# Patient Record
Sex: Female | Born: 1955
Health system: Southern US, Community
[De-identification: ages and names within clinical notes are randomized; demographics above are authoritative.]

## PROBLEM LIST (undated history)

## (undated) VITALS — BP 137/90 | HR 99 | Temp 98.1°F | Resp 16 | Ht 67.0 in | Wt 263.0 lb

## (undated) DIAGNOSIS — M545 Low back pain, unspecified: Secondary | ICD-10-CM

## (undated) DIAGNOSIS — G4730 Sleep apnea, unspecified: Secondary | ICD-10-CM

## (undated) DIAGNOSIS — F32A Depression, unspecified: Secondary | ICD-10-CM

## (undated) DIAGNOSIS — K209 Esophagitis, unspecified without bleeding: Secondary | ICD-10-CM

## (undated) DIAGNOSIS — M797 Fibromyalgia: Secondary | ICD-10-CM

## (undated) DIAGNOSIS — K219 Gastro-esophageal reflux disease without esophagitis: Secondary | ICD-10-CM

## (undated) DIAGNOSIS — G43909 Migraine, unspecified, not intractable, without status migrainosus: Secondary | ICD-10-CM

## (undated) DIAGNOSIS — E785 Hyperlipidemia, unspecified: Secondary | ICD-10-CM

## (undated) DIAGNOSIS — Z9989 Dependence on other enabling machines and devices: Secondary | ICD-10-CM

## (undated) DIAGNOSIS — K3184 Gastroparesis: Secondary | ICD-10-CM

## (undated) DIAGNOSIS — G8929 Other chronic pain: Secondary | ICD-10-CM

## (undated) DIAGNOSIS — F410 Panic disorder [episodic paroxysmal anxiety] without agoraphobia: Secondary | ICD-10-CM

## (undated) DIAGNOSIS — F329 Major depressive disorder, single episode, unspecified: Secondary | ICD-10-CM

## (undated) DIAGNOSIS — M4800 Spinal stenosis, site unspecified: Secondary | ICD-10-CM

## (undated) DIAGNOSIS — F319 Bipolar disorder, unspecified: Secondary | ICD-10-CM

## (undated) DIAGNOSIS — R51 Headache: Secondary | ICD-10-CM

## (undated) DIAGNOSIS — G4733 Obstructive sleep apnea (adult) (pediatric): Secondary | ICD-10-CM

## (undated) DIAGNOSIS — R251 Tremor, unspecified: Secondary | ICD-10-CM

## (undated) DIAGNOSIS — F909 Attention-deficit hyperactivity disorder, unspecified type: Secondary | ICD-10-CM

## (undated) DIAGNOSIS — I10 Essential (primary) hypertension: Secondary | ICD-10-CM

## (undated) DIAGNOSIS — F419 Anxiety disorder, unspecified: Secondary | ICD-10-CM

## (undated) DIAGNOSIS — B3781 Candidal esophagitis: Secondary | ICD-10-CM

## (undated) DIAGNOSIS — E039 Hypothyroidism, unspecified: Secondary | ICD-10-CM

## (undated) DIAGNOSIS — R519 Headache, unspecified: Secondary | ICD-10-CM

## (undated) DIAGNOSIS — K589 Irritable bowel syndrome without diarrhea: Secondary | ICD-10-CM

## (undated) DIAGNOSIS — R7303 Prediabetes: Secondary | ICD-10-CM

## (undated) DIAGNOSIS — M069 Rheumatoid arthritis, unspecified: Secondary | ICD-10-CM

## (undated) DIAGNOSIS — E079 Disorder of thyroid, unspecified: Secondary | ICD-10-CM

## (undated) DIAGNOSIS — R0600 Dyspnea, unspecified: Secondary | ICD-10-CM

## (undated) HISTORY — DX: Spinal stenosis, site unspecified: M48.00

## (undated) HISTORY — DX: Essential (primary) hypertension: I10

## (undated) HISTORY — PX: TUBAL LIGATION: SHX77

## (undated) HISTORY — DX: Esophagitis, unspecified without bleeding: K20.90

## (undated) HISTORY — DX: Gastroparesis: K31.84

## (undated) HISTORY — DX: Major depressive disorder, single episode, unspecified: F32.9

## (undated) HISTORY — PX: LUMBAR DISC SURGERY: SHX700

## (undated) HISTORY — PX: DILATION AND CURETTAGE OF UTERUS: SHX78

## (undated) HISTORY — DX: Irritable bowel syndrome, unspecified: K58.9

## (undated) HISTORY — DX: Candidal esophagitis: B37.81

## (undated) HISTORY — DX: Anxiety disorder, unspecified: F41.9

## (undated) HISTORY — DX: Panic disorder (episodic paroxysmal anxiety): F41.0

## (undated) HISTORY — DX: Esophagitis, unspecified: K20.9

## (undated) HISTORY — DX: Depression, unspecified: F32.A

## (undated) HISTORY — DX: Disorder of thyroid, unspecified: E07.9

## (undated) HISTORY — PX: TONSILLECTOMY: SUR1361

## (undated) HISTORY — PX: BREAST BIOPSY: SHX20

## (undated) HISTORY — DX: Hyperlipidemia, unspecified: E78.5

## (undated) HISTORY — DX: Gastro-esophageal reflux disease without esophagitis: K21.9

---

## 1998-09-05 ENCOUNTER — Other Ambulatory Visit: Admission: RE | Admit: 1998-09-05 | Discharge: 1998-09-05 | Payer: Self-pay | Admitting: *Deleted

## 1998-11-04 ENCOUNTER — Ambulatory Visit (HOSPITAL_COMMUNITY): Admission: RE | Admit: 1998-11-04 | Discharge: 1998-11-04 | Payer: Self-pay | Admitting: Internal Medicine

## 1998-11-04 ENCOUNTER — Encounter: Payer: Self-pay | Admitting: Internal Medicine

## 1998-11-14 ENCOUNTER — Encounter: Payer: Self-pay | Admitting: Internal Medicine

## 1998-11-14 ENCOUNTER — Ambulatory Visit (HOSPITAL_COMMUNITY): Admission: RE | Admit: 1998-11-14 | Discharge: 1998-11-14 | Payer: Self-pay | Admitting: Internal Medicine

## 2000-03-14 ENCOUNTER — Encounter: Admission: RE | Admit: 2000-03-14 | Discharge: 2000-03-14 | Payer: Self-pay | Admitting: *Deleted

## 2000-03-14 ENCOUNTER — Other Ambulatory Visit: Admission: RE | Admit: 2000-03-14 | Discharge: 2000-03-14 | Payer: Self-pay

## 2000-03-14 ENCOUNTER — Encounter (INDEPENDENT_AMBULATORY_CARE_PROVIDER_SITE_OTHER): Payer: Self-pay | Admitting: *Deleted

## 2001-05-04 HISTORY — PX: LAPAROSCOPIC CHOLECYSTECTOMY: SUR755

## 2001-05-24 ENCOUNTER — Encounter (INDEPENDENT_AMBULATORY_CARE_PROVIDER_SITE_OTHER): Payer: Self-pay | Admitting: *Deleted

## 2001-05-24 ENCOUNTER — Inpatient Hospital Stay (HOSPITAL_COMMUNITY): Admission: EM | Admit: 2001-05-24 | Discharge: 2001-05-25 | Payer: Self-pay | Admitting: Emergency Medicine

## 2001-05-24 ENCOUNTER — Encounter: Payer: Self-pay | Admitting: Emergency Medicine

## 2001-12-02 HISTORY — PX: LIPOMA EXCISION: SHX5283

## 2001-12-25 ENCOUNTER — Encounter (INDEPENDENT_AMBULATORY_CARE_PROVIDER_SITE_OTHER): Payer: Self-pay | Admitting: *Deleted

## 2001-12-25 ENCOUNTER — Ambulatory Visit (HOSPITAL_BASED_OUTPATIENT_CLINIC_OR_DEPARTMENT_OTHER): Admission: RE | Admit: 2001-12-25 | Discharge: 2001-12-25 | Payer: Self-pay | Admitting: *Deleted

## 2002-02-03 ENCOUNTER — Encounter: Admission: RE | Admit: 2002-02-03 | Discharge: 2002-02-03 | Payer: Self-pay | Admitting: Family Medicine

## 2002-02-03 ENCOUNTER — Encounter: Payer: Self-pay | Admitting: Family Medicine

## 2002-03-03 ENCOUNTER — Other Ambulatory Visit: Admission: RE | Admit: 2002-03-03 | Discharge: 2002-03-03 | Payer: Self-pay | Admitting: Obstetrics and Gynecology

## 2002-04-24 ENCOUNTER — Encounter: Payer: Self-pay | Admitting: Internal Medicine

## 2002-04-24 ENCOUNTER — Encounter: Admission: RE | Admit: 2002-04-24 | Discharge: 2002-04-24 | Payer: Self-pay | Admitting: Internal Medicine

## 2002-05-17 ENCOUNTER — Emergency Department (HOSPITAL_COMMUNITY): Admission: EM | Admit: 2002-05-17 | Discharge: 2002-05-18 | Payer: Self-pay | Admitting: Physical Therapy

## 2002-07-07 ENCOUNTER — Encounter: Admission: RE | Admit: 2002-07-07 | Discharge: 2002-07-07 | Payer: Self-pay | Admitting: Internal Medicine

## 2002-07-07 ENCOUNTER — Encounter: Payer: Self-pay | Admitting: Internal Medicine

## 2003-06-07 ENCOUNTER — Encounter: Payer: Self-pay | Admitting: Internal Medicine

## 2003-06-07 ENCOUNTER — Ambulatory Visit (HOSPITAL_COMMUNITY): Admission: RE | Admit: 2003-06-07 | Discharge: 2003-06-07 | Payer: Self-pay | Admitting: Internal Medicine

## 2003-10-27 ENCOUNTER — Emergency Department (HOSPITAL_COMMUNITY): Admission: EM | Admit: 2003-10-27 | Discharge: 2003-10-28 | Payer: Self-pay | Admitting: Emergency Medicine

## 2004-04-20 ENCOUNTER — Encounter: Admission: RE | Admit: 2004-04-20 | Discharge: 2004-04-20 | Payer: Self-pay | Admitting: Family Medicine

## 2004-04-25 ENCOUNTER — Other Ambulatory Visit: Admission: RE | Admit: 2004-04-25 | Discharge: 2004-04-25 | Payer: Self-pay | Admitting: Obstetrics and Gynecology

## 2004-05-05 ENCOUNTER — Encounter (INDEPENDENT_AMBULATORY_CARE_PROVIDER_SITE_OTHER): Payer: Self-pay | Admitting: Specialist

## 2004-05-05 ENCOUNTER — Encounter: Admission: RE | Admit: 2004-05-05 | Discharge: 2004-05-05 | Payer: Self-pay | Admitting: Family Medicine

## 2005-02-16 ENCOUNTER — Encounter: Admission: RE | Admit: 2005-02-16 | Discharge: 2005-02-16 | Payer: Self-pay | Admitting: Sports Medicine

## 2005-03-09 ENCOUNTER — Encounter: Admission: RE | Admit: 2005-03-09 | Discharge: 2005-03-09 | Payer: Self-pay | Admitting: Sports Medicine

## 2005-03-26 ENCOUNTER — Ambulatory Visit: Payer: Self-pay | Admitting: Family Medicine

## 2005-05-24 ENCOUNTER — Other Ambulatory Visit: Admission: RE | Admit: 2005-05-24 | Discharge: 2005-05-24 | Payer: Self-pay | Admitting: Obstetrics and Gynecology

## 2005-05-24 ENCOUNTER — Encounter: Admission: RE | Admit: 2005-05-24 | Discharge: 2005-05-24 | Payer: Self-pay | Admitting: Family Medicine

## 2005-06-15 ENCOUNTER — Encounter: Admission: RE | Admit: 2005-06-15 | Discharge: 2005-06-15 | Payer: Self-pay | Admitting: Family Medicine

## 2005-12-28 ENCOUNTER — Ambulatory Visit: Payer: Self-pay | Admitting: Internal Medicine

## 2006-01-08 ENCOUNTER — Ambulatory Visit: Payer: Self-pay | Admitting: Internal Medicine

## 2006-01-24 ENCOUNTER — Ambulatory Visit (HOSPITAL_BASED_OUTPATIENT_CLINIC_OR_DEPARTMENT_OTHER): Admission: RE | Admit: 2006-01-24 | Discharge: 2006-01-24 | Payer: Self-pay | Admitting: Orthopedic Surgery

## 2006-01-24 HISTORY — PX: KNEE ARTHROSCOPY: SHX127

## 2006-03-06 ENCOUNTER — Emergency Department (HOSPITAL_COMMUNITY): Admission: EM | Admit: 2006-03-06 | Discharge: 2006-03-06 | Payer: Self-pay | Admitting: Emergency Medicine

## 2006-07-11 ENCOUNTER — Encounter: Admission: RE | Admit: 2006-07-11 | Discharge: 2006-07-11 | Payer: Self-pay | Admitting: Family Medicine

## 2007-04-03 ENCOUNTER — Other Ambulatory Visit: Admission: RE | Admit: 2007-04-03 | Discharge: 2007-04-03 | Payer: Self-pay | Admitting: Internal Medicine

## 2007-08-14 ENCOUNTER — Encounter: Admission: RE | Admit: 2007-08-14 | Discharge: 2007-08-14 | Payer: Self-pay | Admitting: Internal Medicine

## 2007-08-15 ENCOUNTER — Ambulatory Visit: Payer: Self-pay | Admitting: Internal Medicine

## 2007-08-19 ENCOUNTER — Ambulatory Visit: Payer: Self-pay | Admitting: Internal Medicine

## 2007-08-19 ENCOUNTER — Encounter: Payer: Self-pay | Admitting: Internal Medicine

## 2007-09-09 ENCOUNTER — Encounter: Admission: RE | Admit: 2007-09-09 | Discharge: 2007-09-09 | Payer: Self-pay | Admitting: Neurosurgery

## 2007-10-06 ENCOUNTER — Telehealth: Payer: Self-pay | Admitting: Internal Medicine

## 2008-03-29 ENCOUNTER — Other Ambulatory Visit: Admission: RE | Admit: 2008-03-29 | Discharge: 2008-03-29 | Payer: Self-pay | Admitting: Internal Medicine

## 2008-08-27 ENCOUNTER — Encounter: Admission: RE | Admit: 2008-08-27 | Discharge: 2008-08-27 | Payer: Self-pay | Admitting: Internal Medicine

## 2008-11-08 ENCOUNTER — Emergency Department (HOSPITAL_COMMUNITY): Admission: EM | Admit: 2008-11-08 | Discharge: 2008-11-08 | Payer: Self-pay | Admitting: Emergency Medicine

## 2008-12-02 HISTORY — PX: POSTERIOR LUMBAR FUSION: SHX6036

## 2008-12-24 ENCOUNTER — Inpatient Hospital Stay (HOSPITAL_COMMUNITY): Admission: RE | Admit: 2008-12-24 | Discharge: 2008-12-29 | Payer: Self-pay | Admitting: Neurosurgery

## 2009-03-04 ENCOUNTER — Telehealth: Payer: Self-pay | Admitting: Internal Medicine

## 2009-03-08 DIAGNOSIS — Z8719 Personal history of other diseases of the digestive system: Secondary | ICD-10-CM | POA: Insufficient documentation

## 2009-03-08 DIAGNOSIS — M4800 Spinal stenosis, site unspecified: Secondary | ICD-10-CM | POA: Insufficient documentation

## 2009-03-08 DIAGNOSIS — F410 Panic disorder [episodic paroxysmal anxiety] without agoraphobia: Secondary | ICD-10-CM | POA: Insufficient documentation

## 2009-03-08 DIAGNOSIS — K219 Gastro-esophageal reflux disease without esophagitis: Secondary | ICD-10-CM | POA: Insufficient documentation

## 2009-03-08 DIAGNOSIS — E039 Hypothyroidism, unspecified: Secondary | ICD-10-CM | POA: Insufficient documentation

## 2009-03-08 DIAGNOSIS — I10 Essential (primary) hypertension: Secondary | ICD-10-CM | POA: Insufficient documentation

## 2009-03-08 DIAGNOSIS — F329 Major depressive disorder, single episode, unspecified: Secondary | ICD-10-CM | POA: Insufficient documentation

## 2009-03-08 DIAGNOSIS — B3781 Candidal esophagitis: Secondary | ICD-10-CM | POA: Insufficient documentation

## 2009-03-09 ENCOUNTER — Ambulatory Visit: Payer: Self-pay | Admitting: Internal Medicine

## 2009-03-10 ENCOUNTER — Encounter: Admission: RE | Admit: 2009-03-10 | Discharge: 2009-04-21 | Payer: Self-pay | Admitting: Neurosurgery

## 2009-03-31 ENCOUNTER — Other Ambulatory Visit: Admission: RE | Admit: 2009-03-31 | Discharge: 2009-03-31 | Payer: Self-pay | Admitting: Internal Medicine

## 2009-09-12 ENCOUNTER — Telehealth: Payer: Self-pay | Admitting: Internal Medicine

## 2009-10-10 ENCOUNTER — Ambulatory Visit: Payer: Self-pay | Admitting: Internal Medicine

## 2009-10-11 LAB — CONVERTED CEMR LAB
BUN: 13 mg/dL (ref 6–23)
TSH: 1.77 microintl units/mL (ref 0.35–5.50)
Total Bilirubin: 0.4 mg/dL (ref 0.3–1.2)

## 2009-10-13 ENCOUNTER — Ambulatory Visit: Payer: Self-pay | Admitting: Internal Medicine

## 2009-10-13 ENCOUNTER — Telehealth: Payer: Self-pay | Admitting: Internal Medicine

## 2010-03-29 ENCOUNTER — Encounter: Admission: RE | Admit: 2010-03-29 | Discharge: 2010-03-29 | Payer: Self-pay | Admitting: Internal Medicine

## 2010-04-10 ENCOUNTER — Other Ambulatory Visit: Admission: RE | Admit: 2010-04-10 | Discharge: 2010-04-10 | Payer: Self-pay | Admitting: Internal Medicine

## 2010-06-04 HISTORY — PX: CARDIAC CATHETERIZATION: SHX172

## 2010-06-24 ENCOUNTER — Encounter: Payer: Self-pay | Admitting: Family Medicine

## 2010-06-25 ENCOUNTER — Encounter: Payer: Self-pay | Admitting: Neurosurgery

## 2010-06-25 ENCOUNTER — Encounter: Payer: Self-pay | Admitting: Family Medicine

## 2010-07-06 NOTE — Progress Notes (Signed)
Summary: Triage  Medications Added ZOFRAN 4 MG  TABS (ONDANSETRON HCL) Take 1 every 6-8 hours as needed for nausea.       Phone Note Call from Patient Call back at [phone]   Caller: Patient Call For: Dr. Juanda Chance Reason for Call: Talk to Nurse Summary of Call: pt is nauseated  Initial call taken by: Karna Christmas,  September 12, 2009 9:53 AM  Follow-up for Phone Call        Last OV 03-09-09. Doesn't take Prilosec. Takes Reglan three times a day.  No Vicodin, just Tramadol. Pt. c/o 3 weeks of nausea, early satiety, bloating and "Sometimes I end up in the bed all day just eating saltine crackers"   DR.Chelcie Estorga PLEASE ADVISE  Follow-up by: Laureen Ochs LPN,  September 12, 2009 1:01 PM  Additional Follow-up for Phone Call Additional follow up Details #1::        I have reviewed her chart. Overweight, hx depression, last GES normal. please send Zofran 4mg ,#30, 1 by mouth q 6-8 hrs as needed nausea Additional Follow-up by: Hart Carwin MD,  September 12, 2009 3:28 PM    Additional Follow-up for Phone Call Additional follow up Details #2::    Above MD orders reviewed with patient. Pt. to keep scheduled office visit. 10-10-09 at 10:15am. Pt. instructed to call back as needed.   Laureen Ochs LPN  September 12, 2009 3:30 PM   New/Updated Medications: ZOFRAN 4 MG  TABS (ONDANSETRON HCL) Take 1 every 6-8 hours as needed for nausea. Prescriptions: ZOFRAN 4 MG  TABS (ONDANSETRON HCL) Take 1 every 6-8 hours as needed for nausea.  #30 x 0   Entered by:   Laureen Ochs LPN   Authorized by:   Hart Carwin MD   Signed by:   Laureen Ochs LPN on 04/54/0981   Method used:   Electronically to        [facility] [address] #[id number]* (retail)       [address]       [place], [place]  [address]       Ph: [phone]       Fax: [phone]   RxID:   [id number]

## 2010-07-06 NOTE — Progress Notes (Signed)
Summary: TRIAGE   Phone Note Call from Patient Call back at Home Phone [phone]   Caller: Patient Summary of Call: Patient would like to let Dr. Juanda Chance to know that her insurance does not cover any weight loss surgical procedures, Plus she lost her job in Jan., so she does not feel like she will be able to have any procedure done about her weight.  She would like to know what else she is supposed to do? Initial call taken by: Harlow Mares CMA Duncan Dull),  Oct 13, 2009 2:42 PM  Follow-up for Phone Call        DR.Rosa Gambale PLEASE ADVISE  Follow-up by: Laureen Ochs LPN,  Oct 13, 2009 2:53 PM  Additional Follow-up for Phone Call Additional follow up Details #1::        I told her we would try  Phenergan 12.5 mg, # 30 1 by mouth q 6 hrs as needed. Additional Follow-up by: Hart Carwin MD,  Oct 13, 2009 5:50 PM    Additional Follow-up for Phone Call Additional follow up Details #2::    I have spoken to the patient and have given her the possible option of sending her to a nutritionist. She states, "I know what to eat and how much to eat, I just dont have the willpower to stay away from stuff." Patient also explained that she was "up crying all last night" because her insurance will not cover a weight loss procedure. I have also asked her to follow up as directed with Dr Allena Katz re: her depression. She says that Dr Juanda Chance mentioned possibly using an appetite suppresant. What are your thoughts on this? Dottie Nelson-Smith CMA Duncan Dull)  Oct 14, 2009 9:43 AM   Additional Follow-up for Phone Call Additional follow up Details #3:: Details for Additional Follow-up Action Taken: Yes. we can try [place] Dospan 75 mg po once daily, # 30 , 2 refills. Additional Follow-up by: Hart Carwin MD,  Oct 14, 2009 12:33 PM   Appended Document: TRIAGE Patient advised that we have sent appetite suppresant to pharmacy. Dottie Nelson-Smith CMA (AAMA)  Oct 14, 2009 1:15 PM    Clinical Lists  Changes  Medications: Added new medication of * TENNUATE DOSPAN 75 MG TABLET Take 1 tablet by mouth once daily - Signed Rx of TENNUATE DOSPAN 75 MG TABLET Take 1 tablet by mouth once daily;  #30 x 2;  Signed;  Entered by: Lamona Curl CMA (AAMA);  Authorized by: Hart Carwin MD;  Method used: Printed then faxed to [facility] #[id number]*, [address] [place], [place], [place]  [address], Ph: [phone], Fax: [phone]    Prescriptions: TENNUATE DOSPAN 75 MG TABLET Take 1 tablet by mouth once daily  #30 x 2   Entered by:   Lamona Curl CMA (AAMA)   Authorized by:   Hart Carwin MD   Signed by:   Lamona Curl CMA (AAMA) on 10/14/2009   Method used:   Printed then faxed to ...       [facility] #[id number]* (retail)       [address]       [place], [place]  [address]       Ph: [phone]       Fax: [phone]   RxID:   [id number]

## 2010-07-06 NOTE — Assessment & Plan Note (Signed)
Summary: nausea////em   History of Present Illness Visit Type: Follow-up Visit Primary GI MD: Lina Sar MD Primary Provider: Renford Dills, MD Chief Complaint: ongoing nausea, pt is taking metoclopramide.  Pt states she ate ice cream yesterday and this upset her stomach History of Present Illness:   This is a 55 year old, white female with chronic nausea of at least 12 years duration. She takes Zofran for the nausea. A gastric emptying scan in 2000 showed a 90% retention in 2 hours. A repeat gastric emptying scan in 2005 was normal. She has depression and chronic weight gain. She has decreased her pain medications from hydrocodone  to tramadol 3 times a day. An upper endoscopy showed Candida esophagitis in March 2009. There is a history of a remote cholecystectomy in 2000. A colonoscopy in August 2007 was essentially normal. Patient is considering lapband surgery for obesity.   GI Review of Systems    Reports nausea.      Denies abdominal pain, acid reflux, belching, bloating, chest pain, dysphagia with liquids, dysphagia with solids, heartburn, loss of appetite, vomiting, vomiting blood, weight loss, and  weight gain.        Denies anal fissure, black tarry stools, change in bowel habit, constipation, diarrhea, diverticulosis, fecal incontinence, heme positive stool, hemorrhoids, irritable bowel syndrome, jaundice, light color stool, liver problems, rectal bleeding, and  rectal pain.    Current Medications (verified): 1)  Triamterene-Hctz 75-50 Mg Tabs (Triamterene-Hctz) .... Take 1/2 Tablet Daily 2)  Levothyroxine Sodium 100 Mcg Tabs (Levothyroxine Sodium) .... Once Daily 3)  Seroquel 50 Mg Tabs (Quetiapine Fumarate) .... Two Times A Day 4)  Ambien 10 Mg Tabs (Zolpidem Tartrate) .... Take 1 Tablet At Bedtime 5)  Multivitamins  Tabs (Multiple Vitamin) .... Once Daily 6)  Prilosec 20 Mg Cpdr (Omeprazole) .... Two Times A Day 7)  Tramadol Hcl 50 Mg Tabs (Tramadol Hcl) .... Take 1 Tablet  By Mouth Three Times A Day As Needed For Pain 8)  Reglan 10 Mg Tabs (Metoclopramide Hcl) .... Take 1 Tablet By Mouth Before Each Meal (Three Times Daily) 9)  Zofran 4 Mg  Tabs (Ondansetron Hcl) .... Take 1 Every 6-8 Hours As Needed For Nausea. 10)  Xanax 1 Mg Tabs (Alprazolam) .... Take 1 Tablet By Mouth Three Times A Day 11)  Aspirin 81 Mg Tabs (Aspirin) .... Take 1 Tablet By Mouth Once A Day 12)  Cymbalta 30 Mg Cpep (Duloxetine Hcl) .... Take 1 Tablet By Mouth Once A Day 13)  Vitamin B-12 100 Mcg Tabs (Cyanocobalamin) .... Take 1 Tablet By Mouth Once A Day  Allergies (verified): 1)  ! Buspar 2)  ! Zoloft  Past History:  Past Medical History: G3P102 1 SPINAL STENOSIS (ICD-724.00) Hx of CANDIDIASIS, ESOPHAGEAL (ICD-112.84) IRRITABLE BOWEL SYNDROME, HX OF (ICD-V12.79) DEPRESSION (ICD-311) PANIC DISORDER (ICD-300.01) HYPOTHYROIDISM (ICD-244.9) HYPERTENSION (ICD-401.9) GERD (ICD-530.81) NAUSEA (ICD-787.02)    Past Surgical History: Reviewed history from 03/09/2009 and no changes required. Cholecystectomy  low back surgery Tubal Ligation Breast Biopsy Tonsillectomy Spinal fusion 2010  Family History: Reviewed history from 03/08/2009 and no changes required. brother- type II DM, father died w/ high chol, HTN, mother died of leukemia, stroke Family History of Liver Disease/Cirrhosis: Father (liver failure) No FH of Colon Cancer: Family History of Heart Disease:  MGM, Brother, Father Family History of Crohn's: Father Family History of Diabetes: Brother, Actor  Social History: Divorced x 1. Lives w/ Marcy Salvo- current husband.   Has 1 child, 6 grandchildren.   Quit smoking 15 yrs  ago.   Does not exercise.   Unemployed, lost job in Jan 2011 Alcohol Use - no Illicit Drug Use - no  Review of Systems  The patient denies allergy/sinus, anemia, anxiety-new, arthritis/joint pain, back pain, blood in urine, breast changes/lumps, confusion, cough, coughing up blood,  depression-new, fainting, fatigue, fever, headaches-new, hearing problems, heart murmur, heart rhythm changes, itching, menstrual pain, muscle pains/cramps, night sweats, nosebleeds, pregnancy symptoms, shortness of breath, skin rash, sleeping problems, sore throat, swelling of feet/legs, swollen lymph glands, thirst - excessive, urination - excessive, urination changes/pain, urine leakage, vision changes, and voice change.         Pertinent positive and negative review of systems were noted in the above HPI. All other ROS was otherwise negative.   Vital Signs:  Patient profile:   56 year old female Height:      68 inches Weight:      249 pounds BMI:     38.00 Pulse rate:   68 / minute Pulse rhythm:   regular BP sitting:   100 / 74  (left arm) Cuff size:   large  Vitals Entered By: Francee Piccolo CMA Duncan Dull) (Oct 10, 2009 10:28 AM)  Physical Exam  Eyes:  PERRLA, no icterus. Mouth:  No deformity or lesions, dentition normal. Lungs:  Clear throughout to auscultation. Heart:  Regular rate and rhythm; no murmurs, rubs,  or bruits. Abdomen:  obese, protuberant abdomen with normoactive bowel sounds and minimal tenderness in the epigastrium. Lower abdomen is unremarkable. Msk:  Symmetrical with no gross deformities. Normal posture. Extremities:  No clubbing, cyanosis, edema or deformities noted. Skin:  Intact without significant lesions or rashes. Psych:  Alert and cooperative. Normal mood and affect.   Impression & Recommendations:  Problem # 1:  IRRITABLE BOWEL SYNDROME, HX OF (ICD-V12.79) Patient has functional dyspepsia and irritable bowel syndrome causing chronic nausea. She has a history of gastroparesis  which could  be  not reproduced on her last gastric emptying scan. She would be an appropriate candidate for surgery for morbid obesity because of her hypertension and cardiovascular  risks. We will give her a refill for Zofran and have her to continue Reglan 10 mg 3 times a day.  We will also proceed with a CT scan of the abdomen and pelvis to rule out other pathology.  Problem # 2:  DEPRESSION (ICD-311) Patient is followed by Dr Nehemiah Settle and takes Seroquel 50 mg twice a day and Cymbalta 30 mg once a day.  Other Orders: TLB-BUN (Urea Nitrogen) (84520-BUN) TLB-Creatinine, Blood (82565-CREA) CT Abdomen/Pelvis with Contrast (CT Abd/Pelvis w/con) TLB-TSH (Thyroid Stimulating Hormone) (84443-TSH) TLB-Hepatic/Liver Function Pnl (80076-HEPATIC)  Patient Instructions: 1)  CT scan of the abdomen and pelvis. 2)  TSH and liver function tests today. 3)  Refills for Zofran. 4)  Patient is to call [facility] to get nformation on Lapband procuedure for morbid obesity. She has been given the number to that office. 5)  Continue Reglan and Prilosec 20 mg twice a day. 6)  Copy sent to : Dr Nehemiah Settle 7)  The medication list was reviewed and reconciled.  All changed / newly prescribed medications were explained.  A complete medication list was provided to the patient / caregiver. Prescriptions: ZOFRAN 4 MG  TABS (ONDANSETRON HCL) Take 1 every 6-8 hours as needed for nausea.  #30 x 0   Entered by:   Lamona Curl CMA (AAMA)   Authorized by:   Hart Carwin MD   Signed by:  Dottie Nelson-Smith CMA (AAMA) on 10/10/2009   Method used:   Electronically to        [facility] #[id number]* (retail)       [address]       [place], [place]  [address]       Ph: [phone]       Fax: [phone]   RxID:   [id number]

## 2010-08-09 ENCOUNTER — Observation Stay (HOSPITAL_COMMUNITY)
Admission: AD | Admit: 2010-08-09 | Discharge: 2010-08-14 | Disposition: A | Discharge status: Home / Self Care | Payer: BC Managed Care – PPO | Source: Other Acute Inpatient Hospital | Attending: Internal Medicine | Admitting: Internal Medicine

## 2010-08-09 ENCOUNTER — Emergency Department (INDEPENDENT_AMBULATORY_CARE_PROVIDER_SITE_OTHER): Payer: BC Managed Care – PPO

## 2010-08-09 ENCOUNTER — Emergency Department (HOSPITAL_BASED_OUTPATIENT_CLINIC_OR_DEPARTMENT_OTHER)
Admission: EM | Admit: 2010-08-09 | Discharge: 2010-08-09 | Disposition: A | Discharge status: Nursing Home (Includes ICF) | Payer: BC Managed Care – PPO | Source: Home / Self Care | Attending: Emergency Medicine | Admitting: Emergency Medicine

## 2010-08-09 DIAGNOSIS — R079 Chest pain, unspecified: Secondary | ICD-10-CM

## 2010-08-09 DIAGNOSIS — M79609 Pain in unspecified limb: Secondary | ICD-10-CM | POA: Insufficient documentation

## 2010-08-09 DIAGNOSIS — M4800 Spinal stenosis, site unspecified: Secondary | ICD-10-CM | POA: Insufficient documentation

## 2010-08-09 DIAGNOSIS — G4700 Insomnia, unspecified: Secondary | ICD-10-CM | POA: Insufficient documentation

## 2010-08-09 DIAGNOSIS — F411 Generalized anxiety disorder: Secondary | ICD-10-CM | POA: Insufficient documentation

## 2010-08-09 DIAGNOSIS — I10 Essential (primary) hypertension: Secondary | ICD-10-CM | POA: Insufficient documentation

## 2010-08-09 DIAGNOSIS — F410 Panic disorder [episodic paroxysmal anxiety] without agoraphobia: Secondary | ICD-10-CM | POA: Insufficient documentation

## 2010-08-09 DIAGNOSIS — M25519 Pain in unspecified shoulder: Secondary | ICD-10-CM | POA: Insufficient documentation

## 2010-08-09 DIAGNOSIS — E669 Obesity, unspecified: Secondary | ICD-10-CM | POA: Insufficient documentation

## 2010-08-09 DIAGNOSIS — E039 Hypothyroidism, unspecified: Secondary | ICD-10-CM | POA: Insufficient documentation

## 2010-08-09 DIAGNOSIS — K219 Gastro-esophageal reflux disease without esophagitis: Secondary | ICD-10-CM | POA: Insufficient documentation

## 2010-08-09 DIAGNOSIS — M542 Cervicalgia: Secondary | ICD-10-CM | POA: Insufficient documentation

## 2010-08-09 DIAGNOSIS — G4730 Sleep apnea, unspecified: Secondary | ICD-10-CM | POA: Insufficient documentation

## 2010-08-09 DIAGNOSIS — Z79899 Other long term (current) drug therapy: Secondary | ICD-10-CM | POA: Insufficient documentation

## 2010-08-09 DIAGNOSIS — F319 Bipolar disorder, unspecified: Secondary | ICD-10-CM | POA: Insufficient documentation

## 2010-08-09 LAB — URINALYSIS, ROUTINE W REFLEX MICROSCOPIC
Hgb urine dipstick: NEGATIVE
Nitrite: NEGATIVE
Specific Gravity, Urine: 1.015 (ref 1.005–1.030)
Urobilinogen, UA: 0.2 mg/dL (ref 0.0–1.0)

## 2010-08-09 LAB — BASIC METABOLIC PANEL
Chloride: 105 mEq/L (ref 96–112)
Creatinine, Ser: 0.6 mg/dL (ref 0.4–1.2)
GFR calc Af Amer: >60 mL/min (ref >60)
Potassium: 3.7 mEq/L (ref 3.5–5.1)

## 2010-08-09 LAB — CARDIAC PANEL(CRET KIN+CKTOT+MB+TROPI)
CK, MB: 0.5 ng/mL (ref 0.3–4.0)
Troponin I: <0.01 ng/mL (ref 0.00–0.06)

## 2010-08-09 LAB — DIFFERENTIAL
Basophils Relative: 1 % (ref 0–1)
Eosinophils Absolute: 0.1 10*3/uL (ref 0.0–0.7)
Neutrophils Relative %: 57 % (ref 43–77)

## 2010-08-09 LAB — CBC
Platelets: 353 10*3/uL (ref 150–400)
RBC: 5 MIL/uL (ref 3.87–5.11)
WBC: 6.4 10*3/uL (ref 4.0–10.5)

## 2010-08-09 LAB — D-DIMER, QUANTITATIVE: D-Dimer, Quant: 0.31 ug/mL-FEU (ref 0.00–0.48)

## 2010-08-09 LAB — POCT CARDIAC MARKERS
CKMB, poc: <1 ng/mL — ABNORMAL LOW (ref 1.0–8.0)
Myoglobin, poc: 54.2 ng/mL (ref 12–200)
Troponin i, poc: <0.05 ng/mL (ref 0.00–0.09)

## 2010-08-09 LAB — URINE MICROSCOPIC-ADD ON

## 2010-08-09 LAB — TSH: TSH: 1.926 u[IU]/mL (ref 0.350–4.500)

## 2010-08-10 ENCOUNTER — Inpatient Hospital Stay (HOSPITAL_COMMUNITY): Payer: BC Managed Care – PPO

## 2010-08-10 LAB — CBC
HCT: 39.9 % (ref 36.0–46.0)
Hemoglobin: 13.8 g/dL (ref 12.0–15.0)
MCH: 29.4 pg (ref 26.0–34.0)
MCV: 84.9 fL (ref 78.0–100.0)
RBC: 4.7 MIL/uL (ref 3.87–5.11)

## 2010-08-10 LAB — LIPID PANEL
Cholesterol: 218 mg/dL — ABNORMAL HIGH (ref 0–200)
HDL: 44 mg/dL (ref >39)
LDL Cholesterol: 138 mg/dL — ABNORMAL HIGH (ref 0–99)
Total CHOL/HDL Ratio: 5 RATIO

## 2010-08-10 LAB — BASIC METABOLIC PANEL
BUN: 9 mg/dL (ref 6–23)
CO2: 27 mEq/L (ref 19–32)
Chloride: 103 mEq/L (ref 96–112)
Creatinine, Ser: 0.69 mg/dL (ref 0.4–1.2)
Glucose, Bld: 95 mg/dL (ref 70–99)
Potassium: 3.6 mEq/L (ref 3.5–5.1)

## 2010-08-10 LAB — CARDIAC PANEL(CRET KIN+CKTOT+MB+TROPI)
CK, MB: 0.5 ng/mL (ref 0.3–4.0)
Relative Index: INVALID (ref 0.0–2.5)

## 2010-08-10 NOTE — H&P (Signed)
Christine Robles, Christine Robles                ACCOUNT NO.:  [id number]  MEDICAL RECORD NO.:  [id number]           PATIENT TYPE:  I  LOCATION:  2021                         FACILITY:  [facility]  PHYSICIAN:  Vania Rea, M.D. DATE OF BIRTH:  1956/02/20  DATE OF ADMISSION:  08/09/2010 DATE OF DISCHARGE:                             HISTORY & PHYSICAL   PRIMARY CARE PHYSICIAN:  Renford Dills, MD.  CARDIOLOGIST:  Georga Hacking, MD.  CHIEF COMPLAINT:  Chest pain since this morning.  HISTORY OF PRESENT ILLNESS:  This is a 55 year old obese Caucasian lady with a history of hypertension, bipolar disorder, panic disorders, degenerative joint disease, who has been having a chronic left shoulder ache for the past 2 weeks.  Shoulder pain is usually aggravated by movement, but she drove to the hospital in a panic this morning because of the sudden onset of pain radiating from her left shoulder down into the left arm and also into the left side of her chest and up into her neck.  She does describe the pain as sharp and like sometimes of spasms and colic.  She has chronic nausea.  Her nausea did not get worse during these episodes.  She did not have any vomiting or dizziness.  She was having palpitations, but she has baseline episodic palpitations because of her panic disorders.  The patient says that is worse.  The pain was 7- 8/10, but it is decreased about 5/10 by the time she got to the hospital and after getting sublingual nitroglycerin and aspirin, the pain subsided considerably.  She also received a dose of intravenous Ativan later, which also made her feel much better.  She gives no history of paroxysmal nocturnal dyspnea nor lower extremity edema.  She does not exercise.  She does have obstructive sleep apnea and wears CPAP.  Her father died of myocardial infarction in his 70s.  She had a brother, who died of cerebral hemorrhage at the age 70, however, it was felt to be a complication of  his leukemia.  PAST MEDICAL HISTORY:  Hypertension, obstructive sleep apnea, bipolar disorder, depression, panic disorder, hypothyroidism, hypertension, GERD, chronic nausea, spinal stenosis.  PAST SURGICAL HISTORY:  Includes low back surgery x2, left knee arthroscopy several years ago, cholecystectomy, tubal ligation, tonsillectomy, and breast biopsy.  FAMILY HISTORY:  As noted above, significant for father who died of a heart attack, also had hypertension and hyperlipidemia.  Mother who died of leukemia and a stroke at age 55.  A brother who had diabetes type 2 and had a pacemaker placed at age 54 because of syncopal episodes.  REVIEW OF SYSTEMS:  Other than noted above unremarkable.  MEDICATIONS: 1. Triamterene 75/50 half tablet daily. 2. Levothyroxine 100 mcg daily. 3. Seroquel 50 mg twice daily. 4. Ambien 10 mg at bedtime. 5. Multivitamins daily. 6. Calcium. 7. Magnesium and vitamin D daily. 8. Red yeast rice daily. 9. Prilosec 20 mg twice daily. 10.Reglan 10 mg three times daily before meals. 11.Zofran 4 mg p.r.n. for nausea. 12.Xanax 1 mg three times daily. 13.Aspirin 81 mg daily. 14.Cymbalta 30 mg daily.  ALLERGIES:  BUSPAR AND  ZOLOFT.  SOCIAL HISTORY:  She is divorced x2.  She is here with her third husband.  She has one child and six grandchildren.  She quit smoking 15 years ago.  Does not exercise.  She has not worked for over a year.  She is an unemployed Copywriter, advertising.  Denies alcohol or illicit drug use.  FAMILY HISTORY:  As noted above.  PHYSICAL EXAM:  GENERAL:  A very pleasant middle-aged obese Caucasian lady, reclining in the bed, appears somewhat apprehensive, but is not physically distressed. VITALS:  Temperature is 98.1, pulse 76, respirations 18, blood pressure 127/87, she is saturating at 96% on 2 L. HEENT:  Her pupils are round and equal.  Mucous membranes pink. Anicteric. NECK:  No cervical lymphadenopathy or thyromegaly.  She does have a  very full Neck.  No carotid bruit.  Jugular venous distention is elevated to about 2 cm below the manubrium sternum. CHEST:  Clear to auscultation bilaterally. CARDIOVASCULAR SYSTEM:  Regular rhythm, 1/6 systolic murmur. ABDOMEN:  Massive, is obese, soft, and nontender.  No masses. EXTREMITIES:  Without edema.  She has 2+ dorsalis pedis pulses bilaterally. SKIN:  Cool and dry.  No ulcerations. CENTRAL NERVOUS SYSTEM:  Cranial nerves II through XII are grossly intact.  She has no focal lateralizing sign.  LABORATORY DATA:  Her first set of cardiac enzymes show undetectable CK- MB and troponins and a myoglobin of 54.  Her white count is 6.4, hemoglobin 14.2, platelet 353.  She has a normal differential.  Her sodium is 141, potassium 3.7, chloride 105, CO2 of 26, glucose 110, BUN 13, creatinine 0.6, calcium 9.4.  Her D-dimer was normal at 0.3.  Two- view chest x-ray showed minimal scarring at the left base.  No active cardiopulmonary disease.  Her EKG shows normal sinus rhythm with no ST- segment abnormalities.  ASSESSMENT: 1. Musculoskeletal-type chest pains, surprisingly got some relief with     nitroglycerin. 2. Hypertension. 3. Hypothyroidism. 4. History of panic attacks. 5. Bipolar disorder and depression. 6. Obesity. 7. Obstructive sleep apnea. 8. Degenerative joint disease. 9. Osteoarthritis, left shoulder.  PLAN:  We will admit this lady on observation and cycle her cardiac enzymes and consult her cardiologist for assistance with management. She will be followed up by her primary care physician, Dr. Renford Dills in the morning.  Of note, the patient's pain is likely musculoskeletal in origin and the resolution with nitroglycerin may be related to her GERD.  However, given her obesity, hypertension, and the fact that women sometimes can give atypical presentation.  We feel it is wise to bring on observation and rule out an acute myocardial event.  The complete plans as  per orders.     Vania Rea, M.D.     LC/MEDQ  D:  08/09/2010  T:  08/09/2010  Job:  [id number]  cc:   Renford Dills, MD Georga Hacking, M.D. Corky Crafts, MD  Electronically Signed by Vania Rea M.D. on 08/10/2010 02:37:11 AM

## 2010-08-11 ENCOUNTER — Inpatient Hospital Stay (HOSPITAL_COMMUNITY): Payer: BC Managed Care – PPO

## 2010-08-11 MED ORDER — TECHNETIUM TC 99M TETROFOSMIN IV KIT
10.0000 | PACK | Freq: Once | INTRAVENOUS | Status: AC | PRN
  Administered 2010-08-11: 10 via INTRAVENOUS

## 2010-08-11 MED ORDER — TECHNETIUM TC 99M TETROFOSMIN IV KIT
30.0000 | PACK | Freq: Once | INTRAVENOUS | Status: AC | PRN
  Administered 2010-08-11: 30 via INTRAVENOUS

## 2010-08-11 NOTE — Consult Note (Signed)
NAMEKADEE, PHILYAW                ACCOUNT NO.:  [id number]  MEDICAL RECORD NO.:  [id number]           PATIENT TYPE:  I  LOCATION:  2021                         FACILITY:  [facility]  PHYSICIAN:  Corky Crafts, MDDATE OF BIRTH:  Jun 26, 1955  DATE OF CONSULTATION:  08/09/2010 DATE OF DISCHARGE:                                CONSULTATION   PRIMARY CARDIOLOGIST:  Georga Hacking, MD  PRIMARY CARE PHYSICIAN:  Deirdre Peer. Polite, MD  REASON FOR CONSULTATION:  Chest pain.  HISTORY OF PRESENT ILLNESS:  The patient is a 55 year old woman who has several risk factors for coronary artery disease.  She was in her usual state of health until this morning when she began to have left shoulder pain that began to radiate down her left arm.  It was intermittent and worse with moving her left arm.  There was no associated diaphoresis or nausea.  Later on in the morning, she had some sharp pain on her left breast.  This was like a pain she has never had before, neither pain was related to exertion.  She was concerned and went to the [facility].  She was subsequently transferred to [facility] for further evaluation.  We are asked to consult regarding her chest pain.  ALLERGIES: 1. BUSPAR. 2. LEXAPRO.  HOME MEDICATIONS: 1. Maxzide 37.5/25. 2. Levothyroxine 100 mcg daily. 3. Seroquel. 4. Ambien. 5. Multivitamin. 6. Calcium. 7. Magnesium. 8. Red yeast rice. 9. Prilosec. 10.Reglan. 11.Zofran. 12.Xanax. 13.Aspirin 81 mg daily. 14.Cymbalta.  SOCIAL HISTORY:  She does not smoke.  She quit smoking many years ago. She does not exercise.  She does not use alcohol.  FAMILY HISTORY:  Significant for father had an MI in his 66s.  Mother had a stroke in her 24s.  PAST MEDICAL HISTORY: 1. Hypertension. 2. Obstructive sleep apnea. 3. Bipolar disorder. 4. Depression. 5. Panic disorder. 6. Hypertension. 7. Hypothyroidism. 8. Spinal stenosis. 9. GERD.  PAST SURGICAL HISTORY:   Back surgery, knee surgery, tubal ligation, cholecystectomy, tonsillectomy, and breast biopsy.  REVIEW OF SYSTEMS:  Significant for the chest pain and joint pain.  No swelling.  No bleeding problems.  No focal weakness.  All other systems negative.  PHYSICAL EXAMINATION:  VITAL SIGNS:  Blood pressure 127/87 and pulse 76. GENERAL:  She is awake, alert, in no apparent distress. HEAD:  Normocephalic and atraumatic. EYES:  Extraocular movements are intact. NECK:  No JVD. CARDIOVASCULAR:  Regular rate and rhythm.  S1 and S2. LUNGS:  Clear to auscultation bilaterally. ABDOMEN:  Soft, nontender, and obese. EXTREMITIES:  No edema. NEURO:  No focal motor or sensory deficits. SKIN:  No rash. BACK:  No kyphosis.  LABORATORY WORK:  Negative troponin.  ECG shows normal sinus rhythm, no Q-waves, no ST-segment changes, indicative of ischemia.  Chest x-ray shows minimal scarring at the left base.  No active cardiopulmonary disease.  ASSESSMENT/PLAN:  This is a 55 year old with atypical chest pain. 1. Rule out for myocardial infarction with enzymes.  Watch on     telemetry. 2. She needs aggressive risk factor modification.  Continue aggressive  blood pressure control.  She would benefit from weight loss.  She     will need her lipid status determined as well. 3. We will keep n.p.o. past midnight.  This chest pain does not sound     typical for cardiac disease, however, she may need risk     stratification.  Certainly if her enzymes return positive, she may     need angiography.  Further decisions regarding workup will be left     for Dr. Donnie Aho.     Corky Crafts, MD     JSV/MEDQ  D:  08/09/2010  T:  08/10/2010  Job:  [id number]  Electronically Signed by Lance Muss MD on 08/10/2010 10:33:04 AM

## 2010-08-14 LAB — CBC
MCH: 28.2 pg (ref 26.0–34.0)
MCHC: 33.7 g/dL (ref 30.0–36.0)
MCV: 83.7 fL (ref 78.0–100.0)
Platelets: 302 10*3/uL (ref 150–400)

## 2010-08-14 LAB — BASIC METABOLIC PANEL
CO2: 30 mEq/L (ref 19–32)
Calcium: 9 mg/dL (ref 8.4–10.5)
Chloride: 103 mEq/L (ref 96–112)
GFR calc Af Amer: >60 mL/min (ref >60)
Potassium: 3.4 mEq/L — ABNORMAL LOW (ref 3.5–5.1)
Sodium: 138 mEq/L (ref 135–145)

## 2010-08-24 ENCOUNTER — Other Ambulatory Visit: Payer: Self-pay | Admitting: Internal Medicine

## 2010-08-24 MED ORDER — METOCLOPRAMIDE HCL 10 MG PO TABS: 10.0000 mg | ORAL_TABLET | Freq: Every day | ORAL | Status: DC

## 2010-08-24 NOTE — Telephone Encounter (Signed)
Dr Juanda Chance this patient was given reglan tid #90 with 1 refill on 01/23/11 for gastroparesis. However, she has not had it filled since that time so I assume she is not taking as prescribed. Would you like me to go ahead and refill?

## 2010-08-24 NOTE — Telephone Encounter (Signed)
Yes,please, refill but change the instructions to qhs, #30, 3 refills

## 2010-09-10 LAB — COMPREHENSIVE METABOLIC PANEL
ALT: 62 U/L — ABNORMAL HIGH (ref 0–35)
Alkaline Phosphatase: 77 U/L (ref 39–117)
BUN: 10 mg/dL (ref 6–23)
CO2: 25 mEq/L (ref 19–32)
Calcium: 9.9 mg/dL (ref 8.4–10.5)
GFR calc non Af Amer: >60 mL/min (ref >60)
Glucose, Bld: 99 mg/dL (ref 70–99)
Sodium: 139 mEq/L (ref 135–145)

## 2010-09-10 LAB — DIFFERENTIAL
Basophils Relative: 1 % (ref 0–1)
Eosinophils Absolute: 0 10*3/uL (ref 0.0–0.7)
Eosinophils Absolute: 0.1 10*3/uL (ref 0.0–0.7)
Lymphocytes Relative: 15 % (ref 12–46)
Lymphs Abs: 1.2 10*3/uL (ref 0.7–4.0)
Lymphs Abs: 1.8 10*3/uL (ref 0.7–4.0)
Neutrophils Relative %: 61 % (ref 43–77)
Neutrophils Relative %: 76 % (ref 43–77)

## 2010-09-10 LAB — CBC
HCT: 42.6 % (ref 36.0–46.0)
Hemoglobin: 14.6 g/dL (ref 12.0–15.0)
MCHC: 34.2 g/dL (ref 30.0–36.0)
MCV: 87.6 fL (ref 78.0–100.0)
Platelets: 245 10*3/uL (ref 150–400)
RBC: 4.85 MIL/uL (ref 3.87–5.11)
WBC: 8 10*3/uL (ref 4.0–10.5)

## 2010-09-10 LAB — URINALYSIS, ROUTINE W REFLEX MICROSCOPIC
Bilirubin Urine: NEGATIVE
Bilirubin Urine: NEGATIVE
Hgb urine dipstick: NEGATIVE
Ketones, ur: NEGATIVE mg/dL
Nitrite: NEGATIVE
Protein, ur: NEGATIVE mg/dL
Specific Gravity, Urine: 1.013 (ref 1.005–1.030)
Urobilinogen, UA: 0.2 mg/dL (ref 0.0–1.0)
pH: 8 (ref 5.0–8.0)

## 2010-09-10 LAB — CULTURE, BLOOD (ROUTINE X 2)

## 2010-09-10 LAB — URINE CULTURE

## 2010-09-10 LAB — URINE MICROSCOPIC-ADD ON

## 2010-09-10 LAB — PROTIME-INR
INR: 1 (ref 0.00–1.49)
Prothrombin Time: 12.8 seconds (ref 11.6–15.2)

## 2010-09-11 LAB — POCT I-STAT, CHEM 8
BUN: 14 mg/dL (ref 6–23)
Calcium, Ion: 1.16 mmol/L (ref 1.12–1.32)
Chloride: 102 meq/L (ref 96–112)
Creatinine, Ser: 0.7 mg/dL (ref 0.4–1.2)
Glucose, Bld: 94 mg/dL (ref 70–99)
HCT: 44 % (ref 36.0–46.0)
Hemoglobin: 15 g/dL (ref 12.0–15.0)
Potassium: 3.6 mEq/L (ref 3.5–5.1)
Sodium: 134 meq/L — ABNORMAL LOW (ref 135–145)
TCO2: 25 mmol/L (ref 0–100)

## 2010-09-11 LAB — POCT CARDIAC MARKERS
CKMB, poc: <1 ng/mL — ABNORMAL LOW (ref 1.0–8.0)
Myoglobin, poc: 42.7 ng/mL (ref 12–200)
Troponin i, poc: <0.05 ng/mL (ref 0.00–0.09)

## 2010-09-11 NOTE — Discharge Summary (Signed)
Christine Robles, Christine Robles                ACCOUNT NO.:  [id number]  MEDICAL RECORD NO.:  [id number]           PATIENT TYPE:  I  LOCATION:  2021                         FACILITY:  [facility]  PHYSICIAN:  Deirdre Peer. Duru Reiger, M.D. DATE OF BIRTH:  July 23, 1955  DATE OF ADMISSION:  08/09/2010 DATE OF DISCHARGE:  08/14/2010                              DISCHARGE SUMMARY   DISCHARGE DIAGNOSES: 1. Chest pain.  EKG without acute changes, the patient did have an     abnormal Cardiolite which showed an area of mild reversibility     within the anterior wall and anterior septal wall.  Also showed     underlying fixed defect involving the anterior wall.  Normal     motion, EF of 65%.  The patient underwent a heart catheterization,     per verbal report, coronaries clean.  The patient discharged in     stable condition. 2. Left shoulder pain, probable musculoskeletal etiology, x-ray with     no acute bony findings. 3. Depression and anxiety. 4. Hypothyroidism. 5. Insomnia. 6. Sleep apnea. 7. Bipolar 8. Panic disorder. 9. Hypertension. 10.Spinal stenosis. 11.Gastroesophageal reflux disease.  DISCHARGE MEDICATIONS: 1. Ambien 10 mg at bedtime. 2. aspirin 81 mg daily. 3. Cymbalta 30 mg daily. 4. Levothyroxine 100 mcg daily. 5. Multivitamin daily. 6. Prilosec 20 mg. 7. Red yeast rice. 8. Reglan 10 mg p.r.n. 9. Seroquel 50 mg b.i.d. 10.Maxzide 75/50 half a tablet daily. 11.Vitamin D.  DISPOSITION:  The patient discharged home in stable condition, asked to follow up with primary MD in 2 weeks.  CONSULTANT:  [facility].  STUDIES:  The patient underwent nuclear medicine stress test as well as heart catheterization, reports as stated above.  Left shoulder x-ray as stated above.  BMET, low potassium at 3.4.  CBC unremarkable.  Lipid panel, total cholesterol 218, LDL 138, HDL 44.  TSH normal.  HISTORY OF PRESENT ILLNESS:  A 55 year old female presented to the ED with complaint of chest pain.   Because of risk factors, admission was deemed necessary for further evaluation and treatment.  Please see dictated H and P for further details.  Past medical history, medications, social history, past surgical history, allergies, family history per admission H and P.  HOSPITAL COURSE:  The patient was admitted to a telemetry floor bed for evaluation and treatment of chest pain.  She was seen in consultation by Cardiology who recommended a 2-day stress test.  The patient's stress test was moderately abnormal, therefore the patient was scheduled for a heart catheterization.  The patient had her heart catheterization with reported normal coronary arteries.  There has been no recurrence of chest pain during this hospitalization.  Again, her EKG was nonacute. Cardiac enzymes negative for ischemia.  The patient is being discharged to home in stable condition.  The patient has several other medical problems which include bipolar depression, anxiety disorder, spinal stenosis, hypertension, and hypothyroidism.  She will continue her current meds for these problems and will have further outpatient management of these problems.     Deirdre Peer. Caedmon Louque, M.D.     RDP/MEDQ  D:  08/14/2010  T:  08/15/2010  Job:  [id number]  Electronically Signed by Windy Fast Shalini Mair M.D. on 09/11/2010 10:27:04 AM

## 2010-09-22 NOTE — Cardiovascular Report (Signed)
Christine Robles, Christine Robles                ACCOUNT NO.:  [id number]  MEDICAL RECORD NO.:  [id number]           PATIENT TYPE:  I  LOCATION:  2021                         FACILITY:  [facility]  PHYSICIAN:  Lyn Records, M.D.   DATE OF BIRTH:  December 26, 1955  DATE OF PROCEDURE:  08/09/2010 DATE OF DISCHARGE:  08/14/2010                           CARDIAC CATHETERIZATION   INDICATION FOR THE STUDY:  The patient is 28 and has multiple risk factors including obesity and hypertension.  She was admitted to the hospital with chest pain.  A nuclear study suggested subtle anterior wall ischemia.  Because of the abnormal study, heart catheterization was indicated.  PROCEDURES PERFORMED: 1. Left heart cath via left radial. 2. Coronary angiography. 3. Left ventriculography.  DESCRIPTION:  After informed consent and in the postabsorptive state, the patient was brought to the Diagnostic Laboratory at [facility].  Versed and fentanyl were given for conscious sedation.  She received a total of 3 mg of Versed and 100 mcg of fentanyl.  A 1% Xylocaine was used in the left radial area for local anesthesia.  We were able to perform an anterior stick on the left radial artery and placed a 5-French sheath using the modified Seldinger technique.  We then used a 300-cm long tight J exchange wire to advance a 5-French A2 multipurpose catheter to the ascending aorta.  We then used a single catheter to perform left and right coronary angiography and left ventriculography.  The patient had no evidence of coronary disease and normal LV function.  The catheter was removed over a guidewire.  The sheath was removed and a wristband applied.  Good hemostasis was achieved.  The patient received 5000 units of heparin after entering the left radial artery and placement of the sheath.  No complications occurred.  RESULTS: 1. Hemodynamic data:     a.     Aortic pressure 124/72.     b.     Left ventricular pressure  132/70. 2. Left ventriculography:  The left ventricle is faintly opacified.     EF is normal, estimated to be greater than 60%.  No mitral     regurgitation is seen. 3. Coronary angiography.     a.     Left main coronary:  Widely patent.     b.     Left anterior descending coronary:  The left anterior      descending coronary artery is dominant.  It wraps around the left      ventricular apex.  There is no significant plaquing or evidence of      obstructive disease.  Three diagonals arise from the LAD.  LAD is      transapical.     c.     Circumflex artery:  The circumflex coronary artery is large.      There are 3 obtuse marginal branches.  The first obtuse marginal      demonstrates systolic compression in its distal branches.  This is      not seen in all views.     d.     Circumflex artery:  Circumflex  coronary artery has an      anterior to leftward origin from the right sinus of Valsalva.  The      vessel is dominant and normal.  CONCLUSIONS: 1. Normal coronary arteries. 2. Normal left ventricular function. 3. False positive Cardiolite study.  PLAN:  Discharge later today.  No specific cardiac therapy is indicated.     Lyn Records, M.D.     HWS/MEDQ  D:  08/14/2010  T:  08/15/2010  Job:  [id number]  cc:   Georga Hacking, M.D.  Electronically Signed by Verdis Prime M.D. on 09/22/2010 04:52:58 PM

## 2010-10-03 ENCOUNTER — Telehealth: Payer: Self-pay | Admitting: *Deleted

## 2010-10-03 DIAGNOSIS — D1803 Hemangioma of intra-abdominal structures: Secondary | ICD-10-CM

## 2010-10-03 NOTE — Telephone Encounter (Signed)
Message copied by Vernia Buff on Tue Oct 03, 2010  9:24 AM ------      Message from: Vernia Buff      Created: Wed Aug 09, 2010  8:23 AM       See CT from 10/2009. Needs repeat CT scheduled

## 2010-10-03 NOTE — Telephone Encounter (Signed)
Patient is scheduled for follow up CT scan on 10/17/10 (Tuesday) at 10 am, [place] [facility]. I have left a message on cell and home number to call back.

## 2010-10-04 NOTE — Telephone Encounter (Signed)
Spoke with patient and advised her of time and date of follow up CT scan for her hemangioma. She verbalizes of time, date and location of CT. I have also instructed her that she should have nothing to eat or drink 4 hours prior to her test and that she should drink 1 bottle contrast at 8 am and one at 9 am the day of her tests (she will pick up contrast here).

## 2010-10-05 ENCOUNTER — Telehealth: Payer: Self-pay | Admitting: *Deleted

## 2010-10-05 ENCOUNTER — Encounter: Payer: Self-pay | Admitting: Internal Medicine

## 2010-10-05 DIAGNOSIS — D1803 Hemangioma of intra-abdominal structures: Secondary | ICD-10-CM

## 2010-10-05 NOTE — Telephone Encounter (Signed)
Dr Juanda Chance- Please see 10/03/10 phone note. Per 10/13/09 CT abd/pelvis, you wanted patient to have a repeat CT abd/pelvis in 1 year for follow up of liver hemangiomas and questionable adenoma. However, patient's insurance will only cover CT abdomen since there were no abnormalities seen in pelvis on last CT. I have also noticed that in the radiologist impression, it notes that "a contrast MRI would be the study of choice." So, do you indeed want the repeat CT or MRI? If you want CT is abdomen only okay since insurance will not cover the pelvis?

## 2010-10-05 NOTE — Telephone Encounter (Signed)
Please schedule MRI of the abdomen with contrast in place of the CT scan for follow up the liver lesion.

## 2010-10-06 ENCOUNTER — Other Ambulatory Visit (HOSPITAL_COMMUNITY): Payer: Self-pay | Admitting: Cardiology

## 2010-10-06 NOTE — Telephone Encounter (Signed)
I have called and spoken to [facility] [place] [facility] to cancel CT abd/pelvis since insurance will only cover CT abdomen and Dr Juanda Chance now would like MRI. I have spoken to [facility] in [facility] and have instead scheduled patient for MRI abdomen with contrast for Saturday, 10-14-10 @ 9 am. I have advised Victorino Dike that patient does have screws and a rod in her back from a surgery in 2010, but no pacemaker etc. She states that this should not be a problem due to the type of metal it is. I have also called and spoken to the patient to let her know that her insurance would not cover the CT abd and pelvis and that we would like to change her test over to an MRI. I have advised her that she no longer needs to pick up contrast to drink. I have also advised her of time, date and location of MRI. She has been advised to be NPO 4 hours prior to test and to arrive at 8:45 am for registration. Patient verbalizes understanding.

## 2010-10-14 ENCOUNTER — Ambulatory Visit (HOSPITAL_COMMUNITY)
Admission: RE | Admit: 2010-10-14 | Discharge: 2010-10-14 | Disposition: A | Discharge status: Home / Self Care | Payer: BC Managed Care – PPO | Source: Ambulatory Visit | Attending: Internal Medicine | Admitting: Internal Medicine

## 2010-10-14 DIAGNOSIS — K7689 Other specified diseases of liver: Secondary | ICD-10-CM | POA: Insufficient documentation

## 2010-10-14 DIAGNOSIS — D1803 Hemangioma of intra-abdominal structures: Secondary | ICD-10-CM

## 2010-10-14 MED ORDER — GADOBENATE DIMEGLUMINE 529 MG/ML IV SOLN
20.0000 mL | Freq: Once | INTRAVENOUS | Status: AC | PRN
  Administered 2010-10-14: 20 mL via INTRAVENOUS

## 2010-10-17 ENCOUNTER — Inpatient Hospital Stay: Admission: RE | Admit: 2010-10-17 | Payer: BC Managed Care – PPO | Source: Ambulatory Visit

## 2010-10-17 NOTE — Op Note (Signed)
Christine Robles, Christine Robles                ACCOUNT NO.:  [id number]   MEDICAL RECORD NO.:  [id number]          PATIENT TYPE:  INP   LOCATION:  3108                         FACILITY:  [facility]   PHYSICIAN:  Payton Doughty, M.D.      DATE OF BIRTH:  Apr 21, 1956   DATE OF PROCEDURE:  DATE OF DISCHARGE:                               OPERATIVE REPORT   PREOPERATIVE DIAGNOSIS:  L4-5 and L5-S1 spondylosis.   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   PROCEDURES:  L4-5 and L5-S1 right laminectomy, facetectomy, segmental  pedicle screw fixation at L4, L5,  and S1 and posterolateral arthrodesis  at L4, L5, and S1.   DICTATING DOCTOR:  Payton Doughty, MD, Neurosurgery.   ANESTHESIA:  General endotracheal.   PREPARATION:  Prepped and draped with alcohol wipe.   COMPLICATIONS:  None.   NURSE ASSISTANT:  Kathleene Hazel, RN.   DOCTOR ASSISTANT:  Cristi Loron, MD.   BODY OF TEXT:  A 55 year old girl with lumbar spondylosis and had a  prior left L4-5 laminectomy numerous years ago, taken to operative room,  smoothly anesthetized and intubated, placed prone on the operating  table.  Following shave, prep, and drape in the usual sterile fashion,  skin was infiltrated with 1% lidocaine.  The skin was incised from mid  L3 to mid S1.  The lamina and transverse processes of L4, L5, and sacral  ala were exposed bilaterally in the subperiosteal plane.  Intraoperative  x-ray was taken several times to confirm correctness of level.  Having  confirmed correctness of level on the right side, the pars reticularis  and inferior facet of L4 and L5 and the superior facet of L5 and S1 were  removed using the high-speed drill.  This allowed access to the lateral  recess area.  At L4-5, there was a large conjoined nerve root which  completely overlied the disk space and prevented entering the disk  space.  At L5-S1, the sacrum was severely angulated and L5-S1  were  ankylosed posteriorly at the posterior longitudinal ligament.  Because  of interference from the 5, it was not possible to safely enter the disk  space.  Because of the patient's complaints were chiefly those of left  leg pain, it was decided to not enter the right side because that could  cause further destabilization with no benefit, particularly interbody  devices could not be placed over there.  Pedicle screws were then placed  at L4, L5, and S1 bilaterally connected by rod and locking caps applied.  The remaining lamina on the right side was decorticated and packed with  BMP and the patient's own bone.  The transverse process and sacral ala  were decorticated with a high-speed drill and also packed with BMP on  the  extender matrix and the patient's own bone.  Final x-ray showed good  placement of screws and rods.  Successive layers of 0 Vicryl, 2-0  Vicryl, and 3-0 nylon were used to close.  Betadine and Telfa dressing  was applied and  made occlusive with OpSite, and the patient returned to  the recovery room in good condition.     ______________________________  Payton Doughty, M.D.    ______________________________  Payton Doughty, M.D.    MWR/MEDQ  D:  12/24/2008  T:  12/25/2008  Job:  [id number]

## 2010-10-17 NOTE — Assessment & Plan Note (Signed)
[facility]                         GASTROENTEROLOGY OFFICE NOTE   NAME:Benard, AUDINE MANGIONE                       MRN:          [id number]  DATE:08/15/2007                            DOB:          05/22/1956    Ms. Fettes is a 55 year old white female, who we saw in the past for  gastroesophageal reflux disease, as well as for screening colonoscopy.  Since the 1980s, she has had gastroesophageal reflux for which she  initially took H2 receptor antagonists and most recently Prelosec  and  Reglan.  At some point, she discontinued her medications.  We saw her  last time for screening colonoscopy in August 2007 for evaluation  of  Hemoccult-positive stool.  Her father has Crohn's disease, but the  colonoscopic exam was entirely normal and her next recall colonoscopy  was scheduled for ten years from now.  As far as her reflux is  concerned, she is mostly complaining of nausea and being sick  postprandially.  No vomiting, but she wakes up at one or two in the  morning, regurgitating food and liquids.  She already had a  cholecystectomy in 2000.  We even did a gastric emptying scan in 2005,  which was entirely normal.  The last upper endoscopy, in June 2000,  showed normal Z-line and no hiatal hernia.  She has continued to gain  weight despite trying to be on a modified diet.   MEDICATIONS:  1. Triamterene/HCTZ 75/50 a half tablet daily.  2. Synthroid 50 mcg daily.  3. Chlorhexidine oral rinse daily.  4. Fluocinonide 0.05% oral solution b.i.d.  5. She is on hydrocodone and Ambien p.r.n.   PHYSICAL EXAM:  Blood pressure 140/84, pulse 100 and weight 242 pounds,  which represents a weight-gain since last appointment of 12 pounds.  Patient was alert and oriented.  Her voice was hoarse and raspy.  She had no cough.  Sclera was not  icteric.  Oral cavity showed lesions on the upper palate, described by  Dr. Georgia Dom as ulcerative mucositis with moderate epithelial  dysplasia.  Interpretation was that this could represent a lichen planus with  superimposed dysplasia.  LUNGS:  Clear to auscultation.  There were no wheezes or rales.  NECK:  Supple without adenopathy.  COR:  Normal S1, normal S2.  ABDOMEN:  Protuberant with tenderness in left upper quadrant along the  left costal margin and somewhat in epigastrium.  Right upper quadrant  and lower abdomen were unremarkable.   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 Georgia Dom  at [facility].  Patient is naturally concerned about possibility of having  dysplasia in her esophagus.   PLAN:  1. Upper endoscopy.  We need to rule out possibility of candida      esophagitis, because patient is on topical steroids.  2. Start Nexium 40 mg a day for her gastroesophageal reflux disease.  3. Antireflux measures.  4. Consider resuming her  Reglan after her upper endoscopic exam and      appropriate biopsies are done.     Hedwig Morton. Juanda Chance, MD  Electronically Signed    DMB/MedQ  DD: 08/15/2007  DT: 08/15/2007  Job #: [id number]   cc:   Deirdre Peer. Polite, M.D.  [facility] [employer] Dr. Britt Boozer Prof of  Dentistry

## 2010-10-17 NOTE — H&P (Signed)
Christine Robles, BIAS                ACCOUNT NO.:  [id number]   MEDICAL RECORD NO.:  [id number]          PATIENT TYPE:  INP   LOCATION:  3108                         FACILITY:  [facility]   PHYSICIAN:  Payton Doughty, M.D.      DATE OF BIRTH:  20-May-1956   DATE OF ADMISSION:  12/24/2008  DATE OF DISCHARGE:                              HISTORY & PHYSICAL   ADMITTING DIAGNOSIS:  Spondylosis at L4-5 and L5-S1.   BODY OF TEXT:  Very nice 55 year old right-handed white lady who had a  L4-5 decompression done numerous years ago.  I had been seen her since  2007.  She has had pain in her back and down her legs, which has been  getting worse down her left leg.  MR recently obtain, shows significant  slip at L4-5 and extensive spondylosis at L5-S1 and she is now admitted  for fusion at those two levels.   MEDICAL HISTORY:  Remarkable for bipolar disorder, hypertension, and  hyperthyroidism.   MEDICATIONS:  She takes Lexapro, Wellbutrin, Dyazide, Synthroid,  Protonix, lamotrigine, fexofenadine, alprazolam, Ambien, and oxycodone.   SURGICAL HISTORY:  Laminectomy and cholecystectomy.   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   FAMILY HISTORY:  Mom died at 20.  Dad died at 83, history of diabetes  and hypertension.   REVIEW OF SYSTEMS:  Remarkable for night sweats, glasses, loss of  hearing, hypertension, swelling of feet, leg pain when she is walking,  shortness of breath, back pain, arthritis and neck pain, anxiety,  depression, thyroid disease, difficult with memory, and excessive  thirst.   PHYSICAL EXAMINATION:  HEENT:  Within normal limits.  NECK:  She has reasonable range of motion in neck.  CHEST:  Clear.  CARDIAC:  Regular rate and rhythm.  ABDOMEN:  Nontender with no hepatosplenomegaly.  EXTREMITIES:  Without clubbing or cyanosis.  Peripheral pulses are good.  GU:  Deferred.  NEUROLOGIC:  She is awake, alert, and oriented.   Cranial nerves are  intact.  Motor exam shows 5/5 strength throughout the upper and lower  extremities, save for the dorsiflexors on the left side, they are 5-/5.  She has a left L5 sensory deficit.  Straight leg raise is positive on  the left.   MR shows extensive spondylosis of both L4-5 and L5-S1.   CLINICAL IMPRESSION:  Lumbar spondylosis with spondylolisthesis at L4-5.  The plan is for fusion of both at L4-5 and L5-S1.  The risks and  benefits been discussed with her and she wishes to proceed.           ______________________________  Payton Doughty, M.D.     MWR/MEDQ  D:  12/24/2008  T:  12/24/2008  Job:  [id number]

## 2010-10-20 NOTE — Discharge Summary (Signed)
NAMESKYLEEN, BENTLEY                ACCOUNT NO.:  [id number]   MEDICAL RECORD NO.:  [id number]          PATIENT TYPE:  INP   LOCATION:  3011                         FACILITY:  [facility]   PHYSICIAN:  Payton Doughty, M.D.      DATE OF BIRTH:  [birth year]   DATE OF ADMISSION:  12/24/2008  DATE OF DISCHARGE:  12/29/2008                               DISCHARGE SUMMARY   ADMITTING DIAGNOSIS:  Spondylosis L4-L5 and L5-S1.   DISCHARGE DIAGNOSIS:  Spondylosis L4-L5 and L5-S1.   OPERATIVE PROCEDURE:  L4-L5 and L5-S1 laminectomy, diskectomy, posterior  lumbar interbody fusion, pedicle screws and posterolateral arthrodesis.   SURGEON:  Payton Doughty, MD   COMPLICATION:  None.   DISCHARGE STATUS:  Alive and well.   BODY OF TEXT:  A 55 year old girl's history and physical is recounted in  the chart.  She has had pain in her back for numerous years have been  getting worse.  MR shows a slip at L4-L5 and spondylosis at L5-S1.  She  was admitted for fusion.  Medical history is remarkable for bipolar  disorder and hyperthyroidism.  General exam is intact.  Neurologic exam  was intact with significant back and leg pain.  She was admitted after I  ascertained normal laboratory values and underwent a fusion at L4-L5 and  L5-S1.  Postoperatively, she did reasonably well.  She was admitted out  to the floor and underwent physical therapy.  Her incision was dry and  well-healing.  Foley was removed the third postoperative day.  She had  little fever and demonstrated a small urinary tract infection which was  treated with Bactrim to good avail.  By December 27, 2008 she was up eating  and voiding reasonably well but slowly be able to get her brace on.  On  December 29, 2008 she was up eating and voiding normally.  Her incision was  dry and well-healing.  She is discharged home in the care of her family  with Percocet for pain and followup will be in the [place] offices in a  week for sutures.   .     ______________________________  Payton Doughty, M.D.     MWR/MEDQ  D:  01/14/2009  T:  01/15/2009  Job:  [id number]

## 2010-10-20 NOTE — Assessment & Plan Note (Signed)
[facility]                           GASTROENTEROLOGY OFFICE NOTE   NAME:Ordway, DANELIA SNODGRASS                       MRN:          [id number]  DATE:12/28/2005                            DOB:          January 20, 1956    REASON FOR CONSULTATION:  Ms. Vo is a very nice 55 year old black female  who is here today because of  positive Hemoccult cards.  She did the home  test screening which turned out positive.  She herself denies any visible  blood per rectum.  She has been recently on a weight reduction diet and lost  about 24 pounds.  She eats a lot of high fiber foods and her bowel habits  have been regular, having three to four bowel movement daily.  She has a  positive family history of Crohn's disease in her father, but there is no  family history of colon polyps or colon cancer.   We have seen her in the past in 2004, with persistent nausea and she  underwent an upper endoscopy in June 2000, with findings of a normal exam.  Her gallbladder status at that time was abnormal and she underwent a  cholecystectomy with a complete resolution of her symptoms.   MEDICATIONS:  1.  Triamterene.  2.  Hydrochlorothiazide 75/50 mg, one daily.  3.  Synthroid 50 mcg daily.  4.  Super-B complex.  5.  Glucosamine.  6.  Omega-3-6-9.   PAST MEDICAL HISTORY:  1.  High blood pressure.  2.  Thyroid problems.  3.  Arthritic complaints, especially of the lumbosacral spine.  4.  Panic disorder.  5.  Depression.   PAST SURGICAL HISTORY:  1.  Cholecystectomy.  2.  Tubal ligation.  3.  Breast biopsies.   FAMILY HISTORY:  Positive for Crohn's disease in her father.  Diabetes in  her brother and grandfather.  Heart disease in her father, brother and  grandparents.   SOCIAL HISTORY:  She is married with one child.  Some college education.  Works as an Environmental health practitioner.  The patient does not smoke or drink.   REVIEW OF SYSTEMS:  Positive for eyeglasses.   Swelling of her feet.  Arthritic complaints.  Skin rash.  Back pain.  Itching.  Hearing problems.  Shortness of breath.  Muscle pains.   PHYSICAL EXAMINATION:  VITAL SIGNS:  Blood pressure 130/80, pulse 62.  Weight 230 pounds.  GENERAL:  She is mildly overweight, alert and oriented, very cooperative.  LUNGS:  Clear to auscultation.  HEART:  S1, S2 normal.  ABDOMEN:  Soft with minimal tenderness in the epigastrium and also in the  left middle quadrants.  No palpable mass or fullness in the left lower  quadrant.  RECTAL:  Normal rectal tone.  There was no stool in the ampulla.  Mucus was  Hemoccult negative, but actually no specimen was obtained.   IMPRESSION:  A 55 year old black female who tested heme-positive on a home  screening test.  She is otherwise asymptomatic.  I could not reproduce the  test today because she did not have enough stool in the rectum for  me to  check.   RECOMMENDATIONS:  I think she would be a good candidate for screening  colonoscopy because she is 55 years old.  There is a family history of  Crohn's disease but she certainly does not have any symptoms suggestive of  inflammatory bowel disease such as diarrhea, crampy abdominal pain, anemia  or rectal bleeding.   PLAN:  I have discussed the plan for colonoscopy with the patient, who  agrees to go ahead with the examination.  She will use the routine  colonoscopy prep and we will use conscious sedation.  This procedure has  been scheduled for the near future.                                   Hedwig Morton. Juanda Chance, MD   DMB/MedQ  DD:  12/28/2005  DT:  12/28/2005  Job #:  [id number]   cc:   Dr. Riley Nearing

## 2010-10-20 NOTE — Op Note (Signed)
[place]. [facility]  Patient:    Christine Robles, Christine Robles Visit Number: [id number] MRN: [id number]          Service Type: SUR Location: [id number] Attending Physician:  Vikki Ports. Dictated by:   Vikki Ports, M.D. Proc. Date: 05/24/01 Admit Date:  05/24/2001 Discharge Date: 05/25/2001                             Operative Report  PREOPERATIVE DIAGNOSIS:  Acute cholecystitis.  POSTOPERATIVE DIAGNOSIS:  Acute cholecystitis.  PROCEDURE:  Laparoscopic cholecystectomy with intraoperative cholangiogram.  SURGEON:  Vikki Ports, M.D.  ASSISTANT:  Thornton Park. Daphine Deutscher, M.D.  ANESTHESIA:  General.  DESCRIPTION OF PROCEDURE:  The patient was taken to the operating room, placed in the supine position.  After adequate anesthesia was induced using endotracheal tube, the abdomen was prepped and draped in the normal sterile fashion.  Using a transverse infraumbilical incision, I dissected down to the fascia.  Fascia was opened vertically.  An 0 Vicryl pursestring suture was placed around the fascial defect, and the Hasson trocar was placed in the abdomen.  The abdomen was insufflated to 15 mmHg with continuous-flow carbon dioxide.  Under direct visualization a 10 mm port was placed in the subxiphoid region and two 5 mm ports were placed in the right abdomen.  The gallbladder was identified and retracted cephalad.  The gallbladder was quite edematous. The infundibulum was identified.  What appeared to be a very, very short cystic duct was dissected free and clipped proximally at the gallbladder.  A small ductotomy was made, and a 14-gauge Angiocath was placed in the right upper quadrant.  Through this a Reddick catheter was placed and cholangiogram was performed.  There was a questionable right lobe accessory duct, but the common duct both distally and proximally could be visualized.  There were no filling defects.  There was free flow into the  duodenum.  The cholangiocatheter was removed.  The cystic duct was triply clipped and divided.  Cystic artery was identified, triply clipped, and divided. Gallbladder was then taken off the gallbladder bed using Bovie electrocautery. It was completely removed from the liver bed and removed through the umbilical port.  The right upper quadrant was copiously irrigated.  Adequate hemostasis was ensured.  All trocars were removed.  The abdomen was allowed to deflate. The infraumbilical fascial defect was closed with the 0 Vicryl pursestring suture.  Skin incisions were closed with 4-0 Monocryl.  Steri-Strips, sterile dressings were applied.  All incisions were injected using 0.5% Marcaine.  The patient tolerated the procedure well and was taken to PACU in good condition. Dictated by:   Vikki Ports, M.D. Attending Physician:  Vikki Ports DD:  05/25/01 TD:  05/26/01 Job: [id number]

## 2010-10-20 NOTE — Op Note (Signed)
[place]. [facility]  Patient:    Christine Robles, Christine Robles Visit Number: [id number] MRN: [id number]          Service Type: DSU Location: [facility] Attending Physician:  Vikki Ports. Dictated by:   Vikki Ports, M.D. Proc. Date: 12/25/01 Admit Date:  12/25/2001 Discharge Date: 12/25/2001                             Operative Report  PREOPERATIVE DIAGNOSIS:  Lipoma of the right thigh.  POSTOPERATIVE DIAGNOSIS:  Lipoma of the right thigh.  PROCEDURE:  Excision of right thigh lipoma.  ANESTHESIA:  MAC.  SURGEON:  Vikki Ports, M.D.  DESCRIPTION OF PROCEDURE:  The patient was taken to the operating room and placed in the supine position.  After adequate MAC anesthesia was induced, the right thigh was prepped and draped in the usual sterile fashion.  Using 1% lidocaine local anesthesia, the skin and subcutaneous tissue surrounding the mass was anesthetized.  A vertically oriented incision was made and dissected down on to a well-lobulated fatty mass which was well-encapsulated, and easily dissected free of surrounding structures, and sent for pathologic evaluation. Adequate hemostasis was ensured and the skin was closed with subcuticular 4-0 Monocryl.  Steri-Strips and sterile dressings were applied.  The patient tolerated the procedure well and went to PACU in stable condition. Dictated by:   Vikki Ports, M.D. Attending Physician:  Danna Hefty R. DD:  12/25/01 TD:  12/28/01 Job: [id number]

## 2010-10-20 NOTE — Op Note (Signed)
NAMEHINATA, Christine Robles                ACCOUNT NO.:  [id number]   MEDICAL RECORD NO.:  [id number]          PATIENT TYPE:  AMB   LOCATION:  DSC                          FACILITY:  [facility]   PHYSICIAN:  Loreta Ave, M.D. DATE OF BIRTH:  07-Jun-1955   DATE OF PROCEDURE:  DATE OF DISCHARGE:                                 OPERATIVE REPORT   PREOPERATIVE DIAGNOSIS:  Left knee chondromalacia of patella, lateral  tracing and tethering, left knee.   POSTOPERATIVE DIAGNOSES:  1. Left knee chondromalacia of patella, lateral tracing and tethering,      left knee with diffuse grade 3 approach grade 4 changes of      patellofemoral joint.  2. Radial tear of the mid portion of lateral meniscus.  3. Diffuse deep grade 3 changes weightbearing dome medial femoral condyle.  4. Focal grade 4 chondral change, anterior marginal lateral tibial      plateau.   PROCEDURE:  1. Left knee exam under anesthesia arthroscopy, extensive chondroplasty of      the patellofemoral joint, lateral tibial plateau, medial femoral      condyle.  2. Debridement of medial meniscus tear.  3. Lateral retinacular release.   SURGEON:  Loreta Ave, M.D.   ASSISTANT:  Genene Churn. Denton Meek.   ANESTHESIA:  Knee block with sedation.   SPECIMENS:  None.   COUNTS:  None.   COMPLICATIONS:  None.   DRESSING:  Soft compressive with lateral bolster.   PROCEDURE:  The patient brought to the operating room and placed on the  operating room table in supine position.  After adequate anesthesia was  obtained, the knee examined for motion disability.  Patellofemoral crepitus,  lateral tracking, and lateral tethering.  Tourniquet leg holder applied and  the leg prepped and draped in the usual sterile fashion.  Three portals are  created; 1 superolateral, 1 each medial and lateral parapatellar.  In-flow  catheter induced, needed standard arthroscope was introduced and knee  inspected.  Diffuse grade 3 changes more on the  lateral patella and  trochlear but even some extending over to the medial side superiorly.  Chondroplasty was stable service.  Not quite grade 4.  Trochlear did not  look as bad.  Confirmed significant tethering.  Remaining knee examined  after chondroplasty of patella.  Lateral meniscus and radial tear mid  portion __________.  Medial femoral condyle deep grade 3 changes throughout  the weightbearing dome debrided with chondroplasty.  No full thickness loss.  A focal grade 4 area on the front of the lateral tibial plateau debrided.  Not enough involvement it had more microfracture.  Tracking assessed from  all angles.  Left tethering __________ release.  Cautery was then used due  to lateral release from vastus lateralis superiorly down the lateral joint  line inferiorly.  Markedly improved tethering and tracking at completion.  Hemostasis cautery.  Instruments are fully removed.  Knee injected with  Marcaine.  Portals were closed with nylon.  Sterile compression dressing  with lateral bolster applied.  Anesthesia reversed.  Brought to recovery  room.  Tolerated the  surgery well.  No complications.      Loreta Ave, M.D.  Electronically Signed     DFM/MEDQ  D:  01/24/2006  T:  01/24/2006  Job:  [id number]

## 2011-01-27 ENCOUNTER — Emergency Department (HOSPITAL_COMMUNITY): Payer: BC Managed Care – PPO

## 2011-01-27 ENCOUNTER — Emergency Department (HOSPITAL_COMMUNITY)
Admission: EM | Admit: 2011-01-27 | Discharge: 2011-01-28 | Disposition: A | Discharge status: Home / Self Care | Payer: BC Managed Care – PPO | Attending: Emergency Medicine | Admitting: Emergency Medicine

## 2011-01-27 DIAGNOSIS — K439 Ventral hernia without obstruction or gangrene: Secondary | ICD-10-CM | POA: Insufficient documentation

## 2011-01-27 DIAGNOSIS — E039 Hypothyroidism, unspecified: Secondary | ICD-10-CM | POA: Insufficient documentation

## 2011-01-27 DIAGNOSIS — R1033 Periumbilical pain: Secondary | ICD-10-CM | POA: Insufficient documentation

## 2011-01-27 DIAGNOSIS — I10 Essential (primary) hypertension: Secondary | ICD-10-CM | POA: Insufficient documentation

## 2011-01-27 LAB — CBC
HCT: 39.3 % (ref 36.0–46.0)
Hemoglobin: 13.4 g/dL (ref 12.0–15.0)
MCV: 84.3 fL (ref 78.0–100.0)
RBC: 4.66 MIL/uL (ref 3.87–5.11)
WBC: 7.4 10*3/uL (ref 4.0–10.5)

## 2011-01-27 LAB — DIFFERENTIAL
Basophils Absolute: 0.1 10*3/uL (ref 0.0–0.1)
Lymphocytes Relative: 30 % (ref 12–46)
Lymphs Abs: 2.2 10*3/uL (ref 0.7–4.0)
Neutro Abs: 4.5 10*3/uL (ref 1.7–7.7)
Neutrophils Relative %: 61 % (ref 43–77)

## 2011-01-27 LAB — URINALYSIS, ROUTINE W REFLEX MICROSCOPIC
Glucose, UA: NEGATIVE mg/dL
Hgb urine dipstick: NEGATIVE
Specific Gravity, Urine: 1.016 (ref 1.005–1.030)
pH: 6 (ref 5.0–8.0)

## 2011-01-27 LAB — URINE MICROSCOPIC-ADD ON

## 2011-01-27 LAB — COMPREHENSIVE METABOLIC PANEL
Alkaline Phosphatase: 83 U/L (ref 39–117)
BUN: 15 mg/dL (ref 6–23)
CO2: 29 mEq/L (ref 19–32)
Chloride: 101 mEq/L (ref 96–112)
Creatinine, Ser: 0.72 mg/dL (ref 0.50–1.10)
GFR calc non Af Amer: >60 mL/min (ref >60)
Potassium: 3.4 mEq/L — ABNORMAL LOW (ref 3.5–5.1)
Total Bilirubin: 0.2 mg/dL — ABNORMAL LOW (ref 0.3–1.2)

## 2011-01-27 MED ORDER — IOHEXOL 300 MG/ML  SOLN
125.0000 mL | Freq: Once | INTRAMUSCULAR | Status: AC | PRN
  Administered 2011-01-27: 125 mL via INTRAVENOUS

## 2011-01-29 ENCOUNTER — Encounter (INDEPENDENT_AMBULATORY_CARE_PROVIDER_SITE_OTHER): Payer: Self-pay | Admitting: General Surgery

## 2011-01-31 ENCOUNTER — Ambulatory Visit (INDEPENDENT_AMBULATORY_CARE_PROVIDER_SITE_OTHER): Payer: BC Managed Care – PPO | Admitting: General Surgery

## 2011-01-31 ENCOUNTER — Encounter (INDEPENDENT_AMBULATORY_CARE_PROVIDER_SITE_OTHER): Payer: Self-pay | Admitting: General Surgery

## 2011-01-31 VITALS — BP 152/102 | HR 80 | Temp 98.7°F | Ht 67.0 in | Wt 265.4 lb

## 2011-01-31 DIAGNOSIS — K439 Ventral hernia without obstruction or gangrene: Secondary | ICD-10-CM

## 2011-01-31 NOTE — Progress Notes (Signed)
Chief Complaint  Patient presents with  . Other    new pt- eval epigastric hernia    HPI Christine Robles is a 55 y.o. female.  This patient is referred by Dr. Lynelle Doctor at the emergency room for evaluation of ventral hernia. She states that she's noticed a "knot" in her abdomen for approximately one month but has not caused her any discomfort until this last week and she began having pain in the region. About 3-4 days ago she began having increasing discomfort in the region just above her bellybutton and noticed a bulge. The only relief was obtained when she put pressure in the area to support the bulge.  She states that she's noticed some decreased frequency of her bowels as well as some nausea but no vomiting or fevers or chills and no other obstructive symptoms. Chest CT scan in the emergency room which demonstrated a supraumbilical hernia with some fat and no evidence of bowel contents. This did have some inflammatory changes and stranding around the area concerning for possible incarceration. HPI  Past Medical History  Diagnosis Date  . Hypertension   . Anxiety   . Depression   . Chronic back pain   . Arthritis   . GERD (gastroesophageal reflux disease)   . Thyroid disease     hypothyroidism    Past Surgical History  Procedure Date  . Cholecystectomy   . Back surgery   . Knee surgery     left  . Cardiac catheterization 2012    Family History  Problem Relation Age of Onset  . Cancer Mother     leukemia  . Hypertension Father   . Heart disease Father   . Heart disease Brother     pacemaker    Social History History  Substance Use Topics  . Smoking status: Former Games developer  . Smokeless tobacco: Never Used  . Alcohol Use: No    Allergies  Allergen Reactions  . Lexapro   . Buspirone Hcl     Current Outpatient Prescriptions  Medication Sig Dispense Refill  . ALPRAZolam (XANAX) 1 MG tablet Ad lib.      . CYMBALTA 60 MG capsule daily.      Marland Kitchen levothyroxine (SYNTHROID,  LEVOTHROID) 100 MCG tablet daily.      . Multiple Vitamin (MULTIVITAMIN) capsule Take 1 capsule by mouth daily.        Marland Kitchen oxyCODONE-acetaminophen (PERCOCET) 5-325 MG per tablet Take 1 tablet by mouth every 4 (four) hours as needed.        Marland Kitchen QUEtiapine (SEROQUEL) 25 MG tablet BID times 48H.      . triamterene-hydrochlorothiazide (MAXZIDE-25) 37.5-25 MG per tablet daily.      Marland Kitchen zolpidem (AMBIEN) 10 MG tablet daily.        Review of Systems Review of Systems  Constitutional: Negative.   HENT: Negative.   Eyes: Negative.   Respiratory: Negative.   Cardiovascular: Negative.   Gastrointestinal: Negative.   Genitourinary: Negative.   Musculoskeletal: Negative.   Skin: Negative.   Neurological: Negative.   Endo/Heme/Allergies: Negative.   Psychiatric/Behavioral: Negative.     Blood pressure 152/102, pulse 80, temperature 98.7 F (37.1 C), temperature source Temporal, height 5\' 7"  (1.702 m), weight 265 lb 6.4 oz (120.385 kg).  Physical Exam Physical Exam  Constitutional: She is oriented to person, place, and time. She appears well-developed and well-nourished. No distress.  HENT:  Head: Normocephalic and atraumatic.  Eyes: Conjunctivae are normal. Pupils are equal, round, and reactive to light.  Right eye exhibits no discharge. Left eye exhibits no discharge. No scleral icterus.  Neck: Normal range of motion. Neck supple. No tracheal deviation present.  Cardiovascular: Normal rate, regular rhythm and normal heart sounds.   Respiratory: Effort normal and breath sounds normal. No stridor. No respiratory distress. She has no wheezes.  GI: Soft. Bowel sounds are normal. She exhibits no distension. There is no tenderness. There is no rebound and no guarding.       Reducible supraumbilical hernia approximately 3-4cm above umbilicus without any skin changes  Musculoskeletal: Normal range of motion. She exhibits no edema and no tenderness.  Neurological: She is alert and oriented to person,  place, and time.  Skin: Skin is warm and dry. No rash noted. She is not diaphoretic. No erythema. No pallor.  Psychiatric: She has a normal mood and affect. Her behavior is normal. Judgment and thought content normal.     Data Reviewed   Assessment    Supraumbilical ventral hernia without evidence of strangulation or bowel contents. I think that her symptoms could be attributed to this ventral hernia. On CT scan it did appear to have some inflammatory changes concerning for incarceration although on exam today this did not appear to be incarcerated. I discussed with her the options for continued observation versus surgical repair. We discussed both laparoscopic and open ventral hernia repair. We discussed the pros and cons of each option and for her, I do not think there are many advantages to one option over the otherin her except for decreased wound infection with a laparoscopic repair. We discussed the risks of the procedure including infection, bleeding, pain, scarring, recurrence, chronic pain, bowel injury and she states understanding and desires to proceed with ventral hernia repair. We will schedule this at her convenience.    Plan    Plan for open ventral hernia repair with likely mesh.       Lodema Pilot DAVID 01/31/2011, 3:07 PM

## 2011-02-01 ENCOUNTER — Ambulatory Visit (HOSPITAL_COMMUNITY)
Admission: RE | Admit: 2011-02-01 | Discharge: 2011-02-01 | Disposition: A | Discharge status: Home / Self Care | Payer: BC Managed Care – PPO | Source: Ambulatory Visit | Attending: General Surgery | Admitting: General Surgery

## 2011-02-01 DIAGNOSIS — G4733 Obstructive sleep apnea (adult) (pediatric): Secondary | ICD-10-CM | POA: Insufficient documentation

## 2011-02-01 DIAGNOSIS — K439 Ventral hernia without obstruction or gangrene: Secondary | ICD-10-CM

## 2011-02-01 DIAGNOSIS — E669 Obesity, unspecified: Secondary | ICD-10-CM | POA: Insufficient documentation

## 2011-02-01 DIAGNOSIS — K219 Gastro-esophageal reflux disease without esophagitis: Secondary | ICD-10-CM | POA: Insufficient documentation

## 2011-02-01 DIAGNOSIS — I10 Essential (primary) hypertension: Secondary | ICD-10-CM | POA: Insufficient documentation

## 2011-02-01 HISTORY — PX: ABDOMINAL HERNIA REPAIR: SHX539

## 2011-02-06 ENCOUNTER — Encounter (INDEPENDENT_AMBULATORY_CARE_PROVIDER_SITE_OTHER): Payer: Self-pay

## 2011-02-13 NOTE — Op Note (Signed)
NAMEZAYLEIGH, Christine Robles                ACCOUNT NO.:  [id number]  MEDICAL RECORD NO.:  [id number]  LOCATION:  SDSC                         FACILITY:  [facility]  PHYSICIAN:  Lodema Pilot, MD       DATE OF BIRTH:  05/08/1956  DATE OF PROCEDURE:  02/01/2011 DATE OF DISCHARGE:                              OPERATIVE REPORT   PROCEDURE:  Open ventral hernia repair with mesh.  PREOPERATIVE DIAGNOSIS:  Ventral hernia.  POSTOPERATIVE DIAGNOSIS:  Ventral hernia.  SURGEON:  Lodema Pilot, MD  ASSISTANT:  None.  ANESTHESIA:  General endotracheal anesthesia with 40 mL of 1% lidocaine with epinephrine and 0.25% Marcaine injected in a 50/50 mixture.  FLUIDS:  900 mL of crystalloid.  ESTIMATED BLOOD LOSS:  Minimal.  DRAINS:  None.  SPECIMENS:  None.  COMPLICATIONS:  None apparent.  FINDINGS:  A 2.5-cm fascial defect in the midline approximately 4 cm cephalad to the umbilicus with placement of 4.3 cm x 4.3 cm PVP patch in underlay fashion with primary closure of the defect over the mesh.  INDICATIONS FOR PROCEDURE:  Ms. Depasquale is a 55 year old female with symptomatic ventral hernia documented by physical exam and CT skin. This is causing symptoms for her and she desires operative repair.  OPERATIVE DETAILS:  Ms. Sanderlin was seen and evaluated in the preoperative area and the surgical site was marked.  Prior to anesthetic administration while she was in a standing position, this was only really palpable while standing.  Then, she was taken to the operating room and placed on table in supine position and general endotracheal anesthesia was obtained.  Prophylactic antibiotics were given and her abdomen was prepped and draped in a standard surgical fashion.  A midline incision was made in the skin over the defect and dissection was carried down through the subcutaneous tissue using Bovie electrocautery. The hernia sac was identified and this was dissected circumferentially along the fascial  edge.  The hernia sac was freed up from the fascial edge and this was only containing fat which was able to be reduced into the abdomen without difficulty and this was really only preperitoneal fat that was herniated.  I did not see any bowel during the dissection. The fascial defect was approximately to 2.5 cm x 2.5 cm and she appeared to have healthy-appearing fascia surrounding. Given obesity and the size of defect, we decided to place Prolene ventral patch in the preperitoneal space in an underlay fashion.  The undersurface of the fascia was cleared in the order to accommodate placement of the patch and four 2-0 Prolene sutures were placed at the 12 o'clock, 3 o'clock, 6 o'clock, and 12 o'clock position on the edge of the mesh and the sutures were parachuted from internal to external fashion at the same positions and the mesh was placed in the preperitoneal space and the sutures were placed under direct visualization to ensure that there was no bowel injury during the dissection.  The sutures were secured and then using interrupted 0 Ethibond sutures the fascial defect was approximated over the mesh and incorporating a small portion of the tails of the mesh and the closure of the fascia was approximated over the  mesh in case of a postoperative wound infection to minimize chance for contamination of the mesh.  The wound was irrigated and inspected for hemostasis which was obtained with Bovie electrocautery and the fascia was injected with 1% lidocaine with epinephrine and 0.25% Marcaine in a 50/50 mixture and the wound was irrigated with sterile saline solution and again hemostasis was obtained with Bovie electrocautery.  Then, the dermis was approximated with interrupted 3-0 Vicryl sutures and the skin edges were approximated with 4-0 Monocryl subcuticular suture.  Skin was washed and dried and Dermabond was applied.  All sponge, needle, and instrument counts were correct at the end of  the case and the patient tolerated the procedure well.  There were no apparent complications.  She was stable and ready for transfer to the recovery room in stable condition.          ______________________________ Lodema Pilot, MD     BL/MEDQ  D:  02/01/2011  T:  02/01/2011  Job:  [id number]  Electronically Signed by Lodema Pilot DO on 02/13/2011 04:00:59 PM

## 2011-02-15 ENCOUNTER — Telehealth (INDEPENDENT_AMBULATORY_CARE_PROVIDER_SITE_OTHER): Payer: Self-pay | Admitting: General Surgery

## 2011-02-16 ENCOUNTER — Telehealth (INDEPENDENT_AMBULATORY_CARE_PROVIDER_SITE_OTHER): Payer: Self-pay | Admitting: General Surgery

## 2011-02-22 ENCOUNTER — Encounter (INDEPENDENT_AMBULATORY_CARE_PROVIDER_SITE_OTHER): Payer: Self-pay | Admitting: General Surgery

## 2011-02-22 ENCOUNTER — Ambulatory Visit (INDEPENDENT_AMBULATORY_CARE_PROVIDER_SITE_OTHER): Payer: BC Managed Care – PPO | Admitting: General Surgery

## 2011-02-22 VITALS — BP 142/98 | HR 76 | Temp 98.6°F | Resp 16 | Ht 67.0 in | Wt 260.8 lb

## 2011-02-22 DIAGNOSIS — Z5189 Encounter for other specified aftercare: Secondary | ICD-10-CM

## 2011-02-22 DIAGNOSIS — Z4889 Encounter for other specified surgical aftercare: Secondary | ICD-10-CM

## 2011-02-22 NOTE — Progress Notes (Signed)
Subjective:     Patient ID: Christine Robles, female   DOB: 1955/11/25, 55 y.o.   MRN: [id number]  HPI She follows up today 3 weeks status post open ventral hernia repair with mesh. She is doing well without any complaints. She is not taking any pain medication and is tolerating regular diet. Her bowels are functioning. No fevers or chills.  Review of Systems     Objective:   Physical Exam No acute distress and nontoxic-appearing  Her abdomen is soft and nontender on exam her incision is healing well. She does have some slight erythema around the wound which she states has improved over the last few weeks. There is no drainage or other sign of infection. She does have some firm tissue underlying the incision which I think is appropriate for this stage postop. I do not appreciate any sign of recurrence with Valsalva.    Assessment:     Status post open ventral hernia repair with mesh doing well. She has some slight erythema around the wound but I do not think that this is infection. Clinically, she is doing very well and is nontender and there is no drainage or fevers.  She is also interested in weight loss surgery and she has a BMI of 40 and she should qualify. She is going to look into this further and she will start along the path towards surgery when it's convenient for her work schedule.    Plan:     Followup p.r.n. for her hernia. If the firm tissue underlying her incision is not soften completely in 2 months then I recommended that she followup for repeat evaluation. She will followup when she is ready to proceed with bariatric surgery.

## 2011-02-26 ENCOUNTER — Ambulatory Visit: Payer: BC Managed Care – PPO | Admitting: Internal Medicine

## 2011-03-27 ENCOUNTER — Other Ambulatory Visit: Payer: Self-pay | Admitting: Internal Medicine

## 2011-03-27 DIAGNOSIS — Z1231 Encounter for screening mammogram for malignant neoplasm of breast: Secondary | ICD-10-CM

## 2011-04-09 ENCOUNTER — Ambulatory Visit
Admission: RE | Admit: 2011-04-09 | Discharge: 2011-04-09 | Disposition: A | Discharge status: Home / Self Care | Payer: BC Managed Care – PPO | Source: Ambulatory Visit | Attending: Internal Medicine | Admitting: Internal Medicine

## 2011-04-09 DIAGNOSIS — Z1231 Encounter for screening mammogram for malignant neoplasm of breast: Secondary | ICD-10-CM

## 2011-04-13 ENCOUNTER — Other Ambulatory Visit: Payer: Self-pay | Admitting: Internal Medicine

## 2011-04-13 ENCOUNTER — Other Ambulatory Visit (HOSPITAL_COMMUNITY)
Admission: RE | Admit: 2011-04-13 | Discharge: 2011-04-13 | Disposition: A | Discharge status: Home / Self Care | Payer: BC Managed Care – PPO | Source: Ambulatory Visit | Attending: Internal Medicine | Admitting: Internal Medicine

## 2011-04-13 DIAGNOSIS — Z01419 Encounter for gynecological examination (general) (routine) without abnormal findings: Secondary | ICD-10-CM | POA: Insufficient documentation

## 2011-05-27 ENCOUNTER — Ambulatory Visit (INDEPENDENT_AMBULATORY_CARE_PROVIDER_SITE_OTHER): Payer: BC Managed Care – PPO

## 2011-05-27 DIAGNOSIS — L600 Ingrowing nail: Secondary | ICD-10-CM

## 2011-05-27 DIAGNOSIS — M79609 Pain in unspecified limb: Secondary | ICD-10-CM

## 2012-04-15 ENCOUNTER — Other Ambulatory Visit (HOSPITAL_COMMUNITY)
Admission: RE | Admit: 2012-04-15 | Discharge: 2012-04-15 | Disposition: A | Discharge status: Home / Self Care | Payer: No Typology Code available for payment source | Source: Ambulatory Visit | Attending: Internal Medicine | Admitting: Internal Medicine

## 2012-04-15 ENCOUNTER — Other Ambulatory Visit: Payer: Self-pay | Admitting: Internal Medicine

## 2012-04-15 DIAGNOSIS — Z01419 Encounter for gynecological examination (general) (routine) without abnormal findings: Secondary | ICD-10-CM | POA: Insufficient documentation

## 2012-04-15 DIAGNOSIS — R87810 Cervical high risk human papillomavirus (HPV) DNA test positive: Secondary | ICD-10-CM | POA: Insufficient documentation

## 2012-04-15 DIAGNOSIS — Z1151 Encounter for screening for human papillomavirus (HPV): Secondary | ICD-10-CM | POA: Insufficient documentation

## 2012-04-17 ENCOUNTER — Other Ambulatory Visit: Payer: Self-pay | Admitting: Internal Medicine

## 2012-04-17 DIAGNOSIS — Z1231 Encounter for screening mammogram for malignant neoplasm of breast: Secondary | ICD-10-CM

## 2012-04-24 ENCOUNTER — Ambulatory Visit
Admission: RE | Admit: 2012-04-24 | Discharge: 2012-04-24 | Disposition: A | Discharge status: Home / Self Care | Payer: No Typology Code available for payment source | Source: Ambulatory Visit | Attending: Internal Medicine | Admitting: Internal Medicine

## 2012-04-24 DIAGNOSIS — Z1231 Encounter for screening mammogram for malignant neoplasm of breast: Secondary | ICD-10-CM

## 2012-05-02 ENCOUNTER — Other Ambulatory Visit: Payer: Self-pay | Admitting: Internal Medicine

## 2012-05-02 DIAGNOSIS — N63 Unspecified lump in unspecified breast: Secondary | ICD-10-CM

## 2012-05-15 ENCOUNTER — Other Ambulatory Visit: Payer: Self-pay | Admitting: Internal Medicine

## 2012-05-15 ENCOUNTER — Ambulatory Visit
Admission: RE | Admit: 2012-05-15 | Discharge: 2012-05-15 | Disposition: A | Discharge status: Home / Self Care | Payer: No Typology Code available for payment source | Source: Ambulatory Visit | Attending: Internal Medicine | Admitting: Internal Medicine

## 2012-05-15 DIAGNOSIS — Z0000 Encounter for general adult medical examination without abnormal findings: Secondary | ICD-10-CM

## 2012-05-15 DIAGNOSIS — N63 Unspecified lump in unspecified breast: Secondary | ICD-10-CM

## 2012-07-03 ENCOUNTER — Other Ambulatory Visit: Payer: Self-pay

## 2012-07-03 ENCOUNTER — Emergency Department (HOSPITAL_COMMUNITY)
Admission: EM | Admit: 2012-07-03 | Discharge: 2012-07-03 | Disposition: A | Discharge status: Home / Self Care | Payer: No Typology Code available for payment source | Attending: Emergency Medicine | Admitting: Emergency Medicine

## 2012-07-03 ENCOUNTER — Emergency Department (HOSPITAL_COMMUNITY): Payer: No Typology Code available for payment source

## 2012-07-03 DIAGNOSIS — R110 Nausea: Secondary | ICD-10-CM | POA: Insufficient documentation

## 2012-07-03 DIAGNOSIS — R079 Chest pain, unspecified: Secondary | ICD-10-CM | POA: Insufficient documentation

## 2012-07-03 DIAGNOSIS — I10 Essential (primary) hypertension: Secondary | ICD-10-CM | POA: Insufficient documentation

## 2012-07-03 DIAGNOSIS — Z8701 Personal history of pneumonia (recurrent): Secondary | ICD-10-CM | POA: Insufficient documentation

## 2012-07-03 DIAGNOSIS — F3289 Other specified depressive episodes: Secondary | ICD-10-CM | POA: Insufficient documentation

## 2012-07-03 DIAGNOSIS — M549 Dorsalgia, unspecified: Secondary | ICD-10-CM | POA: Insufficient documentation

## 2012-07-03 DIAGNOSIS — F411 Generalized anxiety disorder: Secondary | ICD-10-CM | POA: Insufficient documentation

## 2012-07-03 DIAGNOSIS — E876 Hypokalemia: Secondary | ICD-10-CM | POA: Insufficient documentation

## 2012-07-03 DIAGNOSIS — M129 Arthropathy, unspecified: Secondary | ICD-10-CM | POA: Insufficient documentation

## 2012-07-03 DIAGNOSIS — Z87891 Personal history of nicotine dependence: Secondary | ICD-10-CM | POA: Insufficient documentation

## 2012-07-03 DIAGNOSIS — Z8719 Personal history of other diseases of the digestive system: Secondary | ICD-10-CM | POA: Insufficient documentation

## 2012-07-03 DIAGNOSIS — F329 Major depressive disorder, single episode, unspecified: Secondary | ICD-10-CM | POA: Insufficient documentation

## 2012-07-03 DIAGNOSIS — G8929 Other chronic pain: Secondary | ICD-10-CM | POA: Insufficient documentation

## 2012-07-03 DIAGNOSIS — K219 Gastro-esophageal reflux disease without esophagitis: Secondary | ICD-10-CM | POA: Insufficient documentation

## 2012-07-03 DIAGNOSIS — Z79899 Other long term (current) drug therapy: Secondary | ICD-10-CM | POA: Insufficient documentation

## 2012-07-03 LAB — URINALYSIS, ROUTINE W REFLEX MICROSCOPIC
Bilirubin Urine: NEGATIVE
Hgb urine dipstick: NEGATIVE
Protein, ur: NEGATIVE mg/dL
Urobilinogen, UA: 0.2 mg/dL (ref 0.0–1.0)

## 2012-07-03 LAB — CBC
HCT: 36.8 % (ref 36.0–46.0)
Hemoglobin: 13.2 g/dL (ref 12.0–15.0)
MCH: 29.1 pg (ref 26.0–34.0)
MCHC: 35.9 g/dL (ref 30.0–36.0)
MCV: 81.1 fL (ref 78.0–100.0)
Platelets: 287 K/uL (ref 150–400)
RBC: 4.54 MIL/uL (ref 3.87–5.11)
RDW: 13.9 % (ref 11.5–15.5)
WBC: 5.5 K/uL (ref 4.0–10.5)

## 2012-07-03 LAB — COMPREHENSIVE METABOLIC PANEL
ALT: 21 U/L (ref 0–35)
AST: 20 U/L (ref 0–37)
CO2: 25 mEq/L (ref 19–32)
Chloride: 98 mEq/L (ref 96–112)
Creatinine, Ser: 0.61 mg/dL (ref 0.50–1.10)
GFR calc non Af Amer: >90 mL/min (ref >90)
Sodium: 132 mEq/L — ABNORMAL LOW (ref 135–145)
Total Bilirubin: 0.3 mg/dL (ref 0.3–1.2)

## 2012-07-03 LAB — POCT I-STAT TROPONIN I
Troponin i, poc: 0 ng/mL (ref 0.00–0.08)
Troponin i, poc: 0 ng/mL (ref 0.00–0.08)
Troponin i, poc: 0 ng/mL (ref 0.00–0.08)

## 2012-07-03 MED ORDER — MORPHINE SULFATE 4 MG/ML IJ SOLN
2.0000 mg | Freq: Once | INTRAMUSCULAR | Status: AC
  Administered 2012-07-03: 2 mg via INTRAVENOUS
  Filled 2012-07-03 (×2): qty 1

## 2012-07-03 MED ORDER — GI COCKTAIL ~~LOC~~
30.0000 mL | Freq: Once | ORAL | Status: AC
  Administered 2012-07-03: 30 mL via ORAL
  Filled 2012-07-03: qty 30

## 2012-07-03 MED ORDER — SODIUM CHLORIDE 0.9 % IV SOLN
1000.0000 mL | INTRAVENOUS | Status: DC
  Administered 2012-07-03: 1000 mL via INTRAVENOUS

## 2012-07-03 MED ORDER — POTASSIUM CHLORIDE CRYS ER 20 MEQ PO TBCR
40.0000 meq | EXTENDED_RELEASE_TABLET | Freq: Once | ORAL | Status: AC
  Administered 2012-07-03: 40 meq via ORAL
  Filled 2012-07-03: qty 2

## 2012-07-03 MED ORDER — OMEPRAZOLE 20 MG PO CPDR: 40.0000 mg | DELAYED_RELEASE_CAPSULE | Freq: Every day | ORAL | Status: DC

## 2012-07-03 NOTE — ED Provider Notes (Signed)
History     CSN: [id number]  Arrival date & time 07/03/12  1429   First MD Initiated Contact with Patient 07/03/12 1517      Chief Complaint  Patient presents with  . Chest Pain    (Consider location/radiation/quality/duration/timing/severity/associated sxs/prior treatment) The history is provided by the patient and medical records.    Christine Robles is a 57 y.o. female  with a hx of HTN, anxiety, chronic back pain, GERD presents to the Emergency Department complaining of gradual, persistent, progressively worsening chest pain onset 2pm.  Pain has decreased.  Pain was located in the anterior chest, radiated to the jaw, rated at a 3/10 and described as pressure.  Pt took ASA 324mg .  No nitro was given.  Pt has a similar episode in 2012 with cardiac cath.  Associated symptoms include chest pain, jaw pain, nausea.  Nothing makes it better and nothing makes it worse.  Pt denies fever, chills, headache, abdominal pain, vomiting, diarrhea, weakness, dizziness, syncope, dysuria. Pt had pneumonia and finished her abx yesterday.    Cardiologist: Christine Robles (cath)  08/2010 Cath report: CONCLUSIONS:  1. Normal coronary arteries.  2. Normal left ventricular function.  3. False positive Cardiolite study.    Past Medical History  Diagnosis Date  . Hypertension   . Anxiety   . Depression   . Chronic back pain   . Arthritis   . GERD (gastroesophageal reflux disease)   . Thyroid disease     hypothyroidism  . Ventral hernia     Past Surgical History  Procedure Date  . Cholecystectomy   . Back surgery   . Knee surgery     left  . Cardiac catheterization 2012  . Hernia repair 02/01/11    ventral hernia    Family History  Problem Relation Age of Onset  . Cancer Mother     leukemia  . Hypertension Father   . Heart disease Father   . Heart disease Brother     pacemaker    History  Substance Use Topics  . Smoking status: Former Games developer  . Smokeless tobacco: Never Used  .  Alcohol Use: No    OB History    Grav Para Term Preterm Abortions TAB SAB Ect Mult Living                  Review of Systems  Constitutional: Negative for fever, diaphoresis, appetite change, fatigue and unexpected weight change.  HENT: Negative for mouth sores and neck stiffness.   Eyes: Negative for visual disturbance.  Respiratory: Negative for cough, chest tightness, shortness of breath and wheezing.   Cardiovascular: Positive for chest pain.  Gastrointestinal: Negative for nausea, vomiting, abdominal pain, diarrhea and constipation.  Genitourinary: Negative for dysuria, urgency, frequency and hematuria.  Musculoskeletal: Negative for back pain.  Skin: Negative for rash.  Neurological: Negative for syncope, light-headedness and headaches.  Hematological: Does not bruise/bleed easily.  Psychiatric/Behavioral: Negative for sleep disturbance. The patient is not nervous/anxious.   All other systems reviewed and are negative.    Allergies  Escitalopram oxalate and Buspirone hcl  Home Medications   Current Outpatient Rx  Name  Route  Sig  Dispense  Refill  . ALPRAZOLAM 1 MG PO TABS   Oral   Take 1 mg by mouth 3 (three) times daily.          . CYMBALTA 60 MG PO CPEP   Oral   Take 60 mg by mouth 2 (two) times daily.          Marland Kitchen  LEVOTHYROXINE SODIUM 100 MCG PO TABS   Oral   Take 100 mcg by mouth daily.          . MULTIVITAMINS PO CAPS   Oral   Take 1 capsule by mouth daily.          Marland Kitchen ZINC PO   Oral   Take 1 tablet by mouth daily.         Marland Kitchen OVER THE COUNTER MEDICATION   Oral   Take 1 capsule by mouth 2 (two) times daily. Natural Accelerator for weight loss from [employer]         . OVER THE COUNTER MEDICATION   Oral   Take 1 tablet by mouth 3 (three) times daily. Pain reliever from health food store         . QUETIAPINE FUMARATE 25 MG PO TABS   Oral   Take 50 mg by mouth 2 (two) times daily.          . TRIAMTERENE-HCTZ 37.5-25 MG PO TABS    Oral   Take 1 tablet by mouth daily.          Marland Kitchen ZOLPIDEM TARTRATE 10 MG PO TABS   Oral   Take 10 mg by mouth at bedtime.          . OMEPRAZOLE 20 MG PO CPDR   Oral   Take 2 capsules (40 mg total) by mouth daily.   60 capsule   0     BP 144/71  Pulse 63  Temp 98.4 F (36.9 C) (Oral)  Resp 18  SpO2 100%  Physical Exam  Nursing note and vitals reviewed. Constitutional: She is oriented to person, place, and time. She appears well-developed and well-nourished. No distress.  HENT:  Head: Normocephalic and atraumatic.  Nose: Nose normal.  Mouth/Throat: Uvula is midline, oropharynx is clear and moist and mucous membranes are normal. No oropharyngeal exudate.  Eyes: Conjunctivae normal and EOM are normal. Pupils are equal, round, and reactive to light. No scleral icterus.  Neck: Normal range of motion. Neck supple.  Cardiovascular: Normal rate, regular rhythm, normal heart sounds and intact distal pulses.  Exam reveals no gallop and no friction rub.   No murmur heard. Pulmonary/Chest: Effort normal and breath sounds normal. No respiratory distress. She has no wheezes. She has no rales. She exhibits no tenderness.  Abdominal: Soft. Bowel sounds are normal. She exhibits no distension and no mass. There is no tenderness. There is no rebound and no guarding.  Musculoskeletal: Normal range of motion. She exhibits no edema and no tenderness.  Lymphadenopathy:    She has no cervical adenopathy.  Neurological: She is alert and oriented to person, place, and time. She exhibits normal muscle tone. Coordination normal.       Speech is clear and goal oriented Moves extremities without ataxia  Skin: Skin is warm and dry. No rash noted. She is not diaphoretic. No erythema.  Psychiatric: She has a normal mood and affect.    ED Course  Procedures (including critical care time)  Labs Reviewed  COMPREHENSIVE METABOLIC PANEL - Abnormal; Notable for the following:    Sodium 132 (*)      Potassium 3.3 (*)     All other components within normal limits  CBC  URINALYSIS, ROUTINE W REFLEX MICROSCOPIC  POCT I-STAT TROPONIN I  POCT I-STAT TROPONIN I  POCT I-STAT TROPONIN I   Dg Chest 2 View  07/03/2012  *RADIOLOGY REPORT*  Clinical Data: Chest pain  CHEST -  2 VIEW  Comparison: Prior chest x-ray 08/09/2010  Findings: Low inspiratory volumes with linear opacities in the bilateral bases favored to reflect subsegmental atelectasis. Cardiac and mediastinal contours remain within normal limits. Negative for edema, focal airspace consolidation, pleural effusion or pneumothorax.  No acute osseous abnormality.  IMPRESSION:  1.  Slightly low inspiratory volumes with bibasilar subsegmental atelectasis. 2.  Otherwise, no acute cardiopulmonary disease.   Original Report Authenticated By: Malachy Moan, M.D.     ECG:  Date: 07/03/2012  Rate: 69  Rhythm: normal sinus rhythm  QRS Axis: normal  Intervals: normal  ST/T Wave abnormalities: normal  Conduction Disutrbances:none  Narrative Interpretation: non-ischemic ECG  Old EKG Reviewed: none available     1. Chest pain       MDM  LETOYA STALLONE presents with chest pain.  Concern for ACS.  Will proceed with cardiac work-up. Mild hypokalemia replaced in the department.  CBC unremarkable, urinalysis without evidence of urinary tract infection, CMP with mild hypokalemia, troponin negative. Chest pain is not likely of cardiac or pulmonary etiology d/t presentation, low PE risk via wells criteria and unlikely diagnosis, VSS, no tracheal deviation, no JVD or new murmur, RRR, breath sounds equal bilaterally, EKG without acute abnormalities, negative troponin x2, and negative CXR. Patient with negative cardiac cath in 2012. Pt pain free at this time.  Pt has been advised resume her PPI and return to the ED if CP becomes exertional, associated with diaphoresis or nausea, radiates to left jaw/arm, worsens or becomes concerning in any way. Pt appears  reliable for follow up and is agreeable to discharge. Patient is to followup with Dr. Katrinka Blazing at [facility] cardiology.  Case has been discussed with Dr. Oletta Lamas who agrees with the above plan to discharge.         Dierdre Forth, PA-C 07/03/12 1955  Christine Ems, PA-C 07/04/12 [phone]

## 2012-07-03 NOTE — ED Notes (Signed)
Urine specimen collected and at bedside. 

## 2012-07-03 NOTE — ED Notes (Signed)
Pt self ambulated to restroom with standby assistance.

## 2012-07-03 NOTE — ED Notes (Signed)
To ED from work via EMS for CP onset 1400, EMS gave 324 ASA, pain was 10/10 with radiation to jaw, pain down to 2/10 on arrival, no SOB or other complaints

## 2012-07-03 NOTE — ED Notes (Signed)
Patient transported to X-ray 

## 2012-07-09 NOTE — ED Provider Notes (Signed)
Medical screening examination/treatment/procedure(s) were performed by non-physician practitioner and as supervising physician I was immediately available for consultation/collaboration.   Gavin Pound. Alandria Butkiewicz, MD 07/09/12 1354

## 2012-07-19 ENCOUNTER — Other Ambulatory Visit: Payer: Self-pay

## 2012-08-07 ENCOUNTER — Encounter (HOSPITAL_COMMUNITY): Payer: Self-pay | Admitting: *Deleted

## 2012-08-07 ENCOUNTER — Inpatient Hospital Stay (HOSPITAL_COMMUNITY)
Admission: RE | Admit: 2012-08-07 | Discharge: 2012-08-14 | DRG: 885 | Disposition: A | Discharge status: Home / Self Care | Payer: No Typology Code available for payment source | Attending: Psychiatry | Admitting: Psychiatry

## 2012-08-07 DIAGNOSIS — F410 Panic disorder [episodic paroxysmal anxiety] without agoraphobia: Secondary | ICD-10-CM

## 2012-08-07 DIAGNOSIS — F333 Major depressive disorder, recurrent, severe with psychotic symptoms: Secondary | ICD-10-CM

## 2012-08-07 DIAGNOSIS — Z8719 Personal history of other diseases of the digestive system: Secondary | ICD-10-CM

## 2012-08-07 DIAGNOSIS — I10 Essential (primary) hypertension: Secondary | ICD-10-CM | POA: Diagnosis present

## 2012-08-07 DIAGNOSIS — F329 Major depressive disorder, single episode, unspecified: Secondary | ICD-10-CM | POA: Diagnosis present

## 2012-08-07 DIAGNOSIS — J020 Streptococcal pharyngitis: Secondary | ICD-10-CM | POA: Diagnosis present

## 2012-08-07 DIAGNOSIS — K219 Gastro-esophageal reflux disease without esophagitis: Secondary | ICD-10-CM | POA: Diagnosis present

## 2012-08-07 DIAGNOSIS — E039 Hypothyroidism, unspecified: Secondary | ICD-10-CM | POA: Diagnosis present

## 2012-08-07 DIAGNOSIS — Z79899 Other long term (current) drug therapy: Secondary | ICD-10-CM

## 2012-08-07 DIAGNOSIS — F4312 Post-traumatic stress disorder, chronic: Secondary | ICD-10-CM

## 2012-08-07 DIAGNOSIS — R45851 Suicidal ideations: Secondary | ICD-10-CM

## 2012-08-07 DIAGNOSIS — F332 Major depressive disorder, recurrent severe without psychotic features: Principal | ICD-10-CM | POA: Diagnosis present

## 2012-08-07 DIAGNOSIS — F411 Generalized anxiety disorder: Secondary | ICD-10-CM | POA: Diagnosis present

## 2012-08-07 DIAGNOSIS — M4800 Spinal stenosis, site unspecified: Secondary | ICD-10-CM

## 2012-08-07 HISTORY — DX: Hypothyroidism, unspecified: E03.9

## 2012-08-07 LAB — COMPREHENSIVE METABOLIC PANEL
ALT: 40 U/L — ABNORMAL HIGH (ref 0–35)
AST: 22 U/L (ref 0–37)
CO2: 26 mEq/L (ref 19–32)
Calcium: 9.1 mg/dL (ref 8.4–10.5)
Creatinine, Ser: 0.79 mg/dL (ref 0.50–1.10)
GFR calc Af Amer: >90 mL/min (ref >90)
GFR calc non Af Amer: >90 mL/min (ref >90)
Sodium: 137 mEq/L (ref 135–145)
Total Protein: 6.9 g/dL (ref 6.0–8.3)

## 2012-08-07 LAB — CBC
MCH: 27.8 pg (ref 26.0–34.0)
MCHC: 33.9 g/dL (ref 30.0–36.0)
MCV: 82 fL (ref 78.0–100.0)
Platelets: 339 10*3/uL (ref 150–400)
RBC: 4.78 MIL/uL (ref 3.87–5.11)

## 2012-08-07 MED ORDER — HYDROCODONE-ACETAMINOPHEN 5-325 MG PO TABS
1.0000 | ORAL_TABLET | Freq: Four times a day (QID) | ORAL | Status: DC | PRN
  Administered 2012-08-07 – 2012-08-11 (×12): 1 via ORAL
  Filled 2012-08-07 (×12): qty 1

## 2012-08-07 MED ORDER — NITROGLYCERIN 0.4 MG SL SUBL: 0.4000 mg | SUBLINGUAL_TABLET | SUBLINGUAL | Status: DC | PRN

## 2012-08-07 MED ORDER — ACETAMINOPHEN 325 MG PO TABS
650.0000 mg | ORAL_TABLET | Freq: Four times a day (QID) | ORAL | Status: DC | PRN
  Administered 2012-08-07 – 2012-08-13 (×9): 650 mg via ORAL

## 2012-08-07 MED ORDER — LEVOTHYROXINE SODIUM 100 MCG PO TABS
100.0000 ug | ORAL_TABLET | Freq: Every day | ORAL | Status: DC
  Administered 2012-08-08 – 2012-08-14 (×7): 100 ug via ORAL
  Filled 2012-08-07 (×8): qty 1

## 2012-08-07 MED ORDER — MAGNESIUM HYDROXIDE 400 MG/5ML PO SUSP: 30.0000 mL | Freq: Every day | ORAL | Status: DC | PRN

## 2012-08-07 MED ORDER — ALPRAZOLAM 1 MG PO TABS
1.0000 mg | ORAL_TABLET | Freq: Three times a day (TID) | ORAL | Status: DC | PRN
  Administered 2012-08-07 – 2012-08-11 (×12): 1 mg via ORAL
  Filled 2012-08-07 (×12): qty 1

## 2012-08-07 MED ORDER — QUETIAPINE FUMARATE 25 MG PO TABS
25.0000 mg | ORAL_TABLET | Freq: Two times a day (BID) | ORAL | Status: DC
  Administered 2012-08-08 – 2012-08-09 (×3): 25 mg via ORAL
  Filled 2012-08-07 (×8): qty 1

## 2012-08-07 MED ORDER — DULOXETINE HCL 60 MG PO CPEP
60.0000 mg | ORAL_CAPSULE | Freq: Every day | ORAL | Status: DC
  Administered 2012-08-08 – 2012-08-14 (×7): 60 mg via ORAL
  Filled 2012-08-07 (×8): qty 1

## 2012-08-07 MED ORDER — TRIAMTERENE-HCTZ 37.5-25 MG PO TABS
1.0000 | ORAL_TABLET | Freq: Every day | ORAL | Status: DC
  Administered 2012-08-08 – 2012-08-14 (×7): 1 via ORAL
  Filled 2012-08-07 (×8): qty 1

## 2012-08-07 MED ORDER — ALUM & MAG HYDROXIDE-SIMETH 200-200-20 MG/5ML PO SUSP: 30.0000 mL | ORAL | Status: DC | PRN

## 2012-08-07 MED ORDER — ZOLPIDEM TARTRATE 10 MG PO TABS
5.0000 mg | ORAL_TABLET | Freq: Every evening | ORAL | Status: DC | PRN
  Administered 2012-08-07 – 2012-08-08 (×2): 5 mg via ORAL
  Filled 2012-08-07 (×2): qty 1

## 2012-08-07 NOTE — Tx Team (Signed)
Initial Interdisciplinary Treatment Plan  PATIENT STRENGTHS: (choose at least two) Ability for insight Average or above average intelligence Communication skills General fund of knowledge Motivation for treatment/growth Physical Health Religious Affiliation Supportive family/friends Work skills  PATIENT STRESSORS: Financial difficulties   PROBLEM LIST: Problem List/Patient Goals Date to be addressed Date deferred Reason deferred Estimated date of resolution  Suicidal ideation 08/07/2012   D/c        depression 08/07/2012   D/c        anxiety 08/07/2012   D/c        hopelessness 08/07/2012   D/c               DISCHARGE CRITERIA:  Ability to meet basic life and health needs Improved stabilization in mood, thinking, and/or behavior Motivation to continue treatment in a less acute level of care Need for constant or close observation no longer present Reduction of life-threatening or endangering symptoms to within safe limits Safe-care adequate arrangements made Verbal commitment to aftercare and medication compliance  PRELIMINARY DISCHARGE PLAN: Attend aftercare/continuing care group Attend PHP/IOP Return to previous living arrangement Return to previous work or school arrangements  PATIENT/FAMIILY INVOLVEMENT: This treatment plan has been presented to and reviewed with the patient, Christine Robles.  The patient and family have been given the opportunity to ask questions and make suggestions.  Christine Robles 08/07/2012, 6:38 PM
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
## 2012-08-07 NOTE — Progress Notes (Signed)
New admit; Pt did not attend group, stayed in room.
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
## 2012-08-08 DIAGNOSIS — F332 Major depressive disorder, recurrent severe without psychotic features: Principal | ICD-10-CM

## 2012-08-08 DIAGNOSIS — F4310 Post-traumatic stress disorder, unspecified: Secondary | ICD-10-CM

## 2012-08-08 LAB — URINALYSIS, ROUTINE W REFLEX MICROSCOPIC
Ketones, ur: NEGATIVE mg/dL
Nitrite: NEGATIVE
Protein, ur: NEGATIVE mg/dL
pH: 6.5 (ref 5.0–8.0)

## 2012-08-08 LAB — RAPID URINE DRUG SCREEN, HOSP PERFORMED: Barbiturates: NOT DETECTED

## 2012-08-08 LAB — TSH: TSH: 3.08 u[IU]/mL (ref 0.350–4.500)

## 2012-08-08 LAB — URINE MICROSCOPIC-ADD ON

## 2012-08-08 LAB — PREGNANCY, URINE: Preg Test, Ur: NEGATIVE

## 2012-08-08 NOTE — Progress Notes (Signed)
Patient ID: Christine Robles, female   DOB: 02-06-1956, 57 y.o.   MRN: [id number]  D: Patient sitting in dayroom talking with peers on approach. Patient c/o this ongoing headache and got a prn for it. Reports mood improved since admission. Currently denies any SI/HI. Reports feeling anxious a lot during the day and got medication for it.  A: Staff will monitor on q 15 minute checks, follow treatment plan, and give meds as ordered.  R: Interacting well with peers and has been cooperative on the unit.

## 2012-08-08 NOTE — Progress Notes (Signed)
[facility] LCSW Group Therapy  Feelings Around Relapse 1:15 - 2:30  08/08/2012 4:34 PM  Type of Therapy:  Group Therapy  Participation Level:  Minimal  Participation Quality:  Appropriate  Affect:  Appropriate, Depressed and Flat  Cognitive:  Appropriate  Insight:  Developing/Improving  Engagement in Therapy:  Developing/Improving  Modes of Intervention:  Discussion, Problem-solving, Rapport Building and Support  Summary of Progress/Problems:  Patient listened attentively to group discussion but did not offer any comments.  Wynn Banker 08/08/2012, 4:34 PM
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
## 2012-08-08 NOTE — Progress Notes (Signed)
D) Pt rates her depression and her hopelessness both at a 10. Admits to thoughts of SI and denies HI. Patient states that she has had a headache for several days now and takes pain medications to get rid of it. Was given hydrocodone at 1117 for her headache and given Xanax both at breakfast and lunch. Did attend the groups this morning, but not this afternoon, due to her headache. A) Given support. Verbal contract made with Pt for her safety. Given reassurance and praise for coming to the hospital R) contracts for her safety while in the hospital.
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
## 2012-08-08 NOTE — Progress Notes (Signed)
Recreation Therapy Notes  Date: 03.07.2014  Time: 3:00pm  Location: Art Room   Group Topic/Focus: Leisure Education   Participation Level:  Did not attend - patient stated she had a bad headache and that she had just gotten to lay down. Patient requested to be able to stay in bed.   Marykay Lex Blanchfield, LRT/CTRS        Blanchfield, Denise L 08/08/2012 4:21 PM

## 2012-08-09 DIAGNOSIS — F3289 Other specified depressive episodes: Secondary | ICD-10-CM

## 2012-08-09 DIAGNOSIS — F329 Major depressive disorder, single episode, unspecified: Secondary | ICD-10-CM

## 2012-08-09 MED ORDER — ZOLPIDEM TARTRATE 10 MG PO TABS
10.0000 mg | ORAL_TABLET | Freq: Every day | ORAL | Status: DC
  Administered 2012-08-09 – 2012-08-10 (×2): 10 mg via ORAL
  Filled 2012-08-09 (×2): qty 1

## 2012-08-09 MED ORDER — QUETIAPINE FUMARATE 100 MG PO TABS
100.0000 mg | ORAL_TABLET | Freq: Every day | ORAL | Status: DC
  Administered 2012-08-09 – 2012-08-13 (×5): 100 mg via ORAL
  Filled 2012-08-09 (×7): qty 1

## 2012-08-09 MED ORDER — BENZTROPINE MESYLATE 0.5 MG PO TABS: 0.5000 mg | ORAL_TABLET | Freq: Two times a day (BID) | ORAL | Status: DC | PRN

## 2012-08-09 NOTE — Progress Notes (Signed)
[facility] Group Notes:  (Nursing/MHT/Case Management/Adjunct)  Date:  08/08/2012 Time:  2000  Type of Therapy:  Psychoeducational Skills  Participation Level:  Active  Participation Quality:  Appropriate  Affect:  Appropriate  Cognitive:  Appropriate  Insight:  Good  Engagement in Group:  Engaged  Modes of Intervention:  Education  Summary of Progress/Problems: The patient stated in group that she had a bad day since she had to deal with a headache. She states that she has had the same headache since being admitted to the hospital. Her day did however improve once she began to socialize with her peers. Her goal for tomorrow is to attend more groups, deal with the headache, and to have "better thoughts".  GOODMAN, BENJAMIN S 08/09/2012, 1:07 AM

## 2012-08-09 NOTE — Progress Notes (Signed)
D) Pt rates her depression and her hopelessness both at an 8. Deneis SI and HI. Receives her Xanax throughout the day as well as her pain medications. Has attended the program and interacts with her peers appropriately. A) Given support, reassurance and praise. Encouraged to do her homework and to write positive things about herself. R) Denies SI and HI.

## 2012-08-09 NOTE — Progress Notes (Signed)
[facility] MD Progress Note  08/09/2012 4:59 PM Christine Robles  MRN:  [id number] Subjective:  Pt was very depressed.  Bothered by daughter and granddaughter using alcohol and drugs. Suicidal thoughts of wrecking a car, feared seious self-injury Diagnosis:   Axis I: Depressive Disorder NOS Axis II: Deferred Axis III:  Past Medical History  Diagnosis Date  . Hypertension   . Anxiety   . Depression   . Chronic back pain   . Arthritis   . GERD (gastroesophageal reflux disease)   . Thyroid disease     hypothyroidism  . Ventral hernia   . Hypothyroidism    Axis IV: economic problems, occupational problems and problems related to legal system/crime Axis V: 41-50 serious symptoms  ADL's:  Intact  Sleep: Fair  Appetite:  Good  Suicidal Ideation:  Plan:  no active plan Homicidal Ideation:  Plan:  No active plan AEB (as evidenced by):  Psychiatric Specialty Exam: Review of Systems  Cardiovascular: Positive for claudication.    Blood pressure 135/85, pulse 82, temperature 98.8 F (37.1 C), temperature source Oral, resp. rate 18, height 5\' 7"  (1.702 m), weight 119.296 kg (263 lb), last menstrual period 08/08/1994, SpO2 99.00%.Body mass index is 41.18 kg/(m^2).  General Appearance: Casual  Eye Contact::  Good  Speech:  Clear and Coherent  Volume:  Normal  Mood:  Depressed  Affect:  Congruent  Thought Process:  Coherent, Goal Directed, Intact and Logical  Orientation:  Full (Time, Place, and Person)  Thought Content:  unable to be self assertive with husband and now wants to  Suicidal Thoughts:  No  Homicidal Thoughts:  No  Memory:  Immediate;   Fair Recent;   Fair Remote;   Good  Judgement:  Fair  Insight:  Fair  Psychomotor Activity:  Normal  Concentration:  Good  Recall:  Good  Akathisia:  NA  Handed:  Right  AIMS (if indicated):     Assets:  Communication Skills Desire for Improvement Financial Resources/Insurance Housing Intimacy Social Support  Sleep:  Number of  Hours: 5.5   Current Medications: Current Facility-Administered Medications  Medication Dose Route Frequency Provider Last Rate Last Dose  . acetaminophen (TYLENOL) tablet 650 mg  650 mg Oral Q6H PRN Rachael Fee, MD   650 mg at 08/07/12 1850  . ALPRAZolam Prudy Feeler) tablet 1 mg  1 mg Oral TID PRN Rachael Fee, MD   1 mg at 08/09/12 1259  . alum & mag hydroxide-simeth (MAALOX/MYLANTA) 200-200-20 MG/5ML suspension 30 mL  30 mL Oral Q4H PRN Rachael Fee, MD      . DULoxetine (CYMBALTA) DR capsule 60 mg  60 mg Oral Daily Rachael Fee, MD   60 mg at 08/09/12 7829  . HYDROcodone-acetaminophen (NORCO/VICODIN) 5-325 MG per tablet 1 tablet  1 tablet Oral Q6H PRN Rachael Fee, MD   1 tablet at 08/09/12 1132  . levothyroxine (SYNTHROID, LEVOTHROID) tablet 100 mcg  100 mcg Oral QAC breakfast Rachael Fee, MD   100 mcg at 08/09/12 [phone]  . magnesium hydroxide (MILK OF MAGNESIA) suspension 30 mL  30 mL Oral Daily PRN Rachael Fee, MD      . nitroGLYCERIN (NITROSTAT) SL tablet 0.4 mg  0.4 mg Sublingual Q5 min PRN Rachael Fee, MD      . QUEtiapine (SEROQUEL) tablet 25 mg  25 mg Oral BID Rachael Fee, MD   25 mg at 08/09/12 3086  . triamterene-hydrochlorothiazide (MAXZIDE-25) 37.5-25 MG per tablet 1 each  1 each Oral Daily Rachael Fee, MD   1 each at 08/09/12 [phone]  . zolpidem (AMBIEN) tablet 5 mg  5 mg Oral QHS PRN Rachael Fee, MD   5 mg at 08/08/12 2155    Lab Results:  Results for orders placed during the hospital encounter of 08/07/12 (from the past 48 hour(s))  CBC     Status: None   Collection Time    08/07/12  7:48 PM      Result Value Range   WBC 5.8  4.0 - 10.5 K/uL   RBC 4.78  3.87 - 5.11 MIL/uL   Hemoglobin 13.3  12.0 - 15.0 g/dL   HCT 96.0  45.4 - 09.8 %   MCV 82.0  78.0 - 100.0 fL   MCH 27.8  26.0 - 34.0 pg   MCHC 33.9  30.0 - 36.0 g/dL   RDW 11.9  14.7 - 82.9 %   Platelets 339  150 - 400 K/uL  COMPREHENSIVE METABOLIC PANEL     Status: Abnormal   Collection Time    08/07/12   7:48 PM      Result Value Range   Sodium 137  135 - 145 mEq/L   Potassium 3.7  3.5 - 5.1 mEq/L   Chloride 99  96 - 112 mEq/L   CO2 26  19 - 32 mEq/L   Glucose, Bld 100 (*) 70 - 99 mg/dL   BUN 13  6 - 23 mg/dL   Creatinine, Ser 5.62  0.50 - 1.10 mg/dL   Calcium 9.1  8.4 - 13.0 mg/dL   Total Protein 6.9  6.0 - 8.3 g/dL   Albumin 3.5  3.5 - 5.2 g/dL   AST 22  0 - 37 U/L   ALT 40 (*) 0 - 35 U/L   Alkaline Phosphatase 92  39 - 117 U/L   Total Bilirubin 0.2 (*) 0.3 - 1.2 mg/dL   GFR calc non Af Amer >90  >90 mL/min   GFR calc Af Amer >90  >90 mL/min   Comment:            The eGFR has been calculated     using the CKD EPI equation.     This calculation has not been     validated in all clinical     situations.     eGFR's persistently     <90 mL/min signify     possible Chronic Kidney Disease.  ETHANOL     Status: None   Collection Time    08/07/12  7:48 PM      Result Value Range   Alcohol, Ethyl (B) <11  0 - 11 mg/dL   Comment:            LOWEST DETECTABLE LIMIT FOR     SERUM ALCOHOL IS 11 mg/dL     FOR MEDICAL PURPOSES ONLY  TSH     Status: None   Collection Time    08/07/12  7:48 PM      Result Value Range   TSH 3.080  0.350 - 4.500 uIU/mL  URINALYSIS, ROUTINE W REFLEX MICROSCOPIC     Status: Abnormal   Collection Time    08/07/12  8:59 PM      Result Value Range   Color, Urine YELLOW  YELLOW   APPearance CLOUDY (*) CLEAR   Specific Gravity, Urine 1.019  1.005 - 1.030   pH 6.5  5.0 - 8.0   Glucose, UA NEGATIVE  NEGATIVE mg/dL  Hgb urine dipstick NEGATIVE  NEGATIVE   Bilirubin Urine NEGATIVE  NEGATIVE   Ketones, ur NEGATIVE  NEGATIVE mg/dL   Protein, ur NEGATIVE  NEGATIVE mg/dL   Urobilinogen, UA 0.2  0.0 - 1.0 mg/dL   Nitrite NEGATIVE  NEGATIVE   Leukocytes, UA SMALL (*) NEGATIVE  PREGNANCY, URINE     Status: None   Collection Time    08/07/12  8:59 PM      Result Value Range   Preg Test, Ur NEGATIVE  NEGATIVE   Comment:            THE SENSITIVITY OF THIS      METHODOLOGY IS >20 mIU/mL.  URINE RAPID DRUG SCREEN (HOSP PERFORMED)     Status: Abnormal   Collection Time    08/07/12  8:59 PM      Result Value Range   Opiates NONE DETECTED  NONE DETECTED   Cocaine NONE DETECTED  NONE DETECTED   Benzodiazepines POSITIVE (*) NONE DETECTED   Amphetamines NONE DETECTED  NONE DETECTED   Tetrahydrocannabinol NONE DETECTED  NONE DETECTED   Barbiturates NONE DETECTED  NONE DETECTED   Comment:            DRUG SCREEN FOR MEDICAL PURPOSES     ONLY.  IF CONFIRMATION IS NEEDED     FOR ANY PURPOSE, NOTIFY LAB     WITHIN 5 DAYS.                LOWEST DETECTABLE LIMITS     FOR URINE DRUG SCREEN     Drug Class       Cutoff (ng/mL)     Amphetamine      1000     Barbiturate      200     Benzodiazepine   200     Tricyclics       300     Opiates          300     Cocaine          300     THC              50  URINE MICROSCOPIC-ADD ON     Status: Abnormal   Collection Time    08/07/12  8:59 PM      Result Value Range   Squamous Epithelial / LPF RARE  RARE   WBC, UA 0-2  <3 WBC/hpf   Bacteria, UA FEW (*) RARE    Physical Findings: AIMS: Facial and Oral Movements Muscles of Facial Expression: None, normal Lips and Perioral Area: None, normal Jaw: None, normal Tongue: None, normal,Extremity Movements Upper (arms, wrists, hands, fingers): None, normal Lower (legs, knees, ankles, toes): None, normal, Trunk Movements Neck, shoulders, hips: None, normal, Overall Severity Severity of abnormal movements (highest score from questions above): None, normal Incapacitation due to abnormal movements: None, normal Patient's awareness of abnormal movements (rate only patient's report): No Awareness, Dental Status Current problems with teeth and/or dentures?: No Does patient usually wear dentures?: No  CIWA:  CIWA-Ar Total: 6 COWS:  COWS Total Score: 3  Treatment Plan Summary: Pt will report any suicidal thoughts.  She will report any adverse effect for  medications  She will report any suicidal thoughts at least two days before discharge.  Plan:  Medical Decision Making Problem Points:  Established problem, stable/improving (1) Data Points:  Review or order medicine tests (1)  I certify that inpatient services furnished can reasonably be expected to improve the  patient's condition.   BOGARD, PHYLLIS 08/09/2012, 4:59 PM

## 2012-08-09 NOTE — Clinical Social Work Note (Signed)
[facility] Group Notes:  (Clinical Social Work)  08/09/2012   3:00-4:00PM  Summary of Progress/Problems:   The main focus of today's process group was for the patient to identify something in their life that led to their hospitalization that they would like to change, then to discuss their motivation to change.  The Stages of Change were explained to the group, then each patient identified where they are in that process.  Scale was used with motivational interviewing to determine the patient's current motivation to change, with 1 being total lack of motivation and 10 being total commitment to change.  The patient expressed that she is "tired of letting other people control my emotions."  Her motivation to change is 5 out of 10.  Type of Therapy:  Process Group  Participation Level:  Active  Participation Quality:  Attentive  Affect:  Blunted  Cognitive:  Appropriate and Oriented  Insight:  Developing/Improving  Engagement in Therapy:  Engaged  Modes of Intervention:  Clarification, Support and Processing, Exploration, Discussion   Ambrose Mantle, LCSW 08/09/2012, 4:47 PM

## 2012-08-10 DIAGNOSIS — J020 Streptococcal pharyngitis: Secondary | ICD-10-CM | POA: Diagnosis not present

## 2012-08-10 DIAGNOSIS — F4321 Adjustment disorder with depressed mood: Secondary | ICD-10-CM

## 2012-08-10 LAB — RAPID STREP SCREEN (MED CTR MEBANE ONLY): Streptococcus, Group A Screen (Direct): NEGATIVE

## 2012-08-10 NOTE — Progress Notes (Signed)
Psychoeducational Group Note  Date:  08/10/2012 Time: 1015 Group Topic/Focus:  Making Healthy Choices:   The focus of this group is to help patients identify negative/unhealthy choices they were using prior to admission and identify positive/healthier coping strategies to replace them upon discharge.  Participation Level:  Active  Participation Quality:  Appropriate  Affect:  Appropriate  Cognitive:  Appropriate  Insight:  Engaged  Engagement in Group:  Engaged  Additional Comments:    Rich Brave 6:41 PM. 08/10/2012

## 2012-08-10 NOTE — Progress Notes (Signed)
[facility] Group Notes:  (Nursing/MHT/Case Management/Adjunct)  Date:  08/09/2012 Time:  2000  Type of Therapy:  Psychoeducational Skills  Participation Level:  Active  Participation Quality:  Attentive  Affect:  Blunted  Cognitive:  Appropriate  Insight:  Good  Engagement in Group:  Improving  Modes of Intervention:  Education  Summary of Progress/Problems: The patient stated in group that she attended all of her groups today despite not feeling well (cold symptoms). According to the patient, she had a bad visit with her mother. Her goal for tomorrow is to attend more of her groups and to express more of her feelings.   GOODMAN, BENJAMIN S 08/10/2012, 3:12 AM

## 2012-08-10 NOTE — Clinical Social Work Note (Signed)
[facility] Group Notes:  (Clinical Social Work)  08/10/2012   3:00-4:00PM  Summary of Progress/Problems:    The main focus of today's process group was to define "support" and describe what healthy supports are.  We then discussed how and why to increase patient supports, using motivational interviewing.  An emphasis was placed on using counselor, doctor, therapy groups, self-help groups and problem-specific support groups to expand supports.   The patient expressed little during group but was smiling and relaxed and nodding while others spoke.  Type of Therapy:  Process Group  Participation Level:  Active  Participation Quality:  Attentive and Supportive  Affect:  Appropriate  Cognitive:  Alert, Appropriate and Oriented  Insight:  Engaged  Engagement in Therapy:  Engaged  Modes of Intervention:  Education,  Support and Processing, Exploration, Discussion   Ambrose Mantle, LCSW 08/10/2012, 4:17 PM

## 2012-08-10 NOTE — Progress Notes (Signed)
D) Pt states that she is feeling a little better today but not by much. Continues to have a headache, anxiety and a sore throat with cold symptoms. Rates her depression at a 3 her hopelessness at a 2 and denies SI and HI. States she feels positive concerning the fact that her husband has been very supportive throughout this hospitalization. Wrote on her survey this morning tht she wants to "love myself . Get rid of negative thoughts and put myself first A) Given support and praise. Validated concerning her thoughts and feelings. R) Denies SI and HI. States that she is working hard on feeling better although it is hard to do when you are not feeling physically well.

## 2012-08-10 NOTE — Progress Notes (Signed)
[facility] Group Notes:  (Nursing/MHT/Case Management/Adjunct)  Date:  08/10/2012  Time:  2000  Type of Therapy:  Psychoeducational Skills  Participation Level:  Active  Participation Quality:  Attentive  Affect:  Appropriate  Cognitive:  Appropriate  Insight:  Good  Engagement in Group:  Engaged  Modes of Intervention:  Education  Summary of Progress/Problems: The  patient verbalized that she feels better today as compared to yesterday. She managed to attend more groups as compared to yesterday. In addition, she had a good talk with her husband. Her goal for tomorrow is to get discharged from the hospital.   Hazle Coca S 08/10/2012, 10:04 PM

## 2012-08-10 NOTE — Progress Notes (Signed)
Psychoeducational Group Note  Date: 08/10/2012 Time:  08/10/2012  Group Topic/Focus:  Gratefulness:  The focus of this group is to help patients identify what two things they are most grateful for in their lives. What helps ground them and to center them on their work to their recovery.  Participation Level:  Active  Participation Quality:  Appropriate  Affect:  Appropriate  Cognitive:  Appropriate  Insight:  Engaged  Engagement in Group:  Engaged  Additional Comments:    Judge, Christine A  

## 2012-08-10 NOTE — Progress Notes (Signed)
[facility] MD Progress Note  08/10/2012 8:59 AM Christine Robles  MRN:  [id number] Subjective:Pt is complaining of very sore throat and headache.  Diagnosis:  Axis I: Adjustment Disorder with Depressed Mood  ADL's:  Intact  Sleep: Fair  Appetite:  Fair  Suicidal Ideation:  Plan:  Not Present Homicidal Ideation:  Plan:  Not Present AEB (as evidenced by):  Psychiatric Specialty Exam: Review of Systems  HENT: Positive for sore throat.   Musculoskeletal: Positive for myalgias.  Neurological: Positive for headaches.    Blood pressure 94/66, pulse 106, temperature 98.1 F (36.7 C), temperature source Oral, resp. rate 18, height 5\' 7"  (1.702 m), weight 119.296 kg (263 lb), last menstrual period 08/08/1994, SpO2 99.00%.Body mass index is 41.18 kg/(m^2).  General Appearance: Casual  Eye Contact::  Good  Speech:  Clear and Coherent  Volume:  Normal  Mood:  Depressed  Affect:  Congruent  Thought Process:  Goal Directed  Orientation:  Full (Time, Place, and Person)  Thought Content:  Focused on feeling sick, sore throat, headache   Suicidal Thoughts:  No  Homicidal Thoughts:  No  Memory:  Immediate;   Good Recent;   Good Remote;   Good  Judgement:  Fair  Insight:  Fair  Psychomotor Activity:  Normal  Concentration:  Good  Recall:  Good  Akathisia:  NA  Handed:  Right  AIMS (if indicated):     Assets:  Communication Skills Desire for Improvement Housing Intimacy Leisure Time Transportation Vocational/Educational  Sleep:  Number of Hours: 6.75   Current Medications: Current Facility-Administered Medications  Medication Dose Route Frequency [name] Last Rate Last Dose  . acetaminophen (TYLENOL) tablet 650 mg  650 mg Oral Q6H PRN Rachael Fee, MD   650 mg at 08/09/12 2047  . ALPRAZolam Prudy Feeler) tablet 1 mg  1 mg Oral TID PRN Rachael Fee, MD   1 mg at 08/10/12 1324  . alum & mag hydroxide-simeth (MAALOX/MYLANTA) 200-200-20 MG/5ML suspension 30 mL  30 mL Oral Q4H PRN Rachael Fee, MD      . benztropine (COGENTIN) tablet 0.5 mg  0.5 mg Oral BID PRN Mickeal Skinner, MD      . DULoxetine (CYMBALTA) DR capsule 60 mg  60 mg Oral Daily Rachael Fee, MD   60 mg at 08/09/12 4010  . HYDROcodone-acetaminophen (NORCO/VICODIN) 5-325 MG per tablet 1 tablet  1 tablet Oral Q6H PRN Rachael Fee, MD   1 tablet at 08/10/12 0341  . levothyroxine (SYNTHROID, LEVOTHROID) tablet 100 mcg  100 mcg Oral QAC breakfast Rachael Fee, MD   100 mcg at 08/10/12 [phone]  . magnesium hydroxide (MILK OF MAGNESIA) suspension 30 mL  30 mL Oral Daily PRN Rachael Fee, MD      . nitroGLYCERIN (NITROSTAT) SL tablet 0.4 mg  0.4 mg Sublingual Q5 min PRN Rachael Fee, MD      . QUEtiapine (SEROQUEL) tablet 100 mg  100 mg Oral QHS Mickeal Skinner, MD   100 mg at 08/09/12 2047  . triamterene-hydrochlorothiazide (MAXZIDE-25) 37.5-25 MG per tablet 1 each  1 each Oral Daily Rachael Fee, MD   1 each at 08/09/12 [phone]  . zolpidem (AMBIEN) tablet 10 mg  10 mg Oral QHS Mickeal Skinner, MD   10 mg at 08/09/12 2112    Lab Results: No results found for this or any previous visit (from the past 48 hour(s)).  Physical Findings: AIMS: Facial and Oral Movements Muscles of Facial Expression: None,  normal Lips and Perioral Area: None, normal Jaw: None, normal Tongue: None, normal,Extremity Movements Upper (arms, wrists, hands, fingers): None, normal Lower (legs, knees, ankles, toes): None, normal, Trunk Movements Neck, shoulders, hips: None, normal, Overall Severity Severity of abnormal movements (highest score from questions above): None, normal Incapacitation due to abnormal movements: None, normal Patient's awareness of abnormal movements (rate only patient's report): No Awareness, Dental Status Current problems with teeth and/or dentures?: No Does patient usually wear dentures?: No  CIWA:  CIWA-Ar Total: 6 COWS:  COWS Total Score: 3  Treatment Plan Summary: Pt will need rapid test for streptococcus sore  throat, [white patches]  She will participate in metings and plans to see outpatient psychiatrist   Plan:  Medical Decision Making Problem Points:  Established problem, stable/improving (1), New problem, with additional work-up planned (4) and Review of last therapy session (1) Data Points:  Review or order medicine tests (1)  I certify that inpatient services furnished can reasonably be expected to improve the patient's condition.   BOGARD, PHYLLIS 08/10/2012, 8:59 AM

## 2012-08-10 NOTE — Progress Notes (Signed)
Patient came to medication window complaining of aching, chills, headache and throat soreness and feeling like its closing up. Patients vitals were taken and temp 98.9 bp157/94 and pulse 91. Patient received tylenol for pain and writer informed her that doctor would be notified of her symptoms. Dr. Ferol Luz notified and requested that patients throat be looked at by writer and salt water gargle be given. Patient reported that she made the doctor aware earlier but her symptoms feel worse.  Patient took hs scheduled medication and salt water given for gargling. Patients roommate is very caring and concerned about her roommate and assist her to her room. Patient offered support and encouragement. Patient denies si/hi/a/v hall.

## 2012-08-11 MED ORDER — GABAPENTIN 100 MG PO CAPS
100.0000 mg | ORAL_CAPSULE | Freq: Two times a day (BID) | ORAL | Status: DC
  Administered 2012-08-11 – 2012-08-13 (×4): 100 mg via ORAL
  Filled 2012-08-11 (×6): qty 1

## 2012-08-11 MED ORDER — ALPRAZOLAM 0.5 MG PO TABS
0.5000 mg | ORAL_TABLET | Freq: Three times a day (TID) | ORAL | Status: DC | PRN
  Administered 2012-08-12 – 2012-08-13 (×3): 0.5 mg via ORAL
  Filled 2012-08-11 (×3): qty 1

## 2012-08-11 MED ORDER — HYDROCODONE-ACETAMINOPHEN 5-325 MG PO TABS
1.0000 | ORAL_TABLET | Freq: Two times a day (BID) | ORAL | Status: DC | PRN
  Administered 2012-08-11 – 2012-08-12 (×2): 1 via ORAL
  Filled 2012-08-11 (×2): qty 1

## 2012-08-11 MED ORDER — TRAZODONE HCL 100 MG PO TABS
100.0000 mg | ORAL_TABLET | Freq: Every evening | ORAL | Status: DC | PRN
  Administered 2012-08-11 – 2012-08-13 (×3): 100 mg via ORAL
  Filled 2012-08-11 (×3): qty 1

## 2012-08-11 NOTE — Progress Notes (Signed)
[facility] MD Progress Note  08/11/2012 2:38 PM Christine Robles  MRN:  [id number] Subjective:  Patient reports being depressed and achy allover. She is interested in tapering off her pain medication and the xanax. States she is sad about her daughter and grand daughter abusing drugs, trying to cope. Diagnosis:   Axis I: Major Depression, Recurrent severe Axis II: Deferred Axis III:  Past Medical History  Diagnosis Date  . Hypertension   . Anxiety   . Depression   . Chronic back pain   . Arthritis   . GERD (gastroesophageal reflux disease)   . Thyroid disease     hypothyroidism  . Ventral hernia   . Hypothyroidism    Axis IV: other psychosocial or environmental problems Axis V: 41-50 serious symptoms  ADL's:  Intact  Sleep: Poor  Appetite:  Fair  Psychiatric Specialty Exam: Review of Systems  Constitutional: Negative.   HENT: Negative.   Eyes: Negative.   Respiratory: Negative.   Cardiovascular: Negative.   Gastrointestinal: Negative.   Genitourinary: Negative.   Musculoskeletal: Positive for myalgias and back pain.  Skin: Negative.   Neurological: Negative.   Endo/Heme/Allergies: Negative.   Psychiatric/Behavioral: Positive for depression. The patient is nervous/anxious and has insomnia.     Blood pressure 94/66, pulse 106, temperature 98.1 F (36.7 C), temperature source Oral, resp. rate 18, height 5\' 7"  (1.702 m), weight 119.296 kg (263 lb), last menstrual period 08/08/1994, SpO2 99.00%.Body mass index is 41.18 kg/(m^2).  General Appearance: Casual  Eye Contact::  Fair  Speech:  Clear and Coherent  Volume:  Normal  Mood:  Depressed and Dysphoric  Affect:  Constricted and Depressed  Thought Process:  Intact  Orientation:  Full (Time, Place, and Person)  Thought Content:  WDL  Suicidal Thoughts:  No  Homicidal Thoughts:  No  Memory:  Immediate;   Fair Recent;   Fair Remote;   Fair  Judgement:  Fair  Insight:  Present  Psychomotor Activity:  Normal   Concentration:  Fair  Recall:  Fair  Akathisia:  No  Handed:  Right  AIMS (if indicated):     Assets:  Communication Skills Desire for Improvement Housing Social Support  Sleep:  Number of Hours: 6.5   Current Medications: Current Facility-Administered Medications  Medication Dose Route Frequency Provider Last Rate Last Dose  . acetaminophen (TYLENOL) tablet 650 mg  650 mg Oral Q6H PRN Rachael Fee, MD   650 mg at 08/10/12 2112  . ALPRAZolam (XANAX) tablet 0.5 mg  0.5 mg Oral TID PRN Himabindu Ravi, MD      . alum & mag hydroxide-simeth (MAALOX/MYLANTA) 200-200-20 MG/5ML suspension 30 mL  30 mL Oral Q4H PRN Rachael Fee, MD      . benztropine (COGENTIN) tablet 0.5 mg  0.5 mg Oral BID PRN Mickeal Skinner, MD      . DULoxetine (CYMBALTA) DR capsule 60 mg  60 mg Oral Daily Rachael Fee, MD   60 mg at 08/11/12 0816  . HYDROcodone-acetaminophen (NORCO/VICODIN) 5-325 MG per tablet 1 tablet  1 tablet Oral BID PRN Himabindu Ravi, MD      . levothyroxine (SYNTHROID, LEVOTHROID) tablet 100 mcg  100 mcg Oral QAC breakfast Rachael Fee, MD   100 mcg at 08/11/12 [phone]  . magnesium hydroxide (MILK OF MAGNESIA) suspension 30 mL  30 mL Oral Daily PRN Rachael Fee, MD      . nitroGLYCERIN (NITROSTAT) SL tablet 0.4 mg  0.4 mg Sublingual Q5 min PRN Madie Reno  A Lugo, MD      . QUEtiapine (SEROQUEL) tablet 100 mg  100 mg Oral QHS Mickeal Skinner, MD   100 mg at 08/10/12 1958  . traZODone (DESYREL) tablet 100 mg  100 mg Oral QHS PRN Himabindu Ravi, MD      . triamterene-hydrochlorothiazide (MAXZIDE-25) 37.5-25 MG per tablet 1 each  1 each Oral Daily Rachael Fee, MD   1 each at 08/11/12 [phone]    Lab Results:  Results for orders placed during the hospital encounter of 08/07/12 (from the past 48 hour(s))  RAPID STREP SCREEN     Status: None   Collection Time    08/10/12  9:22 AM      Result Value Range   Streptococcus, Group A Screen (Direct) NEGATIVE  NEGATIVE   Comment:            DUE TO INADEQUATE  SENSITIVITY OF EIA     RAPID TESTS FOR GROUP A STREP (GAS)     IT IS RECOMMENDED THAT ALL NEGATIVE     RESULTS BE FOLLOWED BY A     GROUP A STREP PROBE.    Physical Findings: AIMS: Facial and Oral Movements Muscles of Facial Expression: None, normal Lips and Perioral Area: None, normal Jaw: None, normal Tongue: None, normal,Extremity Movements Upper (arms, wrists, hands, fingers): None, normal Lower (legs, knees, ankles, toes): None, normal, Trunk Movements Neck, shoulders, hips: None, normal, Overall Severity Severity of abnormal movements (highest score from questions above): None, normal Incapacitation due to abnormal movements: None, normal Patient's awareness of abnormal movements (rate only patient's report): No Awareness, Dental Status Current problems with teeth and/or dentures?: No Does patient usually wear dentures?: No  CIWA:  CIWA-Ar Total: 6 COWS:  COWS Total Score: 3  Treatment Plan Summary: Daily contact with patient to assess and evaluate symptoms and progress in treatment Medication management  Plan: Decrease oxycodone dosing to twice daily as needed. Decrease Xanax dosing to twice daily as needed. Discontinue ambien, start Trazodone at 100mg  po qhs for sleep. Start Neurontin at 100mg  po tid for pain.   Medical Decision Making Problem Points:  Established problem, stable/improving (1), Review of last therapy session (1) and Review of psycho-social stressors (1) Data Points:  Review of medication regiment & side effects (2) Review of new medications or change in dosage (2)  I certify that inpatient services furnished can reasonably be expected to improve the patient's condition.   RAVI, HIMABINDU 08/11/2012, 2:38 PM

## 2012-08-11 NOTE — Progress Notes (Signed)
Patient has been up in the dayroom laughing and talking with peers. Patient attended group and participated Patient reports feeling a little better today and has found herself laughing a lot today. Patient compliant with medication. Patient c/o her throat still feeling a little sore. Patient hopes to discharge on tomorrow. Writer encouraged patient to address this concern with her doctor. Patient denies si/hi/a/v hallucinations. Support and encouragement offered, safety maintained on unit with 15 min checks, will continue to monitor.

## 2012-08-11 NOTE — Progress Notes (Signed)
The focus of this group is to help patients review their daily goal of treatment and discuss progress on daily workbooks. Pt attended the evening group session and participated in the discussion. Pt reported having a good day, the highlight of which was a roleplaying game used in a group by her CSW. Pt said that this made her realize how much of a "people pleaser" she was and that this was not a good behavior for her. Pt also stated she had some anxiety about changing up her meds, but needed to have faith that they would work. Pt smiled throughout group and appeared engaged.

## 2012-08-11 NOTE — Tx Team (Signed)
.  Interdisciplinary Treatment Plan Update   Date Reviewed:  08/11/2012  Time Reviewed:  11:01 AM  Progress in Treatment:   Attending groups: Yes Participating in groups: Yes Taking medication as prescribed: Yes  Tolerating medication: Yes Family/Significant other contact made: No, but contact to be made with husband. Patient understands diagnosis: Yes  Discussing patient identified problems/goals with staff: Yes Medical problems stabilized or resolved: Yes Denies suicidal/homicidal ideation: Yes Patient has not harmed self or others: Yes  For review of initial/current patient goals, please see plan of care.  Estimated Length of Stay:    Reasons for Continued Hospitalization:  Anxiety Depression Medication stabilization  New Problems/Goals identified:    Discharge Plan or Barriers:   Home with outpatient follow up  Additional Comments:  Patient is denying SI/HI.  She rates depression at three and anxiety at four.  Attendees:  Patient:  08/11/2012 11:01 AM   Signature: Patrick North, MD 08/11/2012 11:01 AM  Signature: 08/11/2012 11:01 AM  Signature: Waynetta Sandy, RN  08/11/2012 11:01 AM  Signature:Beverly Terrilee Croak, RN 08/11/2012 11:01 AM  Signature:  Fransisca Kaufmann RN 08/11/2012 11:01 AM  Signature:  Juline Patch, LCSW 08/11/2012 11:01 AM  Signature:  08/11/2012 11:01 AM  Signature: 08/11/2012 11:01 AM  Signature:    Signature:    Signature:    Signature:      Scribe for Treatment Team:   Juline Patch,  08/11/2012 11:01 AM

## 2012-08-11 NOTE — Progress Notes (Signed)
[facility] LCSW Group Therapy  08/11/2012 3:24 PM  Type of Therapy:  Group Therapy  Participation Level:  Active  Participation Quality:  Appropriate and Attentive  Affect:  Appropriate  Cognitive:  Appropriate  Insight:  Engaged  Engagement in Therapy:  Engaged  Modes of Intervention:  Discussion, Exploration, Problem-solving, Rapport Building and Support  Summary of Progress/Problems:  Patient reports the obstacle she is overcoming is being a people pleaser.  She reports she has been working on saying no to daughter who is not working or looking for a job and coming to her for money.   Patient shared she has been saying no to daughter during conversations over th weekend and it feels good.  Wynn Banker 08/11/2012, 3:24 PM

## 2012-08-11 NOTE — Progress Notes (Signed)
Patient ID: Christine Robles, female   DOB: 1956/01/18, 57 y.o.   MRN: [id number] D: "I'm Ok" A: Pt. denies lethality and A/V/H's.  Pt. Did not ask for any PRN's at this time, though she said she is still concerned about her URI-type symptoms..  Pt. Is able to smile and interact appropriately with staff and peers and attends groups.  Affect and mood are congruent and Pt. Is pleasant. R: Will continue to monitor for changes.

## 2012-08-11 NOTE — Progress Notes (Signed)
Adult Psychoeducational Group Note  Date:  08/11/2012 Time:  1:30 PM  Group Topic/Focus:  Wellness Toolbox:   The focus of this group is to discuss various aspects of wellness, balancing those aspects and exploring ways to increase the ability to experience wellness.  Patients will create a wellness toolbox for use upon discharge.  Participation Level:  None  Participation Quality:  Resistant  Affect:  Appropriate  Cognitive:  Appropriate  Insight: None  Engagement in Group:  None  Modes of Intervention:  Discussion  Additional Comments:  Pt had no interactions while attending group. Pt was appropriate but resistant to interacting in group.   [name] 08/11/2012, 1:30 PM

## 2012-08-11 NOTE — Progress Notes (Signed)
D: Patient in the hallway on approach.  Patient states her day was ok.  Patient state she is learning to stand up for herself.  Patient states she needs to learn to stop pleasing people.  Patient denies SI/HI and denies AVH A: Staff to monitor Q 15 mins for safety.  Encouragement and support offered.  Scheduled medications administered per orders.  Hydrocodone prn given for pain.  Trazodone administered prn for sleep.   R: Patient remains safe on the unit.  Patient attended group tonight.  Patient states she is congested and states her throat is sore.  Patient states her pain has decreased.

## 2012-08-12 MED ORDER — PHENOL 1.4 % MT LIQD
1.0000 | OROMUCOSAL | Status: DC | PRN
  Administered 2012-08-12 – 2012-08-14 (×11): 1 via OROMUCOSAL
  Filled 2012-08-12 (×2): qty 177

## 2012-08-12 NOTE — Progress Notes (Signed)
[facility] Group Notes:  (Nursing/MHT/Case Management/Adjunct)  Date:  08/12/2012  Time:  1:38 PM  Type of Therapy:  Psychoeducational Skills  Participation Level:  Did Not Attend  Participation Quality:  Did not attend  Affect:  Did not attend  Cognitive:  Did not attend  Insight:  None  Engagement in Group:  Did not attend  Modes of Intervention:  Did not attend  Summary of Progress/Problems:  Floria Raveling 08/12/2012, 1:38 PM

## 2012-08-12 NOTE — Progress Notes (Signed)
[facility] INPATIENT:  Family/Significant Other Suicide Prevention Education  Suicide Prevention Education:  Education Completed; Dayonna Selbe, Husband, [phone] has been identified by the patient as the family member/significant other with whom the patient will be residing, and identified as the person(s) who will aid the patient in the event of a mental health crisis (suicidal ideations/suicide attempt).  With written consent from the patient, the family member/significant other has been provided the following suicide prevention education, prior to the and/or following the discharge of the patient.  The suicide prevention education provided includes the following:  Suicide risk factors  Suicide prevention and interventions  National Suicide Hotline telephone number  [facility] assessment telephone number  [facility] Emergency Assistance 911  [facility] and/or Residential Mobile Crisis Unit telephone number  Request made of family/significant other to:  Remove weapons (e.g., guns, rifles, knives), all items previously/currently identified as safety concern.  Husband reports guns have been removed from the home.  Remove drugs/medications (over-the-counter, prescriptions, illicit drugs), all items previously/currently identified as a safety concern.  The family member/significant other verbalizes understanding of the suicide prevention education information provided.  The family member/significant other agrees to remove the items of safety concern listed above.  Wynn Banker 08/12/2012, 12:29 PM

## 2012-08-12 NOTE — Progress Notes (Signed)
[facility] LCSW Group Therapy  Feelings Around Diagnosis  08/12/2012 2:51 PM  Type of Therapy:  Group Therapy  Participation Level:  Did Not Attend   Wynn Banker 08/12/2012, 2:51 PM

## 2012-08-12 NOTE — Progress Notes (Signed)
[facility] MD Progress Note  08/12/2012 1:48 PM Christine Robles  MRN:  [id number] Subjective:  Patient in bed this afternoon complaining of discomfort from cold symptoms. She stated "I went to the groups yesterday but today am just too tired." Rates her depression at 5 and feels that it is decreasing. Reports sleeping poor from physical symptoms. She is eating small amount at meals. Denies SI/HI. She is able to discuss coping skills for depression learned from groups. Patient talked about being too much of a "people pleaser" and is learning to put herself first. Patient stated "A lady that I have been lending money actually has more than me. I will have no problem saying no in the future." She reports her husband has been supportive of her during hospital stay. Patient inquires about her new medication changes and expresses optimism about being tapered off xanax.  Diagnosis:   Axis I: Major Depression, Recurrent severe Axis II: Deferred Axis III:  Past Medical History  Diagnosis Date  . Hypertension   . Anxiety   . Depression   . Chronic back pain   . Arthritis   . GERD (gastroesophageal reflux disease)   . Thyroid disease     hypothyroidism  . Ventral hernia   . Hypothyroidism    Axis IV: other psychosocial or environmental problems Axis V: 41-50 serious symptoms  ADL's:  Intact  Sleep: Fair  Appetite:  Fair  Suicidal Ideation:  Denies Homicidal Ideation:  Denies  AEB (as evidenced by): Psychiatric Specialty Exam: Review of Systems  Constitutional: Positive for malaise/fatigue.  HENT: Positive for sore throat.   Eyes: Negative.   Respiratory: Cough: Clear mucus.   Cardiovascular: Negative.   Gastrointestinal: Negative.   Genitourinary: Negative.   Musculoskeletal: Positive for myalgias.  Skin: Negative.   Neurological: Negative.   Endo/Heme/Allergies: Negative.   Psychiatric/Behavioral: Positive for depression. Negative for suicidal ideas.    Blood pressure 140/83, pulse  100, temperature 98.8 F (37.1 C), temperature source Oral, resp. rate 16, height 5\' 7"  (1.702 m), weight 119.296 kg (263 lb), last menstrual period 08/08/1994, SpO2 99.00%.Body mass index is 41.18 kg/(m^2).  General Appearance: Disheveled  Eye Contact::  Good  Speech:  Clear and Coherent  Volume:  Normal  Mood:  Depressed  Affect:  Flat  Thought Process:  Circumstantial  Orientation:  Full (Time, Place, and Person)  Thought Content:  WDL  Suicidal Thoughts:  No  Homicidal Thoughts:  No  Memory:  Immediate;   Fair Recent;   Fair Remote;   Fair  Judgement:  Good  Insight:  Good  Psychomotor Activity:  Normal  Concentration:  Fair  Recall:  Good  Akathisia:  Negative  Handed:  Right  AIMS (if indicated):     Assets:  Architect Resilience Social Support Talents/Skills  Sleep:  Number of Hours: 5.5   Current Medications: Current Facility-Administered Medications  Medication Dose Route Frequency Provider Last Rate Last Dose  . acetaminophen (TYLENOL) tablet 650 mg  650 mg Oral Q6H PRN Rachael Fee, MD   650 mg at 08/12/12 1220  . ALPRAZolam Prudy Feeler) tablet 0.5 mg  0.5 mg Oral TID PRN Himabindu Ravi, MD   0.5 mg at 08/12/12 0212  . alum & mag hydroxide-simeth (MAALOX/MYLANTA) 200-200-20 MG/5ML suspension 30 mL  30 mL Oral Q4H PRN Rachael Fee, MD      . benztropine (COGENTIN) tablet 0.5 mg  0.5 mg Oral BID PRN Mickeal Skinner, MD      .  DULoxetine (CYMBALTA) DR capsule 60 mg  60 mg Oral Daily Rachael Fee, MD   60 mg at 08/12/12 0804  . gabapentin (NEURONTIN) capsule 100 mg  100 mg Oral BID Himabindu Ravi, MD   100 mg at 08/12/12 0804  . HYDROcodone-acetaminophen (NORCO/VICODIN) 5-325 MG per tablet 1 tablet  1 tablet Oral BID PRN Patrick North, MD   1 tablet at 08/11/12 1952  . levothyroxine (SYNTHROID, LEVOTHROID) tablet 100 mcg  100 mcg Oral QAC breakfast Rachael Fee, MD   100 mcg at 08/12/12 [phone]  . magnesium hydroxide (MILK OF  MAGNESIA) suspension 30 mL  30 mL Oral Daily PRN Rachael Fee, MD      . nitroGLYCERIN (NITROSTAT) SL tablet 0.4 mg  0.4 mg Sublingual Q5 min PRN Rachael Fee, MD      . phenol [facility]) mouth spray 1 spray  1 spray Mouth/Throat PRN Kerry Hough, PA-C   1 spray at 08/12/12 1213  . QUEtiapine (SEROQUEL) tablet 100 mg  100 mg Oral QHS Mickeal Skinner, MD   100 mg at 08/11/12 1951  . traZODone (DESYREL) tablet 100 mg  100 mg Oral QHS PRN Himabindu Ravi, MD   100 mg at 08/11/12 2135  . triamterene-hydrochlorothiazide (MAXZIDE-25) 37.5-25 MG per tablet 1 each  1 each Oral Daily Rachael Fee, MD   1 each at 08/12/12 [phone]    Lab Results: No results found for this or any previous visit (from the past 48 hour(s)).  Physical Findings: AIMS: Facial and Oral Movements Muscles of Facial Expression: None, normal Lips and Perioral Area: None, normal Jaw: None, normal Tongue: None, normal,Extremity Movements Upper (arms, wrists, hands, fingers): None, normal Lower (legs, knees, ankles, toes): None, normal, Trunk Movements Neck, shoulders, hips: None, normal, Overall Severity Severity of abnormal movements (highest score from questions above): None, normal Incapacitation due to abnormal movements: None, normal Patient's awareness of abnormal movements (rate only patient's report): No Awareness, Dental Status Current problems with teeth and/or dentures?: No Does patient usually wear dentures?: No  CIWA:  CIWA-Ar Total: 6 COWS:  COWS Total Score: 3  Treatment Plan Summary: Daily contact with patient to assess and evaluate symptoms and progress in treatment Medication management  Plan: 1. Patient encouraged to attend groups to develop new coping skills for depression. 2. Monitor vitals signs-stable, patient may use prn medications available to manage current cold symptoms. 3. Continue to manage taper of patient off xanax and oxycodone. Patient's pain in responding to new therapy with Neurontin  100 mg TID for pain.  4. Continue Trazodone 100 mg every hs for sleep.  5. Possible discharge tomorrow.   Medical Decision Making Problem Points:  Established problem, stable/improving (1) Data Points:  Review of medication regiment & side effects (2)  I certify that inpatient services furnished can reasonably be expected to improve the patient's condition.   Fransisca Kaufmann ANN NP-C 08/12/2012, 1:48 PM

## 2012-08-12 NOTE — Progress Notes (Signed)
D:  Patient has complained of not feeling well today, cold symptoms.  Has not attended groups.  Self inventory done this morning and patient denies depressive symptoms or suicidal thoughts.   A:  Throat spray given prior to lunch.  Fluids encouraged.  Allowed patient to stay in her room when she wanted to minimize virus exposure to others. R:  Pleasant and cooperative.  Interacting well with staff and peers, but does not feel well today.  States she feels ready to go home on a mental health standpoint and is hoping for discharge tomorrow.

## 2012-08-12 NOTE — Progress Notes (Signed)
D: Patient states she had a good day.  Patient states she is ready to go home.  Patient states she had enjoyed her stay at [facility].  Patient states she feels like she was party of a family here.  Patient  Denies SI/HI and denies AVH. A: Staff to monitor Q 15 mins for safety.  Encouragement and support offered.  Trazodone administered prn for sleep.  Tylenol and chloraseptic spray administered prn for throat soreness. R: Patient remains safe on the unit.  Patient attended group tonight.  Patient calm, cooperative and taking administered medications.  Patient visible on the the unit and interacting with peers. Patient states pain decreased after prn pain medicine.

## 2012-08-12 NOTE — Progress Notes (Signed)
[facility] Aftercare Group  08/12/2012 12:26 PM  Type of Therapy:  Patient did not attend group.   Wynn Banker 08/12/2012, 12:26 PM

## 2012-08-13 DIAGNOSIS — F329 Major depressive disorder, single episode, unspecified: Secondary | ICD-10-CM

## 2012-08-13 DIAGNOSIS — F411 Generalized anxiety disorder: Secondary | ICD-10-CM

## 2012-08-13 MED ORDER — ALPRAZOLAM 0.5 MG PO TABS
0.5000 mg | ORAL_TABLET | Freq: Two times a day (BID) | ORAL | Status: DC | PRN
  Administered 2012-08-13: 0.5 mg via ORAL
  Filled 2012-08-13: qty 1

## 2012-08-13 MED ORDER — GABAPENTIN 100 MG PO CAPS
100.0000 mg | ORAL_CAPSULE | Freq: Three times a day (TID) | ORAL | Status: DC
  Administered 2012-08-13 – 2012-08-14 (×4): 100 mg via ORAL
  Filled 2012-08-13 (×6): qty 1

## 2012-08-13 NOTE — Tx Team (Signed)
.  Interdisciplinary Treatment Plan Update   Date Reviewed:  08/13/2012  Time Reviewed:  10:08 AM  Progress in Treatment:   Attending groups: Yes Participating in groups: Yes Taking medication as prescribed: Yes  Tolerating medication: Yes Family/Significant other contact made: Contact made with husband Patient understands diagnosis: Yes  Discussing patient identified problems/goals with staff: Yes Medical problems stabilized or resolved: Yes Denies suicidal/homicidal ideation: Yes Patient has not harmed self or others: Yes  For review of initial/current patient goals, please see plan of care.  Estimated Length of Stay:  1 day  Reasons for Continued Hospitalization:   Medication stabilization  New Problems/Goals identified:    Discharge Plan or Barriers:   Home with outpatient follow up at [facility]  Additional Comments:  Patient is denying SI/HI.  She rates depression and anxiety at two.  MD to adjust Neurontin to 100 mg 3 x daily and Hydrocodone once daily as need.    Attendees:  Patient:  08/13/2012 10:08 AM   Signature: Patrick North, MD 08/13/2012 10:08 AM  Signature:  Harold Barban, RN 08/13/2012 10:08 AM  Signature: Fransisca Kaufmann, [facility] 08/13/2012 10:08 AM  Signature: Dorien Chihuahua, BSW 08/13/2012 10:08 AM  Signature:  Edmonia Caprio, Intern  08/13/2012 10:08 AM  Signature:  Juline Patch, LCSW 08/13/2012 10:08 AM  Signature:  08/13/2012 10:08 AM  Signature: 08/13/2012 10:08 AM  Signature:    Signature:    Signature:    Signature:      Scribe for Treatment Team:   Juline Patch,  08/13/2012 10:08 AM

## 2012-08-13 NOTE — Progress Notes (Signed)
Recreation Therapy Notes  Date: 03.12.2014 Time: 3:00pm Location: Art Room      Group Topic/Focus: Personal Development & Goal Setting  Participation Level: Active  Participation Quality: Sharing  Affect: Euthymic  Cognitive: Appropriate  Additional Comments: Patient filled out personal Coat of Arms. Patient identified the following components of Coat of Arms: My best quality, Something I am good at, Something I value, An obstacle I have overcome, A turning point in my life, Something I want to accomplish in the next year. Patient chose to share "Something I Value" and "Something I want to accomplish in the next year." Patient stated "family" and "Live life." Patient stated she has never truly lived life for herself and now she feels prepared to do so. Patient spoke about her husband's support and how much it means to her. Patient became tearful while talking about her husband. Patient stated she has learned communication techniques she feels with help her post d/c. Patient stated she feels more prepared to set proper boundaries with her daughter post d/c. Patient stated she was going to post her Coat of Arms in her house to remind her of her strengths and to keep her focused on her goal.    Jearl Klinefelter, LRT/CTRS   Jearl Klinefelter 08/13/2012 3:53 PM

## 2012-08-13 NOTE — Progress Notes (Signed)
Adult Psychoeducational Group Note  Date:  08/13/2012 Time:  10:31 AM  Group Topic/Focus:  Therapeutic Activity- Question Newman Pies  Participation Level:  Active  Participation Quality:  Appropriate, Attentive and Sharing  Affect:  Appropriate  Cognitive:  Alert and Appropriate  Insight: Appropriate  Engagement in Group:  Engaged  Modes of Intervention:  Discussion  Additional Comments:  Pt was appropriate and attentive while attending group. Pt was willing to answer a question from the question ball.   [name] 08/13/2012, 10:31 AM

## 2012-08-13 NOTE — Progress Notes (Signed)
[facility] MD Progress Note  08/13/2012 11:16 AM Christine Robles  MRN:  [id number] Subjective:  Patient requested to go home during treatment team but she wanted to stop taking her narcotic pain medications so she will stay one more day to taper these off.  She stated her husband is being very supportive and was hoping she could come home today.  Kimarie denies suicidal ideations today and smiles frequently, involved in her treatment. Diagnosis:   Axis I: Anxiety Disorder NOS, Major Depression, single episode and Post Traumatic Stress Disorder Axis II: Deferred Axis III:  Past Medical History  Diagnosis Date  . Hypertension   . Anxiety   . Depression   . Chronic back pain   . Arthritis   . GERD (gastroesophageal reflux disease)   . Thyroid disease     hypothyroidism  . Ventral hernia   . Hypothyroidism    Axis IV: other psychosocial or environmental problems, problems related to social environment and problems with primary support group Axis V: 41-50 serious symptoms  ADL's:  Intact  Sleep: Fair  Appetite:  Fair  Suicidal Ideation:  Denies Homicidal Ideation:  Denies  Psychiatric Specialty Exam: Review of Systems  Constitutional: Negative.   HENT: Negative.   Eyes: Negative.   Respiratory: Negative.   Cardiovascular: Negative.   Gastrointestinal: Negative.   Genitourinary: Negative.   Musculoskeletal: Positive for back pain.  Skin: Negative.   Neurological: Negative.   Endo/Heme/Allergies: Negative.   Psychiatric/Behavioral: Positive for depression. The patient is nervous/anxious.     Blood pressure 141/86, pulse 91, temperature 96.9 F (36.1 C), temperature source Oral, resp. rate 18, height 5\' 7"  (1.702 m), weight 119.296 kg (263 lb), last menstrual period 08/08/1994, SpO2 99.00%.Body mass index is 41.18 kg/(m^2).  General Appearance: Casual  Eye Contact::  Fair  Speech:  Normal Rate  Volume:  Normal  Mood:  Anxious and Depressed  Affect:  Congruent  Thought Process:   Coherent  Orientation:  Full (Time, Place, and Person)  Thought Content:  WDL  Suicidal Thoughts:  No  Homicidal Thoughts:  No  Memory:  Immediate;   Fair Recent;   Fair Remote;   Fair  Judgement:  Fair  Insight:  Fair  Psychomotor Activity:  Decreased  Concentration:  Fair  Recall:  Fair  Akathisia:  No  Handed:  Right  AIMS (if indicated):     Assets:  Communication Skills Desire for Improvement Resilience Social Support  Sleep:  Number of Hours: 6   Current Medications: Current Facility-Administered Medications  Medication Dose Route Frequency Provider Last Rate Last Dose  . acetaminophen (TYLENOL) tablet 650 mg  650 mg Oral Q6H PRN Rachael Fee, MD   650 mg at 08/13/12 0315  . ALPRAZolam (XANAX) tablet 0.5 mg  0.5 mg Oral BID PRN Himabindu Ravi, MD      . alum & mag hydroxide-simeth (MAALOX/MYLANTA) 200-200-20 MG/5ML suspension 30 mL  30 mL Oral Q4H PRN Rachael Fee, MD      . benztropine (COGENTIN) tablet 0.5 mg  0.5 mg Oral BID PRN Mickeal Skinner, MD      . DULoxetine (CYMBALTA) DR capsule 60 mg  60 mg Oral Daily Rachael Fee, MD   60 mg at 08/13/12 0829  . gabapentin (NEURONTIN) capsule 100 mg  100 mg Oral TID Himabindu Ravi, MD      . HYDROcodone-acetaminophen (NORCO/VICODIN) 5-325 MG per tablet 1 tablet  1 tablet Oral BID PRN Himabindu Ravi, MD   1 tablet at  08/12/12 1648  . levothyroxine (SYNTHROID, LEVOTHROID) tablet 100 mcg  100 mcg Oral QAC breakfast Rachael Fee, MD   100 mcg at 08/13/12 [phone]  . magnesium hydroxide (MILK OF MAGNESIA) suspension 30 mL  30 mL Oral Daily PRN Rachael Fee, MD      . nitroGLYCERIN (NITROSTAT) SL tablet 0.4 mg  0.4 mg Sublingual Q5 min PRN Rachael Fee, MD      . phenol [facility]) mouth spray 1 spray  1 spray Mouth/Throat PRN Kerry Hough, PA-C   1 spray at 08/13/12 0916  . QUEtiapine (SEROQUEL) tablet 100 mg  100 mg Oral QHS Mickeal Skinner, MD   100 mg at 08/12/12 2011  . traZODone (DESYREL) tablet 100 mg  100 mg Oral QHS  PRN Himabindu Ravi, MD   100 mg at 08/12/12 2109  . triamterene-hydrochlorothiazide (MAXZIDE-25) 37.5-25 MG per tablet 1 each  1 each Oral Daily Rachael Fee, MD   1 each at 08/13/12 [phone]    Lab Results: No results found for this or any previous visit (from the past 48 hour(s)).  Physical Findings: AIMS: Facial and Oral Movements Muscles of Facial Expression: None, normal Lips and Perioral Area: None, normal Jaw: None, normal Tongue: None, normal,Extremity Movements Upper (arms, wrists, hands, fingers): None, normal Lower (legs, knees, ankles, toes): None, normal, Trunk Movements Neck, shoulders, hips: None, normal, Overall Severity Severity of abnormal movements (highest score from questions above): None, normal Incapacitation due to abnormal movements: None, normal Patient's awareness of abnormal movements (rate only patient's report): No Awareness, Dental Status Current problems with teeth and/or dentures?: No Does patient usually wear dentures?: No  CIWA:  CIWA-Ar Total: 6 COWS:  COWS Total Score: 3  Treatment Plan Summary: Daily contact with patient to assess and evaluate symptoms and progress in treatment Medication management  Plan:  Review of chart, vital signs, medications, and notes. 1-Individual and group therapy attending 2-Medication management for depression and anxiety:  Medications reviewed with the patient and MD tapered her narcotic pain medications, increased her gabapentin for pain 3-Coping skills for depression, anxiety, and back pain; encouraging positive thinking 4-Continue crisis stabilization and management 5-Address health issues--monitoring vital signs, stable 6-Treatment plan in progress to prevent relapse of depression and anxiety  Medical Decision Making Problem Points:  Established problem, stable/improving (1) and Review of psycho-social stressors (1) Data Points:  Review of new medications or change in dosage (2)  I certify that inpatient  services furnished can reasonably be expected to improve the patient's condition.   Nanine Means, PMH-NP 08/13/2012, 11:16 AM

## 2012-08-13 NOTE — Progress Notes (Signed)
Reviewed

## 2012-08-13 NOTE — Progress Notes (Signed)
D: Patient appropriate and cooperative with staff and peers. Patient's affect/mood is flat and anxious. Patient complained of throat irritation. She reported on the self inventory sheet that her sleep is fair, appetite is good, energy level is low and ability to pay attention is good. Patient rated depression "1" and feelings of hopelessness "0".  A: Support and encouragement provided to patient. Scheduled medications administered per MD orders. Maintain Q15 minute checks for safety.  R: Patient receptive. Denies SI/HI/AVH. Patient remains safe.

## 2012-08-13 NOTE — Progress Notes (Signed)
[facility] LCSW Group Therapy  Emotional Regulation 1:15 - 2:30  08/13/2012 3:09 PM  Type of Therapy:  Group Therapy  Participation Level:  Active  Participation Quality:  Appropriate  Affect:  Appropriate  Cognitive:  Appropriate  Insight:  Engaged  Engagement in Therapy:  Engaged  Modes of Intervention:  Discussion, Education, Exploration, Problem-solving, Rapport Building and Support  Summary of Progress/Problems:  Patient shared the emotion she has to learn to control is guilt.  She shared her daughter is in her 1's and has not worked in years but always makes her feel guilty if she can not give her money or pay her bills.  Patient stated she is 57 years old and it is time for her to enjoy her life, not to take care of adults who can care for themselves.  Wynn Banker 08/13/2012, 3:09 PM

## 2012-08-13 NOTE — Progress Notes (Signed)
[facility] LCSW Aftercare Discharge Planning Group Note  08/13/2012 3:07 PM  Participation Quality:  Appropriate and Attentive  Affect:  Appropriate  Cognitive:  Appropriate  Insight:  Engaged  Engagement in Group:  Engaged  Modes of Intervention:  Education, Exploration, Dentist, Rapport Building and Support  Summary of Progress/Problems:  Patient reports being much better today and ready to dsicharge home.  She denies SI/HI and rates symptoms at two. Patient shared she had medication concerns but was advised to talk with MD regarding any medication concerns.  Wynn Banker 08/13/2012, 3:07 PM

## 2012-08-13 NOTE — Progress Notes (Signed)
Adult Psychoeducational Group Note  Date:  08/13/2012 Time:  11:58 AM  Group Topic/Focus:  Coping With Mental Health Crisis:   The purpose of this group is to help patients identify strategies for coping with mental health crisis.  Group discusses possible causes of crisis and ways to manage them effectively.  Participation Level:  Active  Participation Quality:  Appropriate  Affect:  Depressed  Cognitive:  Oriented  Insight: Good  Engagement in Group:  Engaged  Modes of Intervention:  Discussion, Education and Support  Additional Comments:  PT said her goal is to complete the personal development workbook and to stay one more day. Pt is ok with staying longer. Pt also stated that the group meetings have been helpful.  BELL, CLAIRE T 08/13/2012, 11:58 AM

## 2012-08-14 MED ORDER — TRAZODONE HCL 100 MG PO TABS: 100.0000 mg | ORAL_TABLET | Freq: Every evening | ORAL | Status: DC | PRN

## 2012-08-14 MED ORDER — OMEPRAZOLE 20 MG PO CPDR: 40.0000 mg | DELAYED_RELEASE_CAPSULE | Freq: Every day | ORAL | Status: DC

## 2012-08-14 MED ORDER — LEVOTHYROXINE SODIUM 100 MCG PO TABS: 100.0000 ug | ORAL_TABLET | Freq: Every day | ORAL | Status: DC

## 2012-08-14 MED ORDER — DULOXETINE HCL 60 MG PO CPEP: 60.0000 mg | ORAL_CAPSULE | Freq: Every day | ORAL | Status: DC

## 2012-08-14 MED ORDER — GABAPENTIN 100 MG PO CAPS: 100.0000 mg | ORAL_CAPSULE | Freq: Three times a day (TID) | ORAL | Status: DC

## 2012-08-14 MED ORDER — TRIAMTERENE-HCTZ 37.5-25 MG PO TABS: 1.0000 | ORAL_TABLET | Freq: Every day | ORAL | Status: AC

## 2012-08-14 MED ORDER — BENZTROPINE MESYLATE 0.5 MG PO TABS: 0.5000 mg | ORAL_TABLET | Freq: Two times a day (BID) | ORAL | Status: DC | PRN

## 2012-08-14 MED ORDER — DULOXETINE HCL 60 MG PO CPEP: 60.0000 mg | ORAL_CAPSULE | Freq: Two times a day (BID) | ORAL | Status: DC

## 2012-08-14 MED ORDER — MULTIVITAMINS PO CAPS: 1.0000 | ORAL_CAPSULE | Freq: Every day | ORAL | Status: DC

## 2012-08-14 MED ORDER — QUETIAPINE FUMARATE 100 MG PO TABS: 100.0000 mg | ORAL_TABLET | Freq: Every day | ORAL | Status: DC
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
## 2012-08-14 NOTE — [facility] Suicide Risk Assessment (Signed)
Suicide Risk Assessment  Discharge Assessment     Demographic Factors:  Female, caucasian, married  Mental Status Per Nursing Assessment::   On Admission:  Suicidal ideation indicated by patient;Self-harm thoughts;Belief that plan would result in death  Current Mental Status by Physician: Patient alert and oriented to 4. Denies AH/Vh/SI/HI.  Loss Factors: Financial problems/change in socioeconomic status  Historical Factors: Impulsivity  Risk Reduction Factors:   Sense of responsibility to family, Living with another person, especially a relative, Positive social support and Positive coping skills or problem solving skills  Continued Clinical Symptoms:  Depression:   Recent sense of peace/wellbeing  Cognitive Features That Contribute To Risk:  Cognitively intact  Suicide Risk:  Minimal: No identifiable suicidal ideation.  Patients presenting with no risk factors but with morbid ruminations; may be classified as minimal risk based on the severity of the depressive symptoms  Discharge Diagnoses:   AXIS I:  Major Depression, Recurrent severe AXIS II:  Deferred AXIS III:   Past Medical History  Diagnosis Date  . Hypertension   . Anxiety   . Depression   . Chronic back pain   . Arthritis   . GERD (gastroesophageal reflux disease)   . Thyroid disease     hypothyroidism  . Ventral hernia   . Hypothyroidism    AXIS IV:  other psychosocial or environmental problems AXIS V:  61-70 mild symptoms  Plan Of Care/Follow-up recommendations:  Activity:  as tolerated Diet:  regular  Is patient on multiple antipsychotic therapies at discharge:  No   Has Patient had three or more failed trials of antipsychotic monotherapy by history:  No  Recommended Plan for Multiple Antipsychotic Therapies: NA  RAVI, HIMABINDU 08/14/2012, 9:37 AM

## 2012-08-14 NOTE — Progress Notes (Signed)
[facility] Adult Case Management Discharge Plan :  Will you be returning to the same living situation after discharge: Yes,  Patient is returning to her home. At discharge, do you have transportation home?:Yes,  Patient to have family transport her home. Do you have the ability to pay for your medications:Yes,  Patient can afford co-pay for medications.  Release of information consent forms completed and in the chart;  Patient's signature needed at discharge.  Patient to Follow up at: Follow-up Information   Follow up with [facility] On 08/15/2012. (Plese go to [facility]'s walk in clinic on Friday, August 15, 2012 at 8:00 AM.  Please be prepared to wait as patient are seen on first come first served basis for walk in clinic.  Please let them know you would like to be scheduled with a therapist.)    Contact information:   [address] [place], [place]   [address]  [phone]      Patient denies SI/HI:   Yes,  Patient is not endorsing SI/HI or thoughts of self harm.    Safety Planning and Suicide Prevention discussed:  Yes,  Reviewed during aftercare groups.  Wynn Banker 08/14/2012, 11:35 AM

## 2012-08-14 NOTE — Progress Notes (Signed)
[facility] LCSW Aftercare Discharge Planning Group Note  08/14/2012 11:33 AM  Participation Quality:  Appropriate and Attentive  Affect:  Appropriate  Cognitive:  Appropriate  Insight:  Engaged  Engagement in Group:  Engaged  Modes of Intervention:  Education, Exploration, Dentist, Rapport Building and Support  Summary of Progress/Problems:  Patient reports doing great and being ready to discharge home today.  She denies SI/HI and rates depression at one and anxiety at three.  Patient shared she does not want to return to work until Wednesday of next week.  Letter to be provided.  Daily workbook provided.   Wynn Banker 08/14/2012, 11:33 AM

## 2012-08-14 NOTE — Progress Notes (Signed)
Patient denies SI/HI, denies A/V hallucinations. Patient verbalizes understanding of discharge instructions, follow up care and prescriptions. Patient given all belongings from [facility] locker. Patient escorted out by staff, transported by family. 

## 2012-08-14 NOTE — Progress Notes (Signed)
Psychoeducational Group Note  Date:  08/14/2012 Time:  1000  Group Topic/Focus:  Therapeutic Activity  Participation Level: Did Not Attend  Participation Quality:  Not Applicable  Affect:  Not Applicable  Cognitive:  Not Applicable  Insight:  Not Applicable  Engagement in Group: Not Applicable  Additional Comments:  Patient did not attend group. Patient remained in bed.  Karleen Hampshire Brittini 08/14/2012, 11:03 AM

## 2012-08-14 NOTE — Progress Notes (Signed)
Adult Psychoeducational Group Note  Date:  08/14/2012 Time:1100   Group Topic/Focus:  Building Self Esteem:   The Focus of this group is helping patients become aware of the effects of self-esteem on their lives, the things they and others do that enhance or undermine their self-esteem, seeing the relationship between their level of self-esteem and the choices they make and learning ways to enhance self-esteem.  Participation Level:  Active  Participation Quality:  Appropriate and Attentive  Affect:  Appropriate  Cognitive:  Alert and Appropriate  Insight: Appropriate  Engagement in Group:  Engaged  Modes of Intervention:  Discussion and Education  Additional Comments:    Noah Charon 08/14/2012, 12:32 PM

## 2012-08-19 NOTE — Progress Notes (Signed)
Patient Discharge Instructions:  After Visit Summary (AVS):   Faxed to:  08/19/12 Discharge Summary Note:   Faxed to:  08/19/12 Psychiatric Admission Assessment Note:   Faxed to:  08/19/12 Suicide Risk Assessment - Discharge Assessment:   Faxed to:  08/19/12 Faxed/Sent to the Next Level Care provider:  08/19/12 Faxed to [facility] @ [phone]  Jerelene Redden, 08/19/2012, 3:55 PM

## 2012-09-30 ENCOUNTER — Other Ambulatory Visit: Payer: Self-pay | Admitting: Sports Medicine

## 2012-09-30 DIAGNOSIS — M545 Low back pain, unspecified: Secondary | ICD-10-CM

## 2012-10-01 ENCOUNTER — Ambulatory Visit
Admission: RE | Admit: 2012-10-01 | Discharge: 2012-10-01 | Disposition: A | Discharge status: Home / Self Care | Payer: No Typology Code available for payment source | Source: Ambulatory Visit | Attending: Sports Medicine | Admitting: Sports Medicine

## 2012-10-01 DIAGNOSIS — M545 Low back pain, unspecified: Secondary | ICD-10-CM

## 2012-10-06 ENCOUNTER — Other Ambulatory Visit: Payer: Self-pay

## 2012-12-26 ENCOUNTER — Other Ambulatory Visit: Payer: Self-pay | Admitting: Neurosurgery

## 2012-12-26 DIAGNOSIS — M47812 Spondylosis without myelopathy or radiculopathy, cervical region: Secondary | ICD-10-CM

## 2013-01-01 ENCOUNTER — Ambulatory Visit
Admission: RE | Admit: 2013-01-01 | Discharge: 2013-01-01 | Disposition: A | Discharge status: Home / Self Care | Payer: No Typology Code available for payment source | Source: Ambulatory Visit | Attending: Neurosurgery | Admitting: Neurosurgery

## 2013-01-01 DIAGNOSIS — M47812 Spondylosis without myelopathy or radiculopathy, cervical region: Secondary | ICD-10-CM

## 2013-01-06 ENCOUNTER — Telehealth: Payer: Self-pay | Admitting: Internal Medicine

## 2013-01-06 MED ORDER — METOCLOPRAMIDE HCL 5 MG PO TABS
ORAL_TABLET | ORAL | Status: DC
Start: 2013-01-06 — End: 2014-07-06

## 2013-01-06 NOTE — Telephone Encounter (Signed)
This a note from 03/09/2009 and per Dr Juanda Chance: Problem # 1: NAUSEA (ICD-787.02)  Patient has persistent nausea most likely related to gastroparesis which was initially documented in 2000 on a gastric emptying scan. She is on Vicodin 3 times a day which is the contributing to the delayed gastric emptying. I have asked her to reduce it to once a day and instead will give her tramadol 50 mg up to 3/ day. We have instructed her on a gastroparesis diet and have also started her on Reglan 10 mg before each meal.   Pt states she never took Reglan on a regular basis. She has lost her job and can't afford to come in, but wonders if Dr Juanda Chance will order reglan for her. She is trying to get disability and needs to know if she was ever dx with Gastroparesis. Found the note above in the chart and also a note from 08/24/10 where pt was requesting a refill of Reglan. She states she has a lot of problems with her stomach and sometimes she remembers Dr Juanda Chance telling her to each saltines and drink clear liquids. Dr Juanda Chance, OK to order Reglan for pt? Thanks,

## 2013-01-06 NOTE — Telephone Encounter (Signed)
She had a normal GES in 2005. But you can refill Reglan because it helps nausea. reglan 5 mg, #60 1 po ac prn bloating .

## 2013-01-06 NOTE — Telephone Encounter (Signed)
Informed pt Dr Juanda Chance ordered her med.

## 2013-04-09 ENCOUNTER — Other Ambulatory Visit: Payer: Self-pay

## 2013-04-24 ENCOUNTER — Other Ambulatory Visit: Payer: Self-pay

## 2013-04-24 DIAGNOSIS — Z1231 Encounter for screening mammogram for malignant neoplasm of breast: Secondary | ICD-10-CM

## 2013-05-21 ENCOUNTER — Ambulatory Visit: Payer: Self-pay

## 2013-06-18 ENCOUNTER — Other Ambulatory Visit: Payer: Self-pay | Admitting: Internal Medicine

## 2013-06-18 ENCOUNTER — Ambulatory Visit
Admission: RE | Admit: 2013-06-18 | Discharge: 2013-06-18 | Disposition: A | Discharge status: Home / Self Care | Payer: BC Managed Care – PPO | Source: Ambulatory Visit

## 2013-06-18 DIAGNOSIS — Z1231 Encounter for screening mammogram for malignant neoplasm of breast: Secondary | ICD-10-CM

## 2013-06-18 DIAGNOSIS — N63 Unspecified lump in unspecified breast: Secondary | ICD-10-CM

## 2013-06-25 ENCOUNTER — Ambulatory Visit
Admission: RE | Admit: 2013-06-25 | Discharge: 2013-06-25 | Disposition: A | Discharge status: Home / Self Care | Payer: BC Managed Care – PPO | Source: Ambulatory Visit | Attending: Internal Medicine | Admitting: Internal Medicine

## 2013-06-25 DIAGNOSIS — N63 Unspecified lump in unspecified breast: Secondary | ICD-10-CM

## 2013-08-04 ENCOUNTER — Other Ambulatory Visit: Payer: Self-pay | Admitting: Nurse Practitioner

## 2013-08-04 ENCOUNTER — Ambulatory Visit
Admission: RE | Admit: 2013-08-04 | Discharge: 2013-08-04 | Disposition: A | Discharge status: Home / Self Care | Payer: BC Managed Care – PPO | Source: Ambulatory Visit | Attending: Nurse Practitioner | Admitting: Nurse Practitioner

## 2013-08-04 DIAGNOSIS — R0602 Shortness of breath: Secondary | ICD-10-CM

## 2013-09-07 ENCOUNTER — Other Ambulatory Visit (HOSPITAL_COMMUNITY): Payer: Self-pay

## 2013-09-07 DIAGNOSIS — R0600 Dyspnea, unspecified: Secondary | ICD-10-CM

## 2013-09-07 LAB — PULMONARY FUNCTION TEST
DL/VA % pred: 102 %
DL/VA: 5.15 ml/min/mmHg/L
DLCO UNC % PRED: 85 %
DLCO UNC: 23.11 ml/min/mmHg
DLCO cor % pred: 85 %
DLCO cor: 23.11 ml/min/mmHg
FEF 25-75 POST: 2.06 L/s
FEF 25-75 Pre: 1.97 L/sec
FEF2575-%Change-Post: 4 %
FEF2575-%PRED-PRE: 78 %
FEF2575-%Pred-Post: 82 %
FEV1-%CHANGE-POST: 1 %
FEV1-%PRED-PRE: 80 %
FEV1-%Pred-Post: 81 %
FEV1-Post: 2.26 L
FEV1-Pre: 2.22 L
FEV1FVC-%Change-Post: 4 %
FEV1FVC-%Pred-Pre: 98 %
FEV6-%CHANGE-POST: -2 %
FEV6-%PRED-POST: 81 %
FEV6-%PRED-PRE: 83 %
FEV6-PRE: 2.88 L
FEV6-Post: 2.81 L
FEV6FVC-%PRED-POST: 103 %
FEV6FVC-%PRED-PRE: 103 %
FVC-%Change-Post: -2 %
FVC-%PRED-POST: 78 %
FVC-%PRED-PRE: 80 %
FVC-POST: 2.81 L
FVC-PRE: 2.88 L
POST FEV1/FVC RATIO: 80 %
POST FEV6/FVC RATIO: 100 %
Pre FEV1/FVC ratio: 77 %
Pre FEV6/FVC Ratio: 100 %
RV % PRED: 118 %
RV: 2.46 L
TLC % PRED: 98 %
TLC: 5.28 L

## 2013-09-11 ENCOUNTER — Ambulatory Visit (HOSPITAL_COMMUNITY)
Admission: RE | Admit: 2013-09-11 | Discharge: 2013-09-11 | Disposition: A | Discharge status: Home / Self Care | Payer: BC Managed Care – PPO | Source: Ambulatory Visit | Attending: Internal Medicine | Admitting: Internal Medicine

## 2013-09-11 ENCOUNTER — Telehealth: Payer: Self-pay | Admitting: Internal Medicine

## 2013-09-11 DIAGNOSIS — Z87891 Personal history of nicotine dependence: Secondary | ICD-10-CM | POA: Insufficient documentation

## 2013-09-11 DIAGNOSIS — R0609 Other forms of dyspnea: Secondary | ICD-10-CM | POA: Insufficient documentation

## 2013-09-11 DIAGNOSIS — R0989 Other specified symptoms and signs involving the circulatory and respiratory systems: Principal | ICD-10-CM | POA: Insufficient documentation

## 2013-09-11 DIAGNOSIS — Z79899 Other long term (current) drug therapy: Secondary | ICD-10-CM | POA: Insufficient documentation

## 2013-09-11 MED ORDER — ALBUTEROL SULFATE (2.5 MG/3ML) 0.083% IN NEBU
2.5000 mg | INHALATION_SOLUTION | Freq: Once | RESPIRATORY_TRACT | Status: AC
  Administered 2013-09-11: 2.5 mg via RESPIRATORY_TRACT

## 2013-09-11 NOTE — Telephone Encounter (Signed)
Left message for pt to call back  °

## 2013-09-11 NOTE — Telephone Encounter (Signed)
I have called in Phenergan 12.5 mg. #15, 1 po q6 hrs prn, no refill

## 2013-09-11 NOTE — Telephone Encounter (Signed)
Christine Robles states that Dr. Olevia Perches gave her something in the past for nausea that worked really well. States she has just been diagnosed with RA and is taking Mtx and Embrel and is having problems with nausea. Requesting some medication for nausea. Christine Robles does not remember the name and I reviewed the chart and did not see anything for nausea. Please advise.

## 2013-09-14 MED ORDER — PROMETHAZINE HCL 12.5 MG PO TABS: 12.5000 mg | ORAL_TABLET | Freq: Four times a day (QID) | ORAL | Status: DC | PRN

## 2013-09-14 NOTE — Telephone Encounter (Signed)
Rx sent 

## 2013-10-20 ENCOUNTER — Other Ambulatory Visit: Payer: Self-pay | Admitting: Neurosurgery

## 2013-10-20 ENCOUNTER — Ambulatory Visit
Admission: RE | Admit: 2013-10-20 | Discharge: 2013-10-20 | Disposition: A | Discharge status: Home / Self Care | Payer: BC Managed Care – PPO | Source: Ambulatory Visit | Attending: Neurosurgery | Admitting: Neurosurgery

## 2013-10-20 DIAGNOSIS — M47812 Spondylosis without myelopathy or radiculopathy, cervical region: Secondary | ICD-10-CM

## 2013-11-16 ENCOUNTER — Other Ambulatory Visit: Payer: Self-pay | Admitting: Internal Medicine

## 2013-11-16 DIAGNOSIS — N63 Unspecified lump in unspecified breast: Secondary | ICD-10-CM

## 2013-12-03 ENCOUNTER — Encounter: Payer: Self-pay | Admitting: Interventional Cardiology

## 2013-12-23 ENCOUNTER — Other Ambulatory Visit: Payer: Self-pay

## 2014-03-11 ENCOUNTER — Encounter (HOSPITAL_COMMUNITY): Payer: Self-pay | Admitting: Emergency Medicine

## 2014-03-11 ENCOUNTER — Emergency Department (HOSPITAL_COMMUNITY)
Admission: EM | Admit: 2014-03-11 | Discharge: 2014-03-11 | Disposition: A | Discharge status: Home / Self Care | Payer: BC Managed Care – PPO | Attending: Emergency Medicine | Admitting: Emergency Medicine

## 2014-03-11 DIAGNOSIS — F329 Major depressive disorder, single episode, unspecified: Secondary | ICD-10-CM | POA: Insufficient documentation

## 2014-03-11 DIAGNOSIS — IMO0001 Reserved for inherently not codable concepts without codable children: Secondary | ICD-10-CM

## 2014-03-11 DIAGNOSIS — Z87891 Personal history of nicotine dependence: Secondary | ICD-10-CM | POA: Insufficient documentation

## 2014-03-11 DIAGNOSIS — M545 Low back pain: Secondary | ICD-10-CM | POA: Insufficient documentation

## 2014-03-11 DIAGNOSIS — E039 Hypothyroidism, unspecified: Secondary | ICD-10-CM | POA: Diagnosis not present

## 2014-03-11 DIAGNOSIS — R200 Anesthesia of skin: Secondary | ICD-10-CM | POA: Insufficient documentation

## 2014-03-11 DIAGNOSIS — M549 Dorsalgia, unspecified: Secondary | ICD-10-CM

## 2014-03-11 DIAGNOSIS — F419 Anxiety disorder, unspecified: Secondary | ICD-10-CM | POA: Insufficient documentation

## 2014-03-11 DIAGNOSIS — I10 Essential (primary) hypertension: Secondary | ICD-10-CM | POA: Insufficient documentation

## 2014-03-11 DIAGNOSIS — Z79899 Other long term (current) drug therapy: Secondary | ICD-10-CM | POA: Insufficient documentation

## 2014-03-11 DIAGNOSIS — G8929 Other chronic pain: Secondary | ICD-10-CM

## 2014-03-11 DIAGNOSIS — Z9889 Other specified postprocedural states: Secondary | ICD-10-CM | POA: Insufficient documentation

## 2014-03-11 DIAGNOSIS — K219 Gastro-esophageal reflux disease without esophagitis: Secondary | ICD-10-CM | POA: Diagnosis not present

## 2014-03-11 DIAGNOSIS — E669 Obesity, unspecified: Secondary | ICD-10-CM | POA: Diagnosis not present

## 2014-03-11 DIAGNOSIS — R209 Unspecified disturbances of skin sensation: Secondary | ICD-10-CM

## 2014-03-11 LAB — CBC
HCT: 38.5 % (ref 36.0–46.0)
HEMOGLOBIN: 12.9 g/dL (ref 12.0–15.0)
MCH: 30.1 pg (ref 26.0–34.0)
MCHC: 33.5 g/dL (ref 30.0–36.0)
MCV: 89.7 fL (ref 78.0–100.0)
Platelets: 259 10*3/uL (ref 150–400)
RBC: 4.29 MIL/uL (ref 3.87–5.11)
RDW: 15.4 % (ref 11.5–15.5)
WBC: 4.2 10*3/uL (ref 4.0–10.5)

## 2014-03-11 LAB — BASIC METABOLIC PANEL
Anion gap: 12 (ref 5–15)
BUN: 11 mg/dL (ref 6–23)
CALCIUM: 9.1 mg/dL (ref 8.4–10.5)
CO2: 28 meq/L (ref 19–32)
CREATININE: 0.79 mg/dL (ref 0.50–1.10)
Chloride: 100 mEq/L (ref 96–112)
GFR calc Af Amer: >90 mL/min (ref >90)
GFR, EST NON AFRICAN AMERICAN: 89 mL/min — AB (ref >90)
GLUCOSE: 101 mg/dL — AB (ref 70–99)
Potassium: 3.8 mEq/L (ref 3.7–5.3)
Sodium: 140 mEq/L (ref 137–147)

## 2014-03-11 MED ORDER — MELOXICAM 7.5 MG PO TABS: 7.5000 mg | ORAL_TABLET | Freq: Every day | ORAL | Status: DC

## 2014-03-11 NOTE — ED Notes (Signed)
Bed: WA11 Expected date:  Expected time:  Means of arrival:  Comments: ems 

## 2014-03-11 NOTE — ED Provider Notes (Signed)
CSN: [id number]     Arrival date & time 03/11/14  0913 History   First MD Initiated Contact with Patient 03/11/14 [phone]     Chief Complaint  Patient presents with  . Arm Problem    bilateral arm numbness     (Consider location/radiation/quality/duration/timing/severity/associated sxs/prior Treatment) HPI Comments: Christine Robles is a 58 y.o. female with a PMHx of HTN, anxiety, depression, chronic back pain s/p lumbar surgery, arthritis, GERD, hypothyroidism, and PSHx of cholecystectomy, L knee surgery, and ventral hernia repair, who presents to the ED with complaints of multiple complaints including several years of chronic lumbar back pain being treated by pain clinic with multiple pain medications which make her feel lightheaded, nauseated, sleepy, and make her "walk like I'm drunk". States this only occurs when she takes her pain meds. Has tried decreasing her doses with some relief of those symptoms. She also complains of b/l arm paresthesias and achy pain which occurred this morning but resolved PTA, states this has occurred in the past and always resolves spontaneously. Denies fevers, chills, CP, SOB, cough, URI symptoms, neck pain, neck stiffness, syncope, lightheadedness currently, abd pain/distension, n/v/d/c, cauda equina symptoms, dysuria, hematuria, vaginal bleeding/discharge, hx of cancer, LE swelling, weakness, numbness, incontinence, tremors, seizure-like activity, or skin changes. Reports that occasionally she has leg paresthesias but this is not ongoing at this time. All of her symptoms occur randomly and resolve spontaneously. She's had migraines previously with fentanyl patches, but headaches resolved when she was taken off those. Continues to see pain clinic.   Patient is a 58 y.o. female presenting with back pain. The history is provided by the patient. No language interpreter was used.  Back Pain Location:  Lumbar spine Quality:  Aching Radiates to:  Does not radiate Pain  severity:  Mild Pain is:  Same all the time Onset quality:  Gradual Duration: years. Timing:  Constant Progression:  Waxing and waning Chronicity:  Chronic Context: not lifting heavy objects, not recent injury and not twisting   Relieved by:  Lying down and narcotics Worsened by:  Nothing tried Ineffective treatments:  None tried Associated symptoms: leg pain (intermittently) and paresthesias (intermittently in all extremities)   Associated symptoms: no abdominal pain, no abdominal swelling, no bladder incontinence, no bowel incontinence, no chest pain, no dysuria, no fever, no headaches, no numbness, no pelvic pain, no perianal numbness, no tingling, no weakness and no weight loss   Risk factors: lack of exercise and obesity   Risk factors: no hx of cancer     Past Medical History  Diagnosis Date  . Hypertension   . Anxiety   . Depression   . Chronic back pain   . Arthritis   . GERD (gastroesophageal reflux disease)   . Thyroid disease     hypothyroidism  . Ventral hernia   . Hypothyroidism    Past Surgical History  Procedure Laterality Date  . Cholecystectomy    . Back surgery    . Knee surgery      left  . Cardiac catheterization  2012  . Hernia repair  02/01/11    ventral hernia   Family History  Problem Relation Age of Onset  . Cancer Mother     leukemia  . Hypertension Father   . Heart disease Father   . Heart disease Brother     pacemaker   History  Substance Use Topics  . Smoking status: Former Smoker -- 1.00 packs/day for 15 years    Types: Cigarettes  Quit date: 08/08/1998  . Smokeless tobacco: Never Used  . Alcohol Use: No   OB History   Grav Para Term Preterm Abortions TAB SAB Ect Mult Living                 Review of Systems  Constitutional: Negative for fever, chills, weight loss, fatigue (intermittently with narcotic use) and unexpected weight change.  HENT: Negative for congestion and sinus pressure.   Eyes: Negative for photophobia  and visual disturbance.  Respiratory: Negative for cough, chest tightness and shortness of breath.   Cardiovascular: Negative for chest pain and leg swelling.  Gastrointestinal: Negative for nausea (intermittent, but not currently), vomiting, abdominal pain, diarrhea, constipation, blood in stool, abdominal distention and bowel incontinence.  Genitourinary: Negative for bladder incontinence, dysuria, urgency, frequency, hematuria, flank pain, vaginal bleeding, vaginal discharge, difficulty urinating and pelvic pain.  Musculoskeletal: Positive for arthralgias (intermittent extremity pain), back pain and myalgias (intermittently in all extremities). Negative for gait problem (intermittently has "drunken" walking with narcotic use, not ongoing at this time), joint swelling, neck pain and neck stiffness.  Skin: Negative for color change.  Neurological: Positive for paresthesias (intermittently in all extremities). Negative for dizziness, tingling, tremors, seizures, syncope, speech difficulty, weakness, light-headedness (intermittently with narcotic use, not currently), numbness and headaches.  Hematological: Negative for adenopathy.  Psychiatric/Behavioral: Negative for confusion.   10 Systems reviewed and are negative for acute change except as noted in the HPI.   Allergies  Escitalopram oxalate and Buspirone hcl  Home Medications   Prior to Admission medications   Medication Sig Start Date End Date Taking? Authorizing Provider  benztropine (COGENTIN) 0.5 MG tablet Take 1 tablet (0.5 mg total) by mouth 2 (two) times daily as needed. 08/14/12   Waylan Boga, NP  DULoxetine (CYMBALTA) 60 MG capsule Take 1 capsule (60 mg total) by mouth daily. 08/14/12   Waylan Boga, NP  gabapentin (NEURONTIN) 100 MG capsule Take 1 capsule (100 mg total) by mouth 3 (three) times daily. 08/14/12   Waylan Boga, NP  levothyroxine (SYNTHROID, LEVOTHROID) 100 MCG tablet Take 1 tablet (100 mcg total) by mouth daily. Take  before breakfast with no other medications 08/14/12   Waylan Boga, NP  metoCLOPramide (REGLAN) 5 MG tablet Take one tab by mouth before meals 3 times daily when needed for nausea 01/06/13   Lafayette Dragon, MD  Multiple Vitamin (MULTIVITAMIN) capsule Take 1 capsule by mouth daily. 08/14/12   Waylan Boga, NP  omeprazole (PRILOSEC) 20 MG capsule Take 2 capsules (40 mg total) by mouth daily. 08/14/12   Waylan Boga, NP  promethazine (PHENERGAN) 12.5 MG tablet Take 1 tablet (12.5 mg total) by mouth every 6 (six) hours as needed for nausea or vomiting. 09/14/13   Lafayette Dragon, MD  QUEtiapine (SEROQUEL) 100 MG tablet Take 1 tablet (100 mg total) by mouth at bedtime. 08/14/12   Waylan Boga, NP  traZODone (DESYREL) 100 MG tablet Take 1 tablet (100 mg total) by mouth at bedtime as needed for sleep. 08/14/12   Waylan Boga, NP  triamterene-hydrochlorothiazide (MAXZIDE-25) 37.5-25 MG per tablet Take 1 each (1 tablet total) by mouth daily. 08/14/12   Waylan Boga, NP   BP 125/62  Pulse 79  Temp(Src) 97.9 F (36.6 C) (Oral)  Resp 16  SpO2 92%  LMP 08/08/1994  Physical Exam  Nursing note and vitals reviewed. Constitutional: She is oriented to person, place, and time. Vital signs are normal. She appears well-developed.  Non-toxic appearance. No distress.  VSS,  NAD, obese  HENT:  Head: Normocephalic and atraumatic.  Mouth/Throat: Oropharynx is clear and moist and mucous membranes are normal.  Eyes: Conjunctivae and EOM are normal. Right eye exhibits no discharge. Left eye exhibits no discharge.  Neck: Normal range of motion. Neck supple. No spinous process tenderness and no muscular tenderness present. No rigidity. Normal range of motion present.  FROM intact without spinous process or paraspinous muscle TTP, no bony stepoffs or deformities, no muscle spasms. No rigidity or meningeal signs. No bruising or swelling.  Cardiovascular: Normal rate, regular rhythm, normal heart sounds and intact distal pulses.  Exam  reveals no gallop and no friction rub.   No murmur heard. Distal pulses equal in all extremities, cap refill brisk and present  Pulmonary/Chest: Effort normal and breath sounds normal. No respiratory distress. She has no decreased breath sounds. She has no wheezes. She has no rhonchi. She has no rales. She exhibits no tenderness.  Abdominal: Soft. Normal appearance and bowel sounds are normal. She exhibits no distension. There is no tenderness. There is no rigidity, no rebound, no guarding and no CVA tenderness.  Musculoskeletal: Normal range of motion.       Left hip: She exhibits normal range of motion, no tenderness and no crepitus.       Cervical back: Normal.       Thoracic back: Normal.       Lumbar back: She exhibits pain (L sided paraspinous muscles). She exhibits normal range of motion, no swelling and no deformity.       Back:  Midline lumbar scar, well healed. All spinal levels with no midline TTP or bony deformity, no step offs. Lumbar spine with mild L sided paraspinous muscle tenderness in gluteal region, but no spasm noted. FROM intact in all spinal levels. Strength in b/l lower extremities 4/5, unclear if poor effort or truly decreased strength, but it is equal bilaterally. Upper extremities 5/5 bilaterally. Sensation grossly intact in all extremities, neg SLR bilaterally. Ambulatory without assistance.   Neurological: She is alert and oriented to person, place, and time. She displays no atrophy and no tremor. No sensory deficit. She exhibits normal muscle tone.  Strength as above. No tremors or atrophy. Gait nonataxic. Sensation WNL. Unable to assess DTRs due to pt positioning  Skin: Skin is warm, dry and intact. No rash noted.  Psychiatric: She has a normal mood and affect.    ED Course  Procedures (including critical care time) Labs Review Labs Reviewed  BASIC METABOLIC PANEL - Abnormal; Notable for the following:    Glucose, Bld 101 (*)    GFR calc non Af Amer 89 (*)     All other components within normal limits  CBC    Imaging Review No results found.   EKG Interpretation   Date/Time:  Thursday March 11 2014 09:36:36 EDT Ventricular Rate:  78 PR Interval:  149 QRS Duration: 96 QT Interval:  408 QTC Calculation: 465 R Axis:   51 Text Interpretation:  Sinus rhythm Similar to prior Confirmed by Mingo Amber   MD, [place] ([id number]) on 03/11/2014 10:06:00 AM      MDM   Final diagnoses:  Chronic back pain  Paresthesias/numbness    58y/o female with multiple chronic complaints, but vague complaints of paresthesias and achy extremity pain which comes and goes spontaneously and is currently resolved. Doubt MS given age. Doubt cord compression given that pt has strength and sensation preserved and at baseline. Doubt myositis or polymyalgia rheumatica although this could be  an atypical presentation. Ongoing pain and symptoms for months and years, and pt already in pain clinic and followed by a primary care physician. Checked basic labs which were unremarkable. EKG unremarkable. Will have her f/up with PCP. Likely polypharmacy and overmedication. Doubt ACS/DVT/PE. Strict return precautions given. Rx for mobic given, encouraged exercise to help with deconditioning. I explained the diagnosis and have given explicit precautions to return to the ER including for any other new or worsening symptoms. The patient understands and accepts the medical plan as it's been dictated and I have answered their questions. Discharge instructions concerning home care and prescriptions have been given. The patient is STABLE and is discharged to home in good condition.   BP 125/62  Pulse 79  Temp(Src) 97.9 F (36.6 C) (Oral)  Resp 16  SpO2 92%  LMP 08/08/1994  Meds ordered this encounter  Medications  . meloxicam (MOBIC) 7.5 MG tablet    Sig: Take 1 tablet (7.5 mg total) by mouth daily.    Dispense:  30 tablet    Refill:  0    Order Specific Question:  Supervising Provider     Answer:  Johnna Acosta [address] [place], PA-C 03/11/14 1625

## 2014-03-11 NOTE — Discharge Instructions (Signed)
Back Pain: Use mobic as needed, as directed, for ongoing pain. See your regular pain doctor/primary doctor for ongoing management of your pain. Return to the ER for changes or worsening symptoms.  Your back pain should be treated with medicines such as ibuprofen or aleve and this back pain should get better over the next 2 weeks.  However if you develop severe or worsening pain, low back pain with fever, numbness, weakness or inability to walk or urinate, you should return to the ER immediately.  Please follow up with your doctor this week for a recheck if still having symptoms.  Low back pain is discomfort in the lower back that may be due to injuries to muscles and ligaments around the spine.  Occasionally, it may be caused by a a problem to a part of the spine called a disc.  The pain may last several days or a week;  However, most patients get completely well in 4 weeks.  Self - care:  The application of heat can help soothe the pain.  Maintaining your daily activities, including walking, is encourged, as it will help you get better faster than just staying in bed. Perform gentle stretching as discussed. Drink plenty of fluids.  Medications are also useful to help with pain control.  A commonly prescribed medications includes acetaminophen.  This medication is generally safe, though you should not take more than 6 of the extra strength (500mg ) pills a day.  Non steroidal anti inflammatory medications including Ibuprofen and naproxen;  These medications help both pain and swelling and are very useful in treating back pain.  They should be taken with food, as they can cause stomach upset, and more seriously, stomach bleeding.     SEEK IMMEDIATE MEDICAL ATTENTION IF: New numbness, tingling, weakness, or problem with the use of your arms or legs.  Severe back pain not relieved with medications.  Difficulty with or loss of control of your bowel or bladder control.  Increasing pain in any areas of  the body (such as chest or abdominal pain).  Shortness of breath, dizziness or fainting.  Nausea (feeling sick to your stomach), vomiting, fever, or sweats.  You will need to follow up with  Your primary healthcare provider in 1-2 weeks for reassessment.   Chronic Back Pain  When back pain lasts longer than 3 months, it is called chronic back pain.People with chronic back pain often go through certain periods that are more intense (flare-ups).  CAUSES Chronic back pain can be caused by wear and tear (degeneration) on different structures in your back. These structures include:  The bones of your spine (vertebrae) and the joints surrounding your spinal cord and nerve roots (facets).  The strong, fibrous tissues that connect your vertebrae (ligaments). Degeneration of these structures may result in pressure on your nerves. This can lead to constant pain. HOME CARE INSTRUCTIONS  Avoid bending, heavy lifting, prolonged sitting, and activities which make the problem worse.  Take brief periods of rest throughout the day to reduce your pain. Lying down or standing usually is better than sitting while you are resting.  Take over-the-counter or prescription medicines only as directed by your caregiver. SEEK IMMEDIATE MEDICAL CARE IF:   You have weakness or numbness in one of your legs or feet.  You have trouble controlling your bladder or bowels.  You have nausea, vomiting, abdominal pain, shortness of breath, or fainting. Document Released: 06/28/2004 Document Revised: 08/13/2011 Document Reviewed: 05/05/2011 [facility] Patient Information 2015 [place], [place].  This information is not intended to replace advice given to you by your health care provider. Make sure you discuss any questions you have with your health care provider.  Back Exercises Back exercises help treat and prevent back injuries. The goal is to increase your strength in your belly (abdominal) and back muscles. These  exercises can also help with flexibility. Start these exercises when told by your doctor. HOME CARE Back exercises include: Pelvic Tilt.  Lie on your back with your knees bent. Tilt your pelvis until the lower part of your back is against the floor. Hold this position 5 to 10 sec. Repeat this exercise 5 to 10 times. Knee to Chest.  Pull 1 knee up against your chest and hold for 20 to 30 seconds. Repeat this with the other knee. This may be done with the other leg straight or bent, whichever feels better. Then, pull both knees up against your chest. Sit-Ups or Curl-Ups.  Bend your knees 90 degrees. Start with tilting your pelvis, and do a partial, slow sit-up. Only lift your upper half 30 to 45 degrees off the floor. Take at least 2 to 3 seonds for each sit-up. Do not do sit-ups with your knees out straight. If partial sit-ups are difficult, simply do the above but with only tightening your belly (abdominal) muscles and holding it as told. Hip-Lift.  Lie on your back with your knees flexed 90 degrees. Push down with your feet and shoulders as you raise your hips 2 inches off the floor. Hold for 10 seconds, repeat 5 to 10 times. Back Arches.  Lie on your stomach. Prop yourself up on bent elbows. Slowly press on your hands, causing an arch in your low back. Repeat 3 to 5 times. Shoulder-Lifts.  Lie face down with arms beside your body. Keep hips and belly pressed to floor as you slowly lift your head and shoulders off the floor. Do not overdo your exercises. Be careful in the beginning. Exercises may cause you some mild back discomfort. If the pain lasts for more than 15 minutes, stop the exercises until you see your doctor. Improvement with exercise for back problems is slow.  Document Released: 06/23/2010 Document Revised: 08/13/2011 Document Reviewed: 03/22/2011 [facility] Patient Information 2015 [place], [place]. This information is not intended to replace advice given to you by your health  care provider. Make sure you discuss any questions you have with your health care provider.  Paresthesia Paresthesia is a burning or prickling feeling. This feeling can happen in any part of the body. It often happens in the hands, arms, legs, or feet. HOME CARE  Avoid drinking alcohol.  Try massage or needle therapy (acupuncture) to help with your problems.  Keep all doctor visits as told. GET HELP RIGHT AWAY IF:   You feel weak.  You have trouble walking or moving.  You have problems speaking or seeing.  You feel confused.  You cannot control when you poop (bowel movement) or pee (urinate).  You lose feeling (numbness) after an injury.  You pass out (faint).  Your burning or prickling feeling gets worse when you walk.  You have pain, cramps, or feel dizzy.  You have a rash. MAKE SURE YOU:   Understand these instructions.  Will watch your condition.  Will get help right away if you are not doing well or get worse. Document Released: 05/03/2008 Document Revised: 08/13/2011 Document Reviewed: 02/09/2011 [facility] Patient Information 2015 [place], [place]. This information is not intended to replace advice  given to you by your health care provider. Make sure you discuss any questions you have with your health care provider. ° °

## 2014-03-11 NOTE — ED Notes (Signed)
Pt reports HA, nausea, sleepiness, bilateral arm numbness and difficulty walking that "has been going on for a while" but got worse this am. Has been going to pain clinic and has been on multiple trials of different pain medications. Last visit a few weeks ago. Pt unsure if she took muscle relaxer this am. Pt reports arm "numbness" from mid upper arm down to hands. Able to feel sensation and move arms. EMS gave 8mg  zofran prior to arrival, pt reports nausea better. Also reports lower back pain since lying on stretcher. Also took xanax prior to arrival for anxiety.

## 2014-03-11 NOTE — ED Provider Notes (Signed)
Medical screening examination/treatment/procedure(s) were conducted as a shared visit with non-physician practitioner(s) and myself.  I personally evaluated the patient during the encounter.   EKG Interpretation   Date/Time:  Thursday March 11 2014 09:36:36 EDT Ventricular Rate:  78 PR Interval:  149 QRS Duration: 96 QT Interval:  408 QTC Calculation: 465 R Axis:   51 Text Interpretation:  Sinus rhythm Similar to prior Confirmed by Mingo Amber   MD, [place] ([id number]) on 03/11/2014 10:06:00 AM      Patient here with chronic pain, multiple areas of patchy numbness. No symptoms c/w stroke. Also having out of body experience and feeling when patient takes pain meds, likely due to over medication. Patient's w/u unremarkable. Exam nonfocal. Stable for discharge.  Evelina Bucy, MD 03/11/14 1710

## 2014-03-12 ENCOUNTER — Other Ambulatory Visit: Payer: Self-pay | Admitting: Physical Medicine and Rehabilitation

## 2014-03-12 DIAGNOSIS — M545 Low back pain, unspecified: Secondary | ICD-10-CM

## 2014-03-12 DIAGNOSIS — M79605 Pain in left leg: Secondary | ICD-10-CM

## 2014-03-24 ENCOUNTER — Ambulatory Visit
Admission: RE | Admit: 2014-03-24 | Discharge: 2014-03-24 | Disposition: A | Discharge status: Home / Self Care | Payer: BC Managed Care – PPO | Source: Ambulatory Visit | Attending: Physical Medicine and Rehabilitation | Admitting: Physical Medicine and Rehabilitation

## 2014-03-24 DIAGNOSIS — M545 Low back pain, unspecified: Secondary | ICD-10-CM

## 2014-03-24 DIAGNOSIS — M79605 Pain in left leg: Secondary | ICD-10-CM

## 2014-03-24 MED ORDER — GADOBENATE DIMEGLUMINE 529 MG/ML IV SOLN
20.0000 mL | Freq: Once | INTRAVENOUS | Status: AC | PRN
  Administered 2014-03-24: 20 mL via INTRAVENOUS

## 2014-05-03 ENCOUNTER — Other Ambulatory Visit: Payer: Self-pay | Admitting: Internal Medicine

## 2014-05-03 ENCOUNTER — Other Ambulatory Visit (HOSPITAL_COMMUNITY)
Admission: RE | Admit: 2014-05-03 | Discharge: 2014-05-03 | Disposition: A | Discharge status: Home / Self Care | Payer: BC Managed Care – PPO | Source: Ambulatory Visit | Attending: Internal Medicine | Admitting: Internal Medicine

## 2014-05-03 DIAGNOSIS — Z01419 Encounter for gynecological examination (general) (routine) without abnormal findings: Secondary | ICD-10-CM | POA: Insufficient documentation

## 2014-05-04 ENCOUNTER — Ambulatory Visit
Admission: RE | Admit: 2014-05-04 | Discharge: 2014-05-04 | Disposition: A | Discharge status: Home / Self Care | Payer: BC Managed Care – PPO | Source: Ambulatory Visit | Attending: Internal Medicine | Admitting: Internal Medicine

## 2014-05-04 DIAGNOSIS — N63 Unspecified lump in unspecified breast: Secondary | ICD-10-CM

## 2014-05-06 LAB — CYTOLOGY - PAP

## 2014-07-01 ENCOUNTER — Telehealth: Payer: Self-pay | Admitting: Internal Medicine

## 2014-07-02 ENCOUNTER — Encounter: Payer: Self-pay | Admitting: *Deleted

## 2014-07-02 NOTE — Telephone Encounter (Signed)
Patient with chronic constipation.  She has RA and back pain and see rheumatology and a pain clinic.  She is on chronic narcotics for the pain.  She has not had a BM for 3 weeks.  Her pain clinic has asked that she call us.  She has been taking senna and fiber with no results.  She will try Miralax TID for the next several days.  She will increase the amount of fluid in her diet.  She will come in and see Cecille Rubin Hvozdovic, PA on 07/06/14 1:15.  She will call back for any additional questions or concerns

## 2014-07-06 ENCOUNTER — Ambulatory Visit (INDEPENDENT_AMBULATORY_CARE_PROVIDER_SITE_OTHER): Payer: BLUE CROSS/BLUE SHIELD | Admitting: Physician Assistant

## 2014-07-06 ENCOUNTER — Other Ambulatory Visit (INDEPENDENT_AMBULATORY_CARE_PROVIDER_SITE_OTHER): Payer: BLUE CROSS/BLUE SHIELD

## 2014-07-06 ENCOUNTER — Encounter: Payer: Self-pay | Admitting: Physician Assistant

## 2014-07-06 VITALS — BP 128/78 | HR 78 | Ht 67.0 in | Wt 255.4 lb

## 2014-07-06 DIAGNOSIS — R1013 Epigastric pain: Secondary | ICD-10-CM

## 2014-07-06 DIAGNOSIS — R110 Nausea: Secondary | ICD-10-CM

## 2014-07-06 DIAGNOSIS — R1310 Dysphagia, unspecified: Secondary | ICD-10-CM

## 2014-07-06 LAB — CBC WITH DIFFERENTIAL/PLATELET
BASOS ABS: 0.1 10*3/uL (ref 0.0–0.1)
BASOS PCT: 1 % (ref 0.0–3.0)
EOS ABS: 0.1 10*3/uL (ref 0.0–0.7)
Eosinophils Relative: 1 % (ref 0.0–5.0)
HCT: 43.6 % (ref 36.0–46.0)
Hemoglobin: 14.9 g/dL (ref 12.0–15.0)
LYMPHS PCT: 40.5 % (ref 12.0–46.0)
Lymphs Abs: 2.6 10*3/uL (ref 0.7–4.0)
MCHC: 34.2 g/dL (ref 30.0–36.0)
MCV: 86.9 fl (ref 78.0–100.0)
Monocytes Absolute: 0.4 10*3/uL (ref 0.1–1.0)
Monocytes Relative: 5.5 % (ref 3.0–12.0)
NEUTROS ABS: 3.3 10*3/uL (ref 1.4–7.7)
NEUTROS PCT: 52 % (ref 43.0–77.0)
PLATELETS: 303 10*3/uL (ref 150.0–400.0)
RBC: 5.01 Mil/uL (ref 3.87–5.11)
RDW: 14.4 % (ref 11.5–15.5)
WBC: 6.4 10*3/uL (ref 4.0–10.5)

## 2014-07-06 LAB — COMPREHENSIVE METABOLIC PANEL
ALT: 18 U/L (ref 0–35)
AST: 14 U/L (ref 0–37)
Albumin: 4.5 g/dL (ref 3.5–5.2)
Alkaline Phosphatase: 80 U/L (ref 39–117)
BILIRUBIN TOTAL: 0.4 mg/dL (ref 0.2–1.2)
BUN: 8 mg/dL (ref 6–23)
CALCIUM: 10 mg/dL (ref 8.4–10.5)
CHLORIDE: 98 meq/L (ref 96–112)
CO2: 30 mEq/L (ref 19–32)
CREATININE: 1.03 mg/dL (ref 0.40–1.20)
GFR: 58.08 mL/min — ABNORMAL LOW (ref >60.00)
Glucose, Bld: 93 mg/dL (ref 70–99)
Potassium: 3.6 mEq/L (ref 3.5–5.1)
Sodium: 135 mEq/L (ref 135–145)
TOTAL PROTEIN: 7.5 g/dL (ref 6.0–8.3)

## 2014-07-06 LAB — AMYLASE: Amylase: 19 U/L — ABNORMAL LOW (ref 27–131)

## 2014-07-06 LAB — LIPASE: LIPASE: 17 U/L (ref 11.0–59.0)

## 2014-07-06 MED ORDER — PANTOPRAZOLE SODIUM 40 MG PO TBEC: 40.0000 mg | DELAYED_RELEASE_TABLET | Freq: Two times a day (BID) | ORAL | Status: DC

## 2014-07-06 NOTE — Patient Instructions (Signed)
Your physician has requested that you go to the basement for the following lab work before leaving today: CBC, CMET, Amylase, lipase  You have been scheduled for an endoscopy. Please follow written instructions given to you at your visit today. If you use inhalers (even only as needed), please bring them with you on the day of your procedure. Your physician has requested that you go to www.startemmi.com and enter the access code given to you at your visit today. This web site gives a general overview about your procedure. However, you should still follow specific instructions given to you by our office regarding your preparation for the procedure.  You have been scheduled for a Barium Esophogram at [facility] (1st floor of the hospital) on Thursday, 07/08/14 at 11:30 am. Please arrive 15 minutes prior to your appointment for registration. Make certain not to have anything to eat or drink 4 hours prior to your test. If you need to reschedule for any reason, please contact radiology at [phone] to do so. __________________________________________________________________ A barium swallow is an examination that concentrates on views of the esophagus. This tends to be a double contrast exam (barium and two liquids which, when combined, create a gas to distend the wall of the oesophagus) or single contrast (non-ionic iodine based). The study is usually tailored to your symptoms so a good history is essential. Attention is paid during the study to the form, structure and configuration of the esophagus, looking for functional disorders (such as aspiration, dysphagia, achalasia, motility and reflux) EXAMINATION You may be asked to change into a gown, depending on the type of swallow being performed. A radiologist and radiographer will perform the procedure. The radiologist will advise you of the type of contrast selected for your procedure and direct you during the exam. You will be asked to stand,  sit or lie in several different positions and to hold a small amount of fluid in your mouth before being asked to swallow while the imaging is performed .In some instances you may be asked to swallow barium coated marshmallows to assess the motility of a solid food bolus. The exam can be recorded as a digital or video fluoroscopy procedure. POST PROCEDURE It will take 1-2 days for the barium to pass through your system. To facilitate this, it is important, unless otherwise directed, to increase your fluids for the next 24-48hrs and to resume your normal diet.  This test typically takes about 30 minutes to perform. __________________________________________________________________________________  Please discontinue omeprazole.  We have sent the following medications to your pharmacy for you to pick up at your convenience: Pantoprazole 40 mg twice daily (in place of omeprazole)  Please make certain to have small volumes of food per setting and low fat feedings.  CC:Dr Seward Carol

## 2014-07-06 NOTE — Progress Notes (Signed)
Patient ID: Christine Robles, female   DOB: Jun 20, 1955, 59 y.o.   MRN: [id number]    HPI:    Christine Robles is a 59 year old female referred by Dr. Delfina Redwood due to complaints of nausea and dysphagia.  Christine Robles has a long-standing history of chronic nausea for at least 17 years duration. In the past she has used Zofran with varying relief. She had a gastric emptying scan in 2000 that showed 90% retention in 2 hours. A repeat gastric emptying scan in 2005 was normal. She has depression. He is on chronic narcotic medication. She has severe rheumatoid arthritis as well. More recently she has been troubled with worsening nausea. Her nausea has been intolerable for 2 months. She feels over the past 2 months her food gets stuck in the proximal esophagus and then when it does past of the stomach does not move out of the stomach.She hasn't vomited clear fluid several hours after eating. Her nausea is typically worse after eating or drinking. Her bowel movements have been moving with the assistance of Mira lax, Colace, and Senokot. She has no bright red blood per rectum or melena, she states her stools have been a light orange or light gray color 2-3 weeks she has epigastric pain worse on an empty stomach. She has no dysphagia to liquids.   Past Medical History  Diagnosis Date  . Hypertension   . Anxiety   . Depression   . Chronic back pain   . Arthritis   . GERD (gastroesophageal reflux disease)   . Thyroid disease     hypothyroidism  . Ventral hernia   . Hypothyroidism   . Esophagitis   . Gastroparesis   . Candida esophagitis   . Spinal stenosis   . IBS (irritable bowel syndrome)   . Panic disorder     Past Surgical History  Procedure Laterality Date  . Cholecystectomy    . Back surgery    . Knee surgery      left  . Cardiac catheterization  2012  . Hernia repair  02/01/11    ventral hernia  . Tubal ligation    . Breast biopsy    . Tonsillectomy    . Spinal fusion     Family History  Problem  Relation Age of Onset  . Leukemia Mother   . Hypertension Father   . Heart disease Father   . Heart disease Brother     pacemaker  . Cirrhosis Father   . Colon cancer Neg Hx   . Heart disease Maternal Grandmother   . Crohn's disease Father   . Diabetes Brother   . Diabetes      grandfather   History  Substance Use Topics  . Smoking status: Former Smoker -- 1.00 packs/day for 15 years    Types: Cigarettes    Quit date: 08/08/1998  . Smokeless tobacco: Never Used  . Alcohol Use: No   Current Outpatient Prescriptions  Medication Sig Dispense Refill  . ALPRAZolam (XANAX) 1 MG tablet Take 1 mg by mouth 3 (three) times daily as needed for anxiety.    . benztropine (COGENTIN) 0.5 MG tablet Take 1 tablet (0.5 mg total) by mouth 2 (two) times daily as needed. 60 tablet 0  . DULoxetine (CYMBALTA) 60 MG capsule Take 60 mg by mouth 2 (two) times daily.    . folic acid (FOLVITE) 1 MG tablet Take 1 mg by mouth 3 (three) times daily.    Marland Kitchen gabapentin (NEURONTIN) 300 MG capsule Take 300 mg  by mouth 3 (three) times daily.    . InFLIXimab (REMICADE IV) Inject into the vein. As directed    . lamoTRIgine (LAMICTAL) 200 MG tablet Take 200 mg by mouth at bedtime.    Marland Kitchen levothyroxine (SYNTHROID, LEVOTHROID) 100 MCG tablet Take 1 tablet (100 mcg total) by mouth daily. Take before breakfast with no other medications 30 tablet 0  . meloxicam (MOBIC) 7.5 MG tablet Take 1 tablet (7.5 mg total) by mouth daily. 30 tablet 0  . methotrexate 2.5 MG tablet Take by mouth once a week.    . Multiple Vitamin (MULTIVITAMIN) capsule Take 1 capsule by mouth daily. 30 capsule 0  . ondansetron (ZOFRAN) 8 MG tablet Take 8 mg by mouth 2 (two) times daily as needed for nausea or vomiting.    . OxyCODONE (OXYCONTIN) 20 mg T12A 12 hr tablet Take 20 mg by mouth every 12 (twelve) hours.    Marland Kitchen oxyCODONE-acetaminophen (PERCOCET) 10-325 MG per tablet Take 0.5-1 tablets by mouth every 6 (six) hours as needed for pain.    .  promethazine (PHENERGAN) 12.5 MG tablet Take 1 tablet (12.5 mg total) by mouth every 6 (six) hours as needed for nausea or vomiting. 15 tablet 0  . tiZANidine (ZANAFLEX) 2 MG tablet Take 2-4 mg by mouth 3 (three) times daily as needed for muscle spasms.    . traZODone (DESYREL) 150 MG tablet Take 150 mg by mouth at bedtime.    . triamterene-hydrochlorothiazide (MAXZIDE-25) 37.5-25 MG per tablet Take 1 each (1 tablet total) by mouth daily. 30 tablet 0  . pantoprazole (PROTONIX) 40 MG tablet Take 1 tablet (40 mg total) by mouth 2 (two) times daily. 60 tablet 2   No current facility-administered medications for this visit.   Allergies  Allergen Reactions  . Escitalopram Oxalate Other (See Comments)    Stroke like symptoms. Pt thinks she may have been given too big a dose.  Marland Kitchen Buspirone Hcl Other (See Comments)    Abnormal behavior  . Fentanyl Other (See Comments)    Headache   . Morphine And Related Other (See Comments)    headache     Review of Systems: Gen: Denies any fever, chills, sweats, anorexia, fatigue, weakness, malaise, weight loss, and sleep disorder CV: Denies chest pain, angina, palpitations, syncope, orthopnea, PND, peripheral edema, and claudication. Resp: Denies dyspnea at rest, dyspnea with exercise, cough, sputum, wheezing, coughing up blood, and pleurisy. GI: Denies vomiting blood, jaundice, and fecal incontinence.  Has dysphagia to solids. GU : Denies urinary burning, blood in urine, urinary frequency, urinary hesitancy, nocturnal urination, and urinary incontinence. MS: Denies joint pain, limitation of movement, and swelling, stiffness, low back pain, extremity pain. Denies muscle weakness, cramps, atrophy.  Derm: Denies rash, itching, dry skin, hives, moles, warts, or unhealing ulcers.  Psych: Denies depression, anxiety, memory loss, suicidal ideation, hallucinations, paranoia, and confusion. Heme: Denies bruising, bleeding, and enlarged lymph nodes. Neuro:  Denies  any headaches, dizziness, paresthesias. Endo:  Denies any problems with DM, thyroid, adrenal function  Prior Endoscopies:   EGD 08/19/2007 ENDO/OSCOPIES  Status: Finalresult Visible to patient:  MyChart Nextappt: 07/08/2014 at 11:30 AM in Radiology ([facility] R/F 1)       Specimen Collected: 08/19/07 10:07 AM Last Resulted: 07/06/10 12:56 PM              Transcription     Type ID Date and Time Christine Robles [id number] 03/04/2009 10:07 AM Christine Dragon, Christine Robles    Signed by  Christine Robles, Christine Robles   Document Text    Expand All Collapse All   Summary: ENDOSCOPY   EGD  Procedure date: 08/19/2007  Findings:  Location: [facility]       Patient Name: Christine Robles, Christine Robles MRN:  Procedure Procedures: Panendoscopy (EGD) CPT: 78469.   with biopsy(s)/brushing(s). CPT: U5434024.  Personnel: Endoscopist: Christine L. Olevia Perches, Christine Robles.  Exam Location: Exam performed in Outpatient Christine. Outpatient  Patient Consent: Procedure, Alternatives, Risks and Benefits discussed, consent obtained, from patient. Consent was obtained by the RN.  Indications Symptoms: Reflux symptoms for <1 yr,  Comments: recent Dx of dysplastic lesion of the roof of the mouth History  Current Medications: Patient is not currently taking Coumadin.  Pre-Exam Physical: Performed Aug 19, 2007 Entire physical exam was normal.  Comments: Pt. history reviewed/updated, physical exam performed prior to initiation of sedation?yes Exam Exam Info: Maximum depth of insertion Duodenum, intended Duodenum. Vocal cords visualized. Gastric retroflexion performed. Images taken. ASA Classification: I. Tolerance: good.  Sedation Meds: Patient assessed and found to be appropriate for moderate (conscious) sedation. Fentanyl 75 mcg. given IV. Versed 7 mg. given IV. Cetacaine Spray 2 sprays given aerosolized.  Monitoring: BP and pulse  monitoring done. Oximetry used. Supplemental O2 given  Findings - ESOPHAGEAL INFLAMMATION: suspected as a result of infectious esophagitis. Length of inflammation: 10 cm. [place] Classification: Grade 0. Biopsy/Esoph Inflamtn taken. ICD9: Esophagitis, Unspecified: 530.10. Comments: whitish exudate throughout the esophagus c/w Candida esophagitis.  - Normal: Distal Esophagus.  - DIAGNOSTIC TEST: from Antrum. RUT done, results pending  Normal: Duodenal Apex.   Assessment Abnormal examination, see findings above.  Diagnoses: 530.10: Esophagitis, Unspecified.   Comments: suspect candida esophagitis Events  Unplanned Intervention: No unplanned interventions were required.  Unplanned Events: There were no complications. Plans Medication(s): Await pathology. Nexiem: 40 QD, starting Aug 19, 2007  Diflucan: 150mg  QD, starting Aug 19, 2007 for 1 dose, may repat.   Patient Education: Patient given standard instructions for: Diflucan will need to be repeated periodically as lon as pt remains on topical steroids.  Disposition: After procedure patient sent to recovery. After recovery patient sent home.    CC: Christine College Murrah,Christine Robles        Physical Exam: BP 128/78 mmHg  Pulse 78  Ht 5\' 7"  (1.702 m)  Wt 255 lb 6.4 oz (115.849 kg)  BMI 39.99 kg/m2  LMP 08/08/1994 Constitutional: Pleasant,well-developed female in no acute distress. HEENT: Normocephalic and atraumatic. Conjunctivae are normal. No scleral icterus. Neck supple. No thyromegaly Cardiovascular: Normal rate, regular rhythm.  Pulmonary/chest: Effort normal and breath sounds normal. No wheezing, rales or rhonchi. Abdominal: Soft, nondistended, nontender. Bowel sounds active throughout. There are no masses palpable. No hepatomegaly. Extremities: no edema Lymphadenopathy: No cervical adenopathy noted. Neurological: Alert and oriented to person place and time. Skin: Skin is warm  and dry. No rashes noted. Psychiatric: Normal mood and affect. Behavior is normal.  ASSESSMENT AND PLAN: 59 year old female with a known history of GERD and gastroparesis here for evaluation of epigastric pain and nausea. She has been experiencing progressive dysphagia. She will be scheduled for an esophagram with barium pill and several days later be scheduled for an EGD with possible dilation.The risks, benefits, and alternatives to endoscopy with possible biopsy and possible dilation were discussed with the patient and they consent to proceed. The procedure will be scheduled with Dr. Olevia Robles. As the patient has not had any relief with omeprazole, she will discontinue  the omeprazole and try pantoprazole 40 mg twice a day. She's been advised to adhere to small-volume frequent low-fat feedings. The patient says she is unable to adhere to a gastroparesis diet because she has tried in the past and didn't like it. Further recommendations will be made pending the findings of the above.    Christine Robles, Christine Robles 07/06/2014, 4:10 PM

## 2014-07-07 ENCOUNTER — Encounter: Payer: BLUE CROSS/BLUE SHIELD | Admitting: Internal Medicine

## 2014-07-07 NOTE — Progress Notes (Signed)
Reviewed and agree. ? Reglan?

## 2014-07-08 ENCOUNTER — Ambulatory Visit (HOSPITAL_COMMUNITY)
Admission: RE | Admit: 2014-07-08 | Discharge: 2014-07-08 | Disposition: A | Discharge status: Home / Self Care | Payer: BLUE CROSS/BLUE SHIELD | Source: Ambulatory Visit | Attending: Physician Assistant | Admitting: Physician Assistant

## 2014-07-08 DIAGNOSIS — R1310 Dysphagia, unspecified: Secondary | ICD-10-CM

## 2014-07-09 ENCOUNTER — Telehealth: Payer: Self-pay

## 2014-07-09 NOTE — Telephone Encounter (Signed)
Pt had been on omeprazole bid which has the same interaction and has been ok.  She can try the omeprazole but needs to notify her rheumatologist or provider who prescribes her methotrexate for her rheumatoid arthritis that she is on a BID PPI as they may want to monitor her labs.

## 2014-07-09 NOTE — Telephone Encounter (Signed)
[facility] has sent over a message stating that the pantoprazole 40mg  twice a day is popping up with a drug interaction with methotrexate.  Will route to [employer] for her to advise.

## 2014-07-09 NOTE — Telephone Encounter (Signed)
[facility] informed and will pass information onto the patient.

## 2014-07-23 ENCOUNTER — Telehealth: Payer: Self-pay | Admitting: Internal Medicine

## 2014-07-23 MED ORDER — METOCLOPRAMIDE HCL 5 MG PO TABS: 5.0000 mg | ORAL_TABLET | Freq: Three times a day (TID) | ORAL | Status: DC

## 2014-07-23 NOTE — Telephone Encounter (Signed)
Spoke with patient and gave her recommendations and side effects. Rx sent to pharmacy.

## 2014-07-23 NOTE — Telephone Encounter (Signed)
Patient states she is still having the severe nausea. She is using the Zofran BID. She is asking if there is anything she can do until the procedure next Tuesday. Please, advise.

## 2014-07-23 NOTE — Telephone Encounter (Signed)
Can pt try reglan 5 mg AC? Let her know side effect profile. If she has trouble tolerating reglan she should d/c it immediately.

## 2014-07-27 ENCOUNTER — Ambulatory Visit (AMBULATORY_SURGERY_CENTER): Payer: BLUE CROSS/BLUE SHIELD | Admitting: Internal Medicine

## 2014-07-27 ENCOUNTER — Encounter: Payer: Self-pay | Admitting: Internal Medicine

## 2014-07-27 VITALS — BP 139/72 | HR 72 | Temp 98.4°F | Resp 25 | Ht 67.0 in | Wt 255.0 lb

## 2014-07-27 DIAGNOSIS — R1310 Dysphagia, unspecified: Secondary | ICD-10-CM

## 2014-07-27 DIAGNOSIS — K219 Gastro-esophageal reflux disease without esophagitis: Secondary | ICD-10-CM

## 2014-07-27 MED ORDER — SODIUM CHLORIDE 0.9 % IV SOLN: 500.0000 mL | INTRAVENOUS | Status: DC

## 2014-07-27 NOTE — Progress Notes (Signed)
Patient is a poor historian regarding her medications.  She was very unsure of when and what she took as far as meds are concerned.

## 2014-07-27 NOTE — Patient Instructions (Signed)
YOU HAD AN ENDOSCOPIC PROCEDURE TODAY AT [facility]: Refer to the procedure report that was given to you for any specific questions about what was found during the examination.  If the procedure report does not answer your questions, please call your gastroenterologist to clarify.  If you requested that your care partner not be given the details of your procedure findings, then the procedure report has been included in a sealed envelope for you to review at your convenience later.  YOU SHOULD EXPECT: Some feelings of bloating in the abdomen. Passage of more gas than usual.  Walking can help get rid of the air that was put into your GI tract during the procedure and reduce the bloating.  DIET:  Follow dilation diet today- see handout.  Drink plenty of fluids but you should avoid alcoholic beverages for 24 hours.  ACTIVITY: Your care partner should take you home directly after the procedure.  You should plan to take it easy, moving slowly for the rest of the day.  You can resume normal activity the day after the procedure however you should NOT DRIVE or use heavy machinery for 24 hours (because of the sedation medicines used during the test).    SYMPTOMS TO REPORT IMMEDIATELY: A gastroenterologist can be reached at any hour.  During normal business hours, 8:30 AM to 5:00 PM Monday through Friday, call [phone].  After hours and on weekends, please call the GI answering service at [phone] who will take a message and have the physician on call contact you.  Following upper endoscopy (EGD)  Vomiting of blood or coffee ground material  New chest pain or pain under the shoulder blades  Painful or persistently difficult swallowing  New shortness of breath  Fever of 100F or higher  Black, tarry-looking stools  FOLLOW UP: Our staff will call the home number listed on your records the next business day following your procedure to check on you and address any questions or  concerns that you may have at that time regarding the information given to you following your procedure. This is a courtesy call and so if there is no answer at the home number and we have not heard from you through the emergency physician on call, we will assume that you have returned to your regular daily activities without incident.  SIGNATURES/CONFIDENTIALITY: You and/or your care partner have signed paperwork which will be entered into your electronic medical record.  These signatures attest to the fact that that the information above on your After Visit Summary has been reviewed and is understood.  Full responsibility of the confidentiality of this discharge information lies with you and/or your care-partner.  Follow dilation diet- see handout  Please read over handout about anti-reflux regimen  Eat small frequent meals  Take Reglan 5 mg tablet before meals, may be increased to 10 mg   continue Pantoprazole 40 mg twice daily- 30 minutes before breakfast and supper

## 2014-07-27 NOTE — Op Note (Addendum)
[facility]  [address] [place], [id number]   ENDOSCOPY PROCEDURE REPORT  PATIENT: Christine Robles, Christine Robles  MR#: [id number] BIRTHDATE: 01-10-1956 , 2  yrs. old GENDER: female ENDOSCOPIST: Lafayette Dragon, MD REFERRED BY: Seward Carol M.D. PROCEDURE DATE:  07/27/2014 PROCEDURE:  EGD, diagnostic and Maloney dilation of esophagus ASA CLASS:     Class II INDICATIONS:  dysphagia to solids and liquids.  Nausea, epigastric pain.  Last upper endoscopy in 2009 showed esophagitis.  She has a history of gastroparesis on gastric emptying scan in 2000.  GES was normal in 2005.  Patient has a depression..  Dependent on narcotics.  Multiple medications include OxyContin, Zanaflex, Zofran, Lamictal, Cogentin and Xanax. MEDICATIONS: Monitored anesthesia care and Propofol 150 mg IV TOPICAL ANESTHETIC: none  DESCRIPTION OF PROCEDURE: After the risks benefits and alternatives of the procedure were thoroughly explained, informed consent was obtained.  The LB GIF-H180 Loaner [id number] endoscope was introduced through the mouth and advanced to the second portion of the duodenum , Without limitations.  The instrument was slowly withdrawn as the mucosa was fully examined.    Esophagus: esophagus was intubated without difficulty. Proximal, mid and distal esophageal mucosa appeared normal. Squamocolumnar junction was normal. There was no stricture  Stomach: stomach was insufflated with air and did not show any retained food. There was no hiatal hernia. Gastric folds were normal. Gastric antrum and pyloric outlet were normal. The retroflexion of the endoscope in the normal fundus and cardia  Duodenum: duodenal bulb and descending duodenum was normal  Maloney dilator 64 [place] passed carefully through the esophagus into the stomach with  mild resistance. There was no blood on the dilator[          The scope was then withdrawn from the patient and the procedure completed.  COMPLICATIONS:  There were no immediate complications.  ENDOSCOPIC IMPRESSION: 1. no evidence of esophageal stricture 2. Passage of 52 [place] Maloney dilator 3. Essentially normal esophagus stomach and duodenum Patient's symptoms of epigastric pain and nausea as well as dysphagia may be related to multiple psychotropic medications which slow down esophageal and gastric motility. I suggest to reduce some of them if possible  RECOMMENDATIONS: continue  Pantoprazole 40 mg twice a day Antireflux measures small frequent feedings Reglan 5 mg before meals, may be increased to 10 mg  REPEAT EXAM: no  eSigned:  Lafayette Dragon, MD 07/27/2014 4:45 PM Revised: 07/27/2014 4:45 PM   CC:  PATIENT NAME:  Christine Robles, Christine Robles MR#: [id number]

## 2014-07-27 NOTE — Progress Notes (Signed)
Stable to RR 

## 2014-07-27 NOTE — Progress Notes (Signed)
Called to room to assist during endoscopic procedure.  Patient ID and intended procedure confirmed with present staff. Received instructions for my participation in the procedure from the performing physician.  

## 2014-07-28 ENCOUNTER — Telehealth: Payer: Self-pay

## 2014-07-28 NOTE — Telephone Encounter (Signed)
  Follow up Call-  Call back number 07/27/2014  Post procedure Call Back phone  # [phone]  Permission to leave phone message Yes     Patient questions:  Do you have a fever, pain , or abdominal swelling? No. Pain Score  0 *  Have you tolerated food without any problems? Yes.    Have you been able to return to your normal activities? Yes.    Do you have any questions about your discharge instructions: Diet   Yes.   Medications  Yes.   Follow up visit  Yes.    Do you have questions or concerns about your Care? Yes.    Actions: * If pain score is 4 or above: No action needed, pain <4. Advised Christine Robles to gargle warm salt water every 1-11/2 hours for sore throat and drink plenty of fluids today. Also advised her to call back if sore throat doesn't improve.

## 2014-08-04 ENCOUNTER — Telehealth: Payer: Self-pay | Admitting: Internal Medicine

## 2014-08-04 ENCOUNTER — Encounter: Payer: Self-pay | Admitting: *Deleted

## 2014-08-04 NOTE — Telephone Encounter (Signed)
Patient states she is having her Remicade infusion tomorrow and usually takes Zantac and Claritin 3 days prior to infusion. She has been taking this for the last 2 days and was told to call Dr. Olevia Perches and see if she should take this with the medications Dr. Olevia Perches gave her. Patient states she has been throwing up again. She is taking Reglan before meals and she is taking Pantoprazole BID. She is not taking Zofran. Please, advise.

## 2014-08-04 NOTE — Telephone Encounter (Signed)
Please increase Reglan to 10 mg 3x/day. Also send Phenergan 25 mg, #30, 1 po q 6 hours prn nausea. 1 refill

## 2014-08-04 NOTE — Telephone Encounter (Signed)
Left a message for patient to call back. 

## 2014-08-05 MED ORDER — PROMETHAZINE HCL 25 MG PO TABS: 25.0000 mg | ORAL_TABLET | Freq: Four times a day (QID) | ORAL | Status: DC | PRN

## 2014-08-05 MED ORDER — METOCLOPRAMIDE HCL 5 MG PO TABS: 10.0000 mg | ORAL_TABLET | Freq: Three times a day (TID) | ORAL | Status: DC

## 2014-08-05 NOTE — Telephone Encounter (Signed)
Spoke with pt and she is aware. New scripts sent to pharmacy. Pt states that she spoke with her psychiatrist about getting off of some of her meds and that her psychiatrist may be calling to speak with Dr. Olevia Perches about her meds. Dr. Olevia Perches aware.

## 2014-08-05 NOTE — Telephone Encounter (Signed)
OK 

## 2014-09-18 ENCOUNTER — Other Ambulatory Visit: Payer: Self-pay | Admitting: Physician Assistant

## 2014-09-20 ENCOUNTER — Telehealth: Payer: Self-pay | Admitting: Internal Medicine

## 2014-09-21 NOTE — Telephone Encounter (Signed)
Left message on 09/21/14 at 10:05 am [phone]) for the patient to call me back regarding medications that want to get filled.

## 2014-09-21 NOTE — Telephone Encounter (Signed)
Pt said she was returning your call and said it has been taking care of

## 2014-10-07 ENCOUNTER — Other Ambulatory Visit: Payer: Self-pay | Admitting: Internal Medicine

## 2014-10-07 NOTE — Telephone Encounter (Signed)
Sent Rx metoclopramide (REGLAN), 5 mg, #180 with no refills to [facility].

## 2014-10-11 ENCOUNTER — Telehealth: Payer: Self-pay | Admitting: Internal Medicine

## 2014-10-11 NOTE — Telephone Encounter (Signed)
A user error has taken place.

## 2014-12-16 ENCOUNTER — Other Ambulatory Visit: Payer: Self-pay | Admitting: Internal Medicine

## 2014-12-21 ENCOUNTER — Telehealth: Payer: Self-pay

## 2014-12-21 MED ORDER — METOCLOPRAMIDE HCL 5 MG PO TABS: ORAL_TABLET | ORAL | Status: DC

## 2014-12-21 NOTE — Telephone Encounter (Signed)
Refilled Reglan

## 2014-12-24 NOTE — Telephone Encounter (Signed)
Refilled Reglan

## 2015-04-13 ENCOUNTER — Ambulatory Visit
Admission: RE | Admit: 2015-04-13 | Discharge: 2015-04-13 | Disposition: A | Discharge status: Home / Self Care | Payer: Medicare Other | Source: Ambulatory Visit | Attending: Physical Medicine and Rehabilitation | Admitting: Physical Medicine and Rehabilitation

## 2015-04-13 ENCOUNTER — Other Ambulatory Visit: Payer: Self-pay | Admitting: Neurosurgery

## 2015-04-13 ENCOUNTER — Other Ambulatory Visit: Payer: Self-pay | Admitting: Physical Medicine and Rehabilitation

## 2015-04-13 ENCOUNTER — Ambulatory Visit
Admission: RE | Admit: 2015-04-13 | Discharge: 2015-04-13 | Disposition: A | Discharge status: Home / Self Care | Payer: Medicare Other | Source: Ambulatory Visit | Attending: Neurosurgery | Admitting: Neurosurgery

## 2015-04-13 DIAGNOSIS — M179 Osteoarthritis of knee, unspecified: Secondary | ICD-10-CM

## 2015-04-13 DIAGNOSIS — M4692 Unspecified inflammatory spondylopathy, cervical region: Secondary | ICD-10-CM

## 2015-04-13 DIAGNOSIS — M1710 Unilateral primary osteoarthritis, unspecified knee: Secondary | ICD-10-CM

## 2015-10-20 ENCOUNTER — Other Ambulatory Visit: Payer: Self-pay | Admitting: Internal Medicine

## 2015-10-20 DIAGNOSIS — N63 Unspecified lump in unspecified breast: Secondary | ICD-10-CM

## 2015-11-04 ENCOUNTER — Other Ambulatory Visit: Payer: Medicare Other

## 2015-12-14 ENCOUNTER — Other Ambulatory Visit: Payer: Self-pay | Admitting: Neurosurgery

## 2015-12-14 DIAGNOSIS — M47816 Spondylosis without myelopathy or radiculopathy, lumbar region: Secondary | ICD-10-CM

## 2015-12-21 ENCOUNTER — Encounter: Payer: Self-pay | Admitting: Gastroenterology

## 2015-12-24 ENCOUNTER — Other Ambulatory Visit: Payer: Medicare Other

## 2015-12-27 ENCOUNTER — Ambulatory Visit
Admission: RE | Admit: 2015-12-27 | Discharge: 2015-12-27 | Disposition: A | Discharge status: Home / Self Care | Payer: Medicare Other | Source: Ambulatory Visit | Attending: Neurosurgery | Admitting: Neurosurgery

## 2015-12-27 DIAGNOSIS — M47816 Spondylosis without myelopathy or radiculopathy, lumbar region: Secondary | ICD-10-CM

## 2016-01-24 ENCOUNTER — Other Ambulatory Visit: Payer: Medicare Other

## 2016-02-02 ENCOUNTER — Ambulatory Visit
Admission: RE | Admit: 2016-02-02 | Discharge: 2016-02-02 | Disposition: A | Discharge status: Home / Self Care | Payer: Medicare Other | Source: Ambulatory Visit | Attending: Internal Medicine | Admitting: Internal Medicine

## 2016-02-02 DIAGNOSIS — N63 Unspecified lump in unspecified breast: Secondary | ICD-10-CM

## 2016-04-23 ENCOUNTER — Ambulatory Visit (HOSPITAL_COMMUNITY)
Admission: RE | Admit: 2016-04-23 | Discharge: 2016-04-23 | Disposition: A | Discharge status: Home / Self Care | Payer: Medicare Other | Source: Ambulatory Visit | Attending: Psychiatry | Admitting: Psychiatry

## 2016-04-23 ENCOUNTER — Other Ambulatory Visit (HOSPITAL_COMMUNITY): Payer: Self-pay

## 2016-04-23 DIAGNOSIS — Z79899 Other long term (current) drug therapy: Secondary | ICD-10-CM | POA: Insufficient documentation

## 2016-04-23 DIAGNOSIS — F329 Major depressive disorder, single episode, unspecified: Secondary | ICD-10-CM | POA: Diagnosis present

## 2016-06-19 ENCOUNTER — Ambulatory Visit: Payer: Self-pay | Admitting: Allergy and Immunology

## 2016-09-11 NOTE — H&P (Signed)
TOTAL KNEE ADMISSION H&P  Patient is being admitted for left total knee arthroplasty.  Subjective:  Chief Complaint:left knee pain.  HPI: Christine Robles, 61 y.o. female, has a history of pain and functional disability in the left knee due to arthritis and has failed non-surgical conservative treatments for greater than 12 weeks to includeNSAID's and/or analgesics, corticosteriod injections, viscosupplementation injections, weight reduction as appropriate and activity modification.  Onset of symptoms was gradual, starting >10 years ago with gradually worsening course since that time. The patient noted no past surgery on the left knee(s).  Patient currently rates pain in the left knee(s) at 10 out of 10 with activity. Patient has night pain, worsening of pain with activity and weight bearing, pain that interferes with activities of daily living and joint swelling.  Patient has evidence of subchondral sclerosis and joint space narrowing by imaging studies. There is no active infection.  Patient Active Problem List   Diagnosis Date Noted  . Streptococcal sore throat 08/10/2012    Class: Acute  . HYPOTHYROIDISM 03/08/2009  . PANIC DISORDER 03/08/2009  . DEPRESSION 03/08/2009  . HYPERTENSION 03/08/2009  . GERD 03/08/2009  . SPINAL STENOSIS 03/08/2009  . IRRITABLE BOWEL SYNDROME, HX OF 03/08/2009   Past Medical History:  Diagnosis Date  . Anxiety   . Arthritis   . Candida esophagitis   . Chronic back pain   . Depression   . Esophagitis   . Gastroparesis   . GERD (gastroesophageal reflux disease)   . Hypertension   . Hypothyroidism   . IBS (irritable bowel syndrome)   . Panic disorder   . Spinal stenosis   . Thyroid disease    hypothyroidism  . Ventral hernia     Past Surgical History:  Procedure Laterality Date  . BACK SURGERY    . BREAST BIOPSY    . CARDIAC CATHETERIZATION  2012  . CHOLECYSTECTOMY    . HERNIA REPAIR  02/01/11   ventral hernia  . KNEE SURGERY     left  .  SPINAL FUSION    . TONSILLECTOMY    . TUBAL LIGATION      No prescriptions prior to admission.   Allergies  Allergen Reactions  . Escitalopram Oxalate Other (See Comments)    Stroke like symptoms. Pt thinks she may have been given too big a dose.  Marland Kitchen Buspirone Hcl Other (See Comments)    Abnormal behavior  . Fentanyl Other (See Comments)    Headache   . Morphine And Related Other (See Comments)    headache    Social History  Substance Use Topics  . Smoking status: Former Smoker    Packs/day: 1.00    Years: 15.00    Types: Cigarettes    Quit date: 08/08/1998  . Smokeless tobacco: Never Used  . Alcohol use No    Family History  Problem Relation Age of Onset  . Leukemia Mother   . Hypertension Father   . Heart disease Father   . Heart disease Brother     pacemaker  . Cirrhosis Father   . Colon cancer Neg Hx   . Heart disease Maternal Grandmother   . Crohn's disease Father   . Diabetes Brother   . Diabetes      grandfather     Review of Systems  Constitutional: Negative.   HENT: Negative.   Eyes: Negative.   Respiratory: Negative.   Cardiovascular: Negative.   Gastrointestinal: Negative.   Genitourinary: Negative.   Musculoskeletal: Positive for back  pain, joint pain and myalgias.  Skin: Negative.   Neurological: Negative.   Endo/Heme/Allergies: Negative.   Psychiatric/Behavioral: Negative.     Objective:  Physical Exam  Constitutional: She is oriented to person, place, and time. She appears well-developed and well-nourished.  HENT:  Head: Normocephalic and atraumatic.  Eyes: EOM are normal. Pupils are equal, round, and reactive to light.  Neck: Normal range of motion. Neck supple.  Cardiovascular: Normal rate and regular rhythm.   Respiratory: Effort normal and breath sounds normal.  GI: Soft. Bowel sounds are normal.  Musculoskeletal:  Examination of her left knee reveals 2+ effusion.  Range of motion 0-95 degrees.  Ligaments are stable.  Medial and  lateral joint line tenderness.  She is neurovascularly intact distally.    Neurological: She is alert and oriented to person, place, and time.  Skin: Skin is warm and dry.  Psychiatric: She has a normal mood and affect. Her behavior is normal. Judgment and thought content normal.    Vital signs in last 24 hours:    Labs:   Estimated body mass index is 39.94 kg/m as calculated from the following:   Height as of 07/27/14: 5\' 7"  (1.702 m).   Weight as of 07/27/14: 115.7 kg (255 lb).   Imaging Review Plain radiographs demonstrate severe degenerative joint disease of the left knee(s). The overall alignment ismild varus. The bone quality appears to be fair for age and reported activity level.  Assessment/Plan:  End stage arthritis, left knee   The patient history, physical examination, clinical judgment of the provider and imaging studies are consistent with end stage degenerative joint disease of the left knee(s) and total knee arthroplasty is deemed medically necessary. The treatment options including medical management, injection therapy arthroscopy and arthroplasty were discussed at length. The risks and benefits of total knee arthroplasty were presented and reviewed. The risks due to aseptic loosening, infection, stiffness, patella tracking problems, thromboembolic complications and other imponderables were discussed. The patient acknowledged the explanation, agreed to proceed with the plan and consent was signed. Patient is being admitted for inpatient treatment for surgery, pain control, PT, OT, prophylactic antibiotics, VTE prophylaxis, progressive ambulation and ADL's and discharge planning. The patient is planning to be discharged home with home health services

## 2016-09-14 ENCOUNTER — Ambulatory Visit (HOSPITAL_COMMUNITY)
Admission: RE | Admit: 2016-09-14 | Discharge: 2016-09-14 | Disposition: A | Discharge status: Home / Self Care | Payer: Medicare Other | Source: Ambulatory Visit | Attending: Orthopedic Surgery | Admitting: Orthopedic Surgery

## 2016-09-14 ENCOUNTER — Encounter (HOSPITAL_COMMUNITY): Payer: Self-pay

## 2016-09-14 DIAGNOSIS — Z01818 Encounter for other preprocedural examination: Secondary | ICD-10-CM | POA: Insufficient documentation

## 2016-09-14 DIAGNOSIS — M1712 Unilateral primary osteoarthritis, left knee: Secondary | ICD-10-CM | POA: Insufficient documentation

## 2016-09-14 HISTORY — DX: Headache: R51

## 2016-09-14 HISTORY — DX: Dyspnea, unspecified: R06.00

## 2016-09-14 HISTORY — DX: Sleep apnea, unspecified: G47.30

## 2016-09-14 HISTORY — DX: Headache, unspecified: R51.9

## 2016-09-14 LAB — CBC
HCT: 42 % (ref 36.0–46.0)
HEMOGLOBIN: 14 g/dL (ref 12.0–15.0)
MCH: 27.6 pg (ref 26.0–34.0)
MCHC: 33.3 g/dL (ref 30.0–36.0)
MCV: 82.8 fL (ref 78.0–100.0)
Platelets: 368 10*3/uL (ref 150–400)
RBC: 5.07 MIL/uL (ref 3.87–5.11)
RDW: 15.6 % — ABNORMAL HIGH (ref 11.5–15.5)
WBC: 6.5 10*3/uL (ref 4.0–10.5)

## 2016-09-14 LAB — TYPE AND SCREEN
ABO/RH(D): B POS
Antibody Screen: NEGATIVE

## 2016-09-14 LAB — COMPREHENSIVE METABOLIC PANEL
ALBUMIN: 3.8 g/dL (ref 3.5–5.0)
ALT: 21 U/L (ref 14–54)
ANION GAP: 9 (ref 5–15)
AST: 17 U/L (ref 15–41)
Alkaline Phosphatase: 67 U/L (ref 38–126)
BUN: 12 mg/dL (ref 6–20)
CHLORIDE: 104 mmol/L (ref 101–111)
CO2: 24 mmol/L (ref 22–32)
Calcium: 9.6 mg/dL (ref 8.9–10.3)
Creatinine, Ser: 0.9 mg/dL (ref 0.44–1.00)
GFR calc non Af Amer: >60 mL/min (ref >60)
Glucose, Bld: 105 mg/dL — ABNORMAL HIGH (ref 65–99)
Potassium: 3.8 mmol/L (ref 3.5–5.1)
SODIUM: 137 mmol/L (ref 135–145)
Total Bilirubin: 0.4 mg/dL (ref 0.3–1.2)
Total Protein: 6.7 g/dL (ref 6.5–8.1)

## 2016-09-14 LAB — SURGICAL PCR SCREEN
MRSA, PCR: NEGATIVE
Staphylococcus aureus: NEGATIVE

## 2016-09-14 NOTE — Pre-Procedure Instructions (Signed)
SHANECIA HOGANSON  09/14/2016      [facility] - [address] AT [facility] [address] [place] [address] Phone: [phone] Fax: [phone]    Your procedure is scheduled on  April 25.  Report to [facility] Admitting at 745 A.M.  Call this number if you have problems the morning of surgery:  [phone]   Remember:  Do not eat food or drink liquids after midnight.  Take these medicines the morning of surgery with A SIP OF WATER tylenol if needed, Alprazolam (Xanax), Lamotrigine (Lamictal), levothyroxine (Synthroid), Omeprazole (Prilosec), oxycodone, Zanflex if needed, Phenergan if needed  Stop taking aspirin, BC's, Goody's, Herbal medications, Fish Oil, Vitamins, Ibuprofen, advil, Motrin, Aleve, Mobic (Meloxicam)  Do not wear jewelry, make-up or nail polish.  Do not wear lotions, powders, or perfumes, or deoderant.  Do not shave 48 hours prior to surgery.  Men may shave face and neck.  Do not bring valuables to the hospital.  [facility] is not responsible for any belongings or valuables.  Contacts, dentures or bridgework may not be worn into surgery.  Leave your suitcase in the car.  After surgery it may be brought to your room.  For patients admitted to the hospital, discharge time will be determined by your treatment team.  Patients discharged the day of surgery will not be allowed to drive home.    Special instructions:  [facility] - Preparing for Surgery  Before surgery, you can play an important role.  Because skin is not sterile, your skin needs to be as free of germs as possible.  You can reduce the number of germs on you skin by washing with CHG (chlorahexidine gluconate) soap before surgery.  CHG is an antiseptic cleaner which kills germs and bonds with the skin to continue killing germs even after washing.  Please DO NOT use if you have an allergy to CHG or antibacterial soaps.  If your  skin becomes reddened/irritated stop using the CHG and inform your nurse when you arrive at Short Stay.  Do not shave (including legs and underarms) for at least 48 hours prior to the first CHG shower.  You may shave your face.  Please follow these instructions carefully:   1.  Shower with CHG Soap the night before surgery and the    morning of Surgery.  2.  If you choose to wash your hair, wash your hair first as usual with your   normal shampoo.  3.  After you shampoo, rinse your hair and body thoroughly to remove the   Shampoo.  4.  Use CHG as you would any other liquid soap.  You can apply chg directly to the skin and wash gently with scrungie or a clean washcloth.  5.  Apply the CHG Soap to your body ONLY FROM THE NECK DOWN.   Do not use on open wounds or open sores.  Avoid contact with your eyes,       ears, mouth and genitals (private parts).  Wash genitals (private parts)   with your normal soap.  6.  Wash thoroughly, paying special attention to the area where your surgery  will be performed.  7.  Thoroughly rinse your body with warm water from the neck down.  8.  DO NOT shower/wash with your normal soap after using and rinsing off   the CHG Soap.  9.  Pat yourself dry  with a clean towel.            10.  Wear clean pajamas.            11.  Place clean sheets on your bed the night of your first shower and do not  sleep with pets.  Day of Surgery  Do not apply any lotions/deoderants the morning of surgery.  Please wear clean clothes to the hospital/surgery center.     Please read over the following fact sheets that you were given. Pain Booklet, Coughing and Deep Breathing, MRSA Information and Surgical Site Infection Prevention

## 2016-09-14 NOTE — Progress Notes (Addendum)
PCP Dr. Seward Carol Dr Berna Bue is her rheumatologist  States she saw Dr Fidela Juneau many years ago because of chest pain, but all testing was normal, was due to stress per pt. Card cath noted from 2012 States she had a stress test and echo in 2012 also Denies any chest pain recently. Denies a fever or cough. Instructed Christine Robles to bring in her CPAP mask on the day of surgery, voices understanding.

## 2016-09-25 MED ORDER — DEXTROSE 5 % IV SOLN
3.0000 g | INTRAVENOUS | Status: AC
  Administered 2016-09-26: 3 g via INTRAVENOUS
  Filled 2016-09-25: qty 3000

## 2016-09-25 MED ORDER — TRANEXAMIC ACID 1000 MG/10ML IV SOLN
1000.0000 mg | INTRAVENOUS | Status: AC
  Administered 2016-09-26: 1000 mg via INTRAVENOUS
  Filled 2016-09-25: qty 10

## 2016-09-25 MED ORDER — LACTATED RINGERS IV SOLN
INTRAVENOUS | Status: DC
  Administered 2016-09-26 (×3): via INTRAVENOUS

## 2016-09-25 NOTE — Anesthesia Preprocedure Evaluation (Addendum)
Anesthesia Evaluation    Reviewed: Allergy & Precautions, Patient's Chart, lab work & pertinent test results  Airway Mallampati: III  TM Distance: >3 FB Neck ROM: Full    Dental  (+) Teeth Intact, Dental Advisory Given, Missing, Caps, Partial Upper   Pulmonary sleep apnea and Continuous Positive Airway Pressure Ventilation , former smoker,    Pulmonary exam normal breath sounds clear to auscultation       Cardiovascular hypertension, Pt. on medications Normal cardiovascular exam Rhythm:Regular Rate:Normal     Neuro/Psych  Headaches, PSYCHIATRIC DISORDERS Anxiety Depression    GI/Hepatic Neg liver ROS, GERD  Medicated,  Endo/Other  Hypothyroidism Obesity   Renal/GU negative Renal ROS     Musculoskeletal  (+) Arthritis , Osteoarthritis,    Abdominal   Peds  Hematology negative hematology ROS (+) Plt 368k   Anesthesia Other Findings Day of surgery medications reviewed with the patient.  Reproductive/Obstetrics                            Anesthesia Physical Anesthesia Plan  ASA: II  Anesthesia Plan: Spinal   Post-op Pain Management:  Regional for Post-op pain   Induction: Intravenous  Airway Management Planned: Simple Face Mask  Additional Equipment:   Intra-op Plan:   Post-operative Plan:   Informed Consent: I have reviewed the patients History and Physical, chart, labs and discussed the procedure including the risks, benefits and alternatives for the proposed anesthesia with the patient or authorized representative who has indicated his/her understanding and acceptance.   Dental advisory given  Plan Discussed with: CRNA, Anesthesiologist and Surgeon  Anesthesia Plan Comments: (Discussed risks and benefits of and differences between spinal and general. Discussed risks of spinal including headache, backache, failure, bleeding, infection, and nerve damage. Patient consents to spinal.  Questions answered. Coagulation studies and platelet count acceptable.)        Anesthesia Quick Evaluation

## 2016-09-26 ENCOUNTER — Encounter (HOSPITAL_COMMUNITY)
Admission: RE | Disposition: A | Discharge status: Home Health | Payer: Self-pay | Source: Ambulatory Visit | Attending: Orthopedic Surgery

## 2016-09-26 ENCOUNTER — Encounter (HOSPITAL_COMMUNITY): Payer: Self-pay

## 2016-09-26 ENCOUNTER — Inpatient Hospital Stay (HOSPITAL_COMMUNITY): Payer: Medicare Other

## 2016-09-26 ENCOUNTER — Inpatient Hospital Stay (HOSPITAL_COMMUNITY): Payer: Medicare Other | Admitting: Anesthesiology

## 2016-09-26 ENCOUNTER — Inpatient Hospital Stay (HOSPITAL_COMMUNITY)
Admission: RE | Admit: 2016-09-26 | Discharge: 2016-09-27 | DRG: 470 | Disposition: A | Discharge status: Home Health | Payer: Medicare Other | Source: Ambulatory Visit | Attending: Orthopedic Surgery | Admitting: Orthopedic Surgery

## 2016-09-26 DIAGNOSIS — K219 Gastro-esophageal reflux disease without esophagitis: Secondary | ICD-10-CM | POA: Diagnosis present

## 2016-09-26 DIAGNOSIS — M069 Rheumatoid arthritis, unspecified: Secondary | ICD-10-CM | POA: Diagnosis present

## 2016-09-26 DIAGNOSIS — Z888 Allergy status to other drugs, medicaments and biological substances status: Secondary | ICD-10-CM

## 2016-09-26 DIAGNOSIS — Z806 Family history of leukemia: Secondary | ICD-10-CM

## 2016-09-26 DIAGNOSIS — Z96659 Presence of unspecified artificial knee joint: Secondary | ICD-10-CM

## 2016-09-26 DIAGNOSIS — Z981 Arthrodesis status: Secondary | ICD-10-CM | POA: Diagnosis not present

## 2016-09-26 DIAGNOSIS — E039 Hypothyroidism, unspecified: Secondary | ICD-10-CM | POA: Diagnosis present

## 2016-09-26 DIAGNOSIS — Z6839 Body mass index (BMI) 39.0-39.9, adult: Secondary | ICD-10-CM

## 2016-09-26 DIAGNOSIS — M1712 Unilateral primary osteoarthritis, left knee: Principal | ICD-10-CM | POA: Diagnosis present

## 2016-09-26 DIAGNOSIS — G4733 Obstructive sleep apnea (adult) (pediatric): Secondary | ICD-10-CM | POA: Diagnosis present

## 2016-09-26 DIAGNOSIS — Z87891 Personal history of nicotine dependence: Secondary | ICD-10-CM | POA: Diagnosis not present

## 2016-09-26 DIAGNOSIS — Z885 Allergy status to narcotic agent status: Secondary | ICD-10-CM

## 2016-09-26 DIAGNOSIS — E669 Obesity, unspecified: Secondary | ICD-10-CM | POA: Diagnosis present

## 2016-09-26 DIAGNOSIS — Z8249 Family history of ischemic heart disease and other diseases of the circulatory system: Secondary | ICD-10-CM | POA: Diagnosis not present

## 2016-09-26 DIAGNOSIS — Z833 Family history of diabetes mellitus: Secondary | ICD-10-CM

## 2016-09-26 DIAGNOSIS — I10 Essential (primary) hypertension: Secondary | ICD-10-CM | POA: Diagnosis present

## 2016-09-26 DIAGNOSIS — G4730 Sleep apnea, unspecified: Secondary | ICD-10-CM | POA: Diagnosis present

## 2016-09-26 DIAGNOSIS — F410 Panic disorder [episodic paroxysmal anxiety] without agoraphobia: Secondary | ICD-10-CM | POA: Diagnosis present

## 2016-09-26 HISTORY — DX: Dependence on other enabling machines and devices: Z99.89

## 2016-09-26 HISTORY — DX: Low back pain, unspecified: M54.50

## 2016-09-26 HISTORY — DX: Low back pain: M54.5

## 2016-09-26 HISTORY — PX: TOTAL KNEE ARTHROPLASTY: SHX125

## 2016-09-26 HISTORY — DX: Other chronic pain: G89.29

## 2016-09-26 HISTORY — DX: Migraine, unspecified, not intractable, without status migrainosus: G43.909

## 2016-09-26 HISTORY — DX: Rheumatoid arthritis, unspecified: M06.9

## 2016-09-26 HISTORY — DX: Bipolar disorder, unspecified: F31.9

## 2016-09-26 HISTORY — DX: Attention-deficit hyperactivity disorder, unspecified type: F90.9

## 2016-09-26 HISTORY — DX: Obstructive sleep apnea (adult) (pediatric): G47.33

## 2016-09-26 SURGERY — ARTHROPLASTY, KNEE, TOTAL
Anesthesia: Spinal | Site: Knee | Laterality: Left

## 2016-09-26 MED ORDER — SODIUM CHLORIDE 0.9 % IR SOLN
Status: DC | PRN
  Administered 2016-09-26: 3000 mL

## 2016-09-26 MED ORDER — MENTHOL 3 MG MT LOZG: 1.0000 | LOZENGE | OROMUCOSAL | Status: DC | PRN

## 2016-09-26 MED ORDER — LAMOTRIGINE 100 MG PO TABS
100.0000 mg | ORAL_TABLET | Freq: Every day | ORAL | Status: DC
  Administered 2016-09-26 – 2016-09-27 (×2): 100 mg via ORAL
  Filled 2016-09-26 (×3): qty 1

## 2016-09-26 MED ORDER — PROPOFOL 10 MG/ML IV BOLUS
INTRAVENOUS | Status: AC
  Filled 2016-09-26: qty 20

## 2016-09-26 MED ORDER — PROMETHAZINE HCL 25 MG/ML IJ SOLN: 6.2500 mg | INTRAMUSCULAR | Status: DC | PRN

## 2016-09-26 MED ORDER — DEXTROSE 5 % IV SOLN
INTRAVENOUS | Status: DC | PRN
  Administered 2016-09-26: 20 ug/min via INTRAVENOUS

## 2016-09-26 MED ORDER — BUPIVACAINE HCL (PF) 0.5 % IJ SOLN
INTRAMUSCULAR | Status: DC | PRN
  Administered 2016-09-26: 3 mL via INTRATHECAL

## 2016-09-26 MED ORDER — BUPIVACAINE LIPOSOME 1.3 % IJ SUSP
20.0000 mL | INTRAMUSCULAR | Status: AC
  Administered 2016-09-26: 20 mL
  Filled 2016-09-26: qty 20

## 2016-09-26 MED ORDER — FENTANYL CITRATE (PF) 100 MCG/2ML IJ SOLN
INTRAMUSCULAR | Status: AC
  Administered 2016-09-26: 50 ug via INTRAVENOUS
  Filled 2016-09-26: qty 2

## 2016-09-26 MED ORDER — BUPIVACAINE-EPINEPHRINE (PF) 0.5% -1:200000 IJ SOLN
INTRAMUSCULAR | Status: DC | PRN
  Administered 2016-09-26: 30 mL via PERINEURAL

## 2016-09-26 MED ORDER — MIDAZOLAM HCL 2 MG/2ML IJ SOLN
INTRAMUSCULAR | Status: AC
  Filled 2016-09-26: qty 2

## 2016-09-26 MED ORDER — CHLORHEXIDINE GLUCONATE 4 % EX LIQD: 60.0000 mL | Freq: Once | CUTANEOUS | Status: DC

## 2016-09-26 MED ORDER — POTASSIUM CHLORIDE IN NACL 20-0.9 MEQ/L-% IV SOLN
INTRAVENOUS | Status: DC
  Administered 2016-09-26 – 2016-09-27 (×2): via INTRAVENOUS
  Filled 2016-09-26 (×2): qty 1000

## 2016-09-26 MED ORDER — ACETAMINOPHEN 325 MG PO TABS: 650.0000 mg | ORAL_TABLET | Freq: Four times a day (QID) | ORAL | Status: DC | PRN

## 2016-09-26 MED ORDER — PANTOPRAZOLE SODIUM 40 MG PO TBEC
40.0000 mg | DELAYED_RELEASE_TABLET | Freq: Two times a day (BID) | ORAL | Status: DC
  Administered 2016-09-26 – 2016-09-27 (×3): 40 mg via ORAL
  Filled 2016-09-26 (×3): qty 1

## 2016-09-26 MED ORDER — PHENYLEPHRINE 40 MCG/ML (10ML) SYRINGE FOR IV PUSH (FOR BLOOD PRESSURE SUPPORT)
PREFILLED_SYRINGE | INTRAVENOUS | Status: AC
  Filled 2016-09-26: qty 10

## 2016-09-26 MED ORDER — FENTANYL CITRATE (PF) 100 MCG/2ML IJ SOLN
100.0000 ug | Freq: Once | INTRAMUSCULAR | Status: AC
  Administered 2016-09-26: 50 ug via INTRAVENOUS

## 2016-09-26 MED ORDER — METOCLOPRAMIDE HCL 5 MG/ML IJ SOLN: 5.0000 mg | Freq: Three times a day (TID) | INTRAMUSCULAR | Status: DC | PRN

## 2016-09-26 MED ORDER — CELECOXIB 200 MG PO CAPS
200.0000 mg | ORAL_CAPSULE | Freq: Two times a day (BID) | ORAL | Status: DC
  Administered 2016-09-26 – 2016-09-27 (×3): 200 mg via ORAL
  Filled 2016-09-26 (×3): qty 1

## 2016-09-26 MED ORDER — ONDANSETRON HCL 4 MG/2ML IJ SOLN
INTRAMUSCULAR | Status: DC | PRN
  Administered 2016-09-26: 4 mg via INTRAVENOUS

## 2016-09-26 MED ORDER — 0.9 % SODIUM CHLORIDE (POUR BTL) OPTIME
TOPICAL | Status: DC | PRN
  Administered 2016-09-26: 1000 mL

## 2016-09-26 MED ORDER — ONDANSETRON HCL 4 MG PO TABS: 4.0000 mg | ORAL_TABLET | Freq: Four times a day (QID) | ORAL | Status: DC | PRN

## 2016-09-26 MED ORDER — HYDROMORPHONE HCL 1 MG/ML IJ SOLN
0.5000 mg | INTRAMUSCULAR | Status: DC | PRN
  Administered 2016-09-26: 1 mg via INTRAVENOUS
  Filled 2016-09-26: qty 1

## 2016-09-26 MED ORDER — FENTANYL CITRATE (PF) 250 MCG/5ML IJ SOLN
INTRAMUSCULAR | Status: AC
  Filled 2016-09-26: qty 5

## 2016-09-26 MED ORDER — LEVOTHYROXINE SODIUM 100 MCG PO TABS
100.0000 ug | ORAL_TABLET | Freq: Every day | ORAL | Status: DC
  Administered 2016-09-27: 100 ug via ORAL
  Filled 2016-09-26: qty 1

## 2016-09-26 MED ORDER — BISACODYL 5 MG PO TBEC: 5.0000 mg | DELAYED_RELEASE_TABLET | Freq: Every day | ORAL | Status: DC | PRN

## 2016-09-26 MED ORDER — POLYETHYLENE GLYCOL 3350 17 G PO PACK: 17.0000 g | PACK | Freq: Every day | ORAL | Status: DC | PRN

## 2016-09-26 MED ORDER — CEFAZOLIN SODIUM-DEXTROSE 2-4 GM/100ML-% IV SOLN
2.0000 g | Freq: Four times a day (QID) | INTRAVENOUS | Status: AC
  Administered 2016-09-26 (×2): 2 g via INTRAVENOUS
  Filled 2016-09-26 (×2): qty 100

## 2016-09-26 MED ORDER — OXYCODONE HCL 5 MG PO TABS
5.0000 mg | ORAL_TABLET | ORAL | Status: DC | PRN
  Administered 2016-09-26 – 2016-09-27 (×7): 15 mg via ORAL
  Filled 2016-09-26 (×7): qty 3

## 2016-09-26 MED ORDER — ASPIRIN EC 325 MG PO TBEC: 325.0000 mg | DELAYED_RELEASE_TABLET | Freq: Every day | ORAL | 0 refills | Status: DC

## 2016-09-26 MED ORDER — FENTANYL CITRATE (PF) 100 MCG/2ML IJ SOLN
25.0000 ug | INTRAMUSCULAR | Status: DC | PRN
  Administered 2016-09-26 (×2): 50 ug via INTRAVENOUS

## 2016-09-26 MED ORDER — PHENOL 1.4 % MT LIQD
1.0000 | OROMUCOSAL | Status: DC | PRN
Start: 2016-09-26 — End: 2016-09-27

## 2016-09-26 MED ORDER — MECLIZINE HCL 25 MG PO TABS
25.0000 mg | ORAL_TABLET | Freq: Three times a day (TID) | ORAL | Status: DC
  Administered 2016-09-26: 25 mg via ORAL
  Filled 2016-09-26 (×4): qty 1

## 2016-09-26 MED ORDER — ACETAMINOPHEN 650 MG RE SUPP: 650.0000 mg | Freq: Four times a day (QID) | RECTAL | Status: DC | PRN

## 2016-09-26 MED ORDER — ASPIRIN EC 325 MG PO TBEC
325.0000 mg | DELAYED_RELEASE_TABLET | Freq: Every day | ORAL | Status: DC
  Administered 2016-09-27: 325 mg via ORAL
  Filled 2016-09-26: qty 1

## 2016-09-26 MED ORDER — ALPRAZOLAM 0.5 MG PO TABS
0.5000 mg | ORAL_TABLET | Freq: Three times a day (TID) | ORAL | Status: DC
  Administered 2016-09-26 – 2016-09-27 (×3): 0.5 mg via ORAL
  Filled 2016-09-26 (×3): qty 1

## 2016-09-26 MED ORDER — BUPIVACAINE HCL (PF) 0.5 % IJ SOLN
INTRAMUSCULAR | Status: AC
  Filled 2016-09-26: qty 30

## 2016-09-26 MED ORDER — OXYCODONE HCL 5 MG PO TABS
ORAL_TABLET | ORAL | Status: AC
  Administered 2016-09-26: 15 mg via ORAL
  Filled 2016-09-26: qty 3

## 2016-09-26 MED ORDER — TRIAMTERENE-HCTZ 37.5-25 MG PO TABS
1.0000 | ORAL_TABLET | Freq: Every day | ORAL | Status: DC
  Administered 2016-09-27: 1 via ORAL
  Filled 2016-09-26: qty 1

## 2016-09-26 MED ORDER — SODIUM CHLORIDE 0.9 % IJ SOLN
INTRAMUSCULAR | Status: DC | PRN
  Administered 2016-09-26 (×2): 10 mL

## 2016-09-26 MED ORDER — VILAZODONE HCL 40 MG PO TABS
40.0000 mg | ORAL_TABLET | Freq: Every day | ORAL | Status: DC
  Administered 2016-09-26 – 2016-09-27 (×2): 40 mg via ORAL
  Filled 2016-09-26 (×2): qty 1

## 2016-09-26 MED ORDER — MIDAZOLAM HCL 2 MG/2ML IJ SOLN
2.0000 mg | Freq: Once | INTRAMUSCULAR | Status: AC
  Administered 2016-09-26: 1 mg via INTRAVENOUS

## 2016-09-26 MED ORDER — ONDANSETRON HCL 4 MG/2ML IJ SOLN
4.0000 mg | Freq: Four times a day (QID) | INTRAMUSCULAR | Status: DC | PRN
  Administered 2016-09-26: 4 mg via INTRAVENOUS
  Filled 2016-09-26: qty 2

## 2016-09-26 MED ORDER — DOCUSATE SODIUM 100 MG PO CAPS
100.0000 mg | ORAL_CAPSULE | Freq: Two times a day (BID) | ORAL | Status: DC
  Administered 2016-09-26 – 2016-09-27 (×3): 100 mg via ORAL
  Filled 2016-09-26 (×3): qty 1

## 2016-09-26 MED ORDER — METOCLOPRAMIDE HCL 5 MG PO TABS: 5.0000 mg | ORAL_TABLET | Freq: Three times a day (TID) | ORAL | Status: DC | PRN

## 2016-09-26 MED ORDER — DIPHENHYDRAMINE HCL 12.5 MG/5ML PO ELIX: 12.5000 mg | ORAL_SOLUTION | ORAL | Status: DC | PRN

## 2016-09-26 MED ORDER — BUPIVACAINE HCL 0.5 % IJ SOLN: INTRAMUSCULAR | Status: DC | PRN

## 2016-09-26 MED ORDER — ONDANSETRON HCL 4 MG/2ML IJ SOLN
INTRAMUSCULAR | Status: AC
  Filled 2016-09-26: qty 2

## 2016-09-26 MED ORDER — PROPOFOL 500 MG/50ML IV EMUL
INTRAVENOUS | Status: DC | PRN
  Administered 2016-09-26: 100 ug/kg/min via INTRAVENOUS

## 2016-09-26 MED ORDER — MIDAZOLAM HCL 5 MG/5ML IJ SOLN
INTRAMUSCULAR | Status: DC | PRN
  Administered 2016-09-26: 2 mg via INTRAVENOUS

## 2016-09-26 MED ORDER — DEXAMETHASONE SODIUM PHOSPHATE 10 MG/ML IJ SOLN
10.0000 mg | Freq: Once | INTRAMUSCULAR | Status: AC
  Administered 2016-09-27: 10 mg via INTRAVENOUS
  Filled 2016-09-26: qty 1

## 2016-09-26 MED ORDER — PHENYLEPHRINE HCL 10 MG/ML IJ SOLN
INTRAMUSCULAR | Status: DC | PRN
  Administered 2016-09-26 (×3): 80 ug via INTRAVENOUS

## 2016-09-26 MED ORDER — PROPOFOL 10 MG/ML IV BOLUS
INTRAVENOUS | Status: DC | PRN
  Administered 2016-09-26 (×2): 20 mg via INTRAVENOUS

## 2016-09-26 SURGICAL SUPPLY — 66 items
APL SKNCLS STERI-STRIP NONHPOA (GAUZE/BANDAGES/DRESSINGS)
BANDAGE ACE 4X5 VEL STRL LF (GAUZE/BANDAGES/DRESSINGS) ×3 IMPLANT
BANDAGE ACE 6X5 VEL STRL LF (GAUZE/BANDAGES/DRESSINGS) ×3 IMPLANT
BANDAGE ESMARK 6X9 LF (GAUZE/BANDAGES/DRESSINGS) ×1 IMPLANT
BENZOIN TINCTURE PRP APPL 2/3 (GAUZE/BANDAGES/DRESSINGS) ×1 IMPLANT
BLADE SAG 18X100X1.27 (BLADE) ×6 IMPLANT
BNDG CMPR 9X6 STRL LF SNTH (GAUZE/BANDAGES/DRESSINGS) ×1
BNDG ESMARK 6X9 LF (GAUZE/BANDAGES/DRESSINGS) ×3
BOWL SMART MIX CTS (DISPOSABLE) ×3 IMPLANT
CAPT KNEE TRIATH TK-4 ×2 IMPLANT
CLOSURE STERI-STRIP 1/2X4 (GAUZE/BANDAGES/DRESSINGS) ×1
CLOSURE WOUND 1/2 X4 (GAUZE/BANDAGES/DRESSINGS) ×2
CLSR STERI-STRIP ANTIMIC 1/2X4 (GAUZE/BANDAGES/DRESSINGS) ×1 IMPLANT
COVER SURGICAL LIGHT HANDLE (MISCELLANEOUS) ×3 IMPLANT
CUFF TOURNIQUET SINGLE 34IN LL (TOURNIQUET CUFF) ×3 IMPLANT
DRAPE EXTREMITY T 121X128X90 (DRAPE) ×3 IMPLANT
DRAPE HALF SHEET 40X57 (DRAPES) ×2 IMPLANT
DRAPE IMP U-DRAPE 54X76 (DRAPES) ×3 IMPLANT
DRAPE U-SHAPE 47X51 STRL (DRAPES) ×3 IMPLANT
DRSG AQUACEL AG ADV 3.5X10 (GAUZE/BANDAGES/DRESSINGS) ×3 IMPLANT
DURAPREP 26ML APPLICATOR (WOUND CARE) ×4 IMPLANT
ELECT CAUTERY BLADE 6.4 (BLADE) ×3 IMPLANT
ELECT REM PT RETURN 9FT ADLT (ELECTROSURGICAL) ×3
ELECTRODE REM PT RTRN 9FT ADLT (ELECTROSURGICAL) ×1 IMPLANT
EVACUATOR 1/8 PVC DRAIN (DRAIN) IMPLANT
FACESHIELD WRAPAROUND (MASK) ×6 IMPLANT
GLOVE BIOGEL PI IND STRL 7.0 (GLOVE) ×1 IMPLANT
GLOVE BIOGEL PI INDICATOR 7.0 (GLOVE) ×2
GLOVE ORTHO TXT STRL SZ7.5 (GLOVE) ×3 IMPLANT
GLOVE SURG ORTHO 7.0 STRL STRW (GLOVE) ×3 IMPLANT
GOWN STRL REUS W/ TWL LRG LVL3 (GOWN DISPOSABLE) ×2 IMPLANT
GOWN STRL REUS W/ TWL XL LVL3 (GOWN DISPOSABLE) ×1 IMPLANT
GOWN STRL REUS W/TWL LRG LVL3 (GOWN DISPOSABLE) ×6
GOWN STRL REUS W/TWL XL LVL3 (GOWN DISPOSABLE) ×3
HANDPIECE INTERPULSE COAX TIP (DISPOSABLE) ×3
IMMOBILIZER KNEE 22 UNIV (SOFTGOODS) ×3 IMPLANT
IMMOBILIZER KNEE 24 THIGH 36 (MISCELLANEOUS) IMPLANT
IMMOBILIZER KNEE 24 UNIV (MISCELLANEOUS)
KIT BASIN OR (CUSTOM PROCEDURE TRAY) ×3 IMPLANT
KIT ROOM TURNOVER OR (KITS) ×3 IMPLANT
MANIFOLD NEPTUNE II (INSTRUMENTS) ×3 IMPLANT
NDL 18GX1X1/2 (RX/OR ONLY) (NEEDLE) ×1 IMPLANT
NDL HYPO 25GX1X1/2 BEV (NEEDLE) ×1 IMPLANT
NEEDLE 18GX1X1/2 (RX/OR ONLY) (NEEDLE) ×3 IMPLANT
NEEDLE HYPO 25GX1X1/2 BEV (NEEDLE) ×3 IMPLANT
NS IRRIG 1000ML POUR BTL (IV SOLUTION) ×3 IMPLANT
PACK TOTAL JOINT (CUSTOM PROCEDURE TRAY) ×3 IMPLANT
PACK UNIVERSAL I (CUSTOM PROCEDURE TRAY) ×3 IMPLANT
PAD ARMBOARD 7.5X6 YLW CONV (MISCELLANEOUS) ×6 IMPLANT
SET HNDPC FAN SPRY TIP SCT (DISPOSABLE) ×1 IMPLANT
STRIP CLOSURE SKIN 1/2X4 (GAUZE/BANDAGES/DRESSINGS) ×4 IMPLANT
SUCTION FRAZIER HANDLE 10FR (MISCELLANEOUS) ×2
SUCTION TUBE FRAZIER 10FR DISP (MISCELLANEOUS) ×1 IMPLANT
SUT MNCRL AB 4-0 PS2 18 (SUTURE) ×3 IMPLANT
SUT VIC AB 0 CT1 27 (SUTURE)
SUT VIC AB 0 CT1 27XBRD ANBCTR (SUTURE) IMPLANT
SUT VIC AB 1 CTX 36 (SUTURE) ×3
SUT VIC AB 1 CTX36XBRD ANBCTR (SUTURE) ×1 IMPLANT
SUT VIC AB 2-0 CT1 27 (SUTURE) ×6
SUT VIC AB 2-0 CT1 TAPERPNT 27 (SUTURE) ×2 IMPLANT
SYR 50ML LL SCALE MARK (SYRINGE) ×3 IMPLANT
SYR CONTROL 10ML LL (SYRINGE) ×3 IMPLANT
TOWEL OR 17X24 6PK STRL BLUE (TOWEL DISPOSABLE) ×3 IMPLANT
TOWEL OR 17X26 10 PK STRL BLUE (TOWEL DISPOSABLE) ×3 IMPLANT
TRAY CATH 16FR W/PLASTIC CATH (SET/KITS/TRAYS/PACK) IMPLANT
WATER STERILE IRR 1000ML POUR (IV SOLUTION) ×1 IMPLANT

## 2016-09-26 NOTE — Discharge Summary (Addendum)
Patient ID: Christine Robles MRN: [id number] DOB/AGE: Oct 16, 1955 61 y.o.  Admit date: 09/26/2016 Discharge date: 09/27/2016  Admission Diagnoses:  Principal Problem:   Primary localized osteoarthritis of left knee Active Problems:   Hypothyroidism   PANIC DISORDER   Essential hypertension   GERD   Discharge Diagnoses:  Same  Past Medical History:  Diagnosis Date  . ADHD (attention deficit hyperactivity disorder)    "vs Bipolar" (09/26/2016)  . Anxiety   . Bipolar disorder ([place])    "vs ADHD" (09/26/2016)  . Candida esophagitis ([place])   . Chronic lower back pain   . Depression   . Dyspnea   . Esophagitis   . Gastroparesis   . GERD (gastroesophageal reflux disease)   . Headache    "q 4 months now" (09/26/2016)  . Hypertension   . Hypothyroidism   . IBS (irritable bowel syndrome)   . Migraine    "maybe 1-2/year" (09/26/2016)  . OSA on CPAP   . Panic disorder   . Rheumatoid arthritis ([place])    "all over" (09/26/2016)  . Spinal stenosis   . Thyroid disease    hypothyroidism    Surgeries: Procedure(s): LEFT TOTAL KNEE ARTHROPLASTY on 09/26/2016   Consultants:   Discharged Condition: Improved  Hospital Course: Christine Robles is an 61 y.o. female who was admitted 09/26/2016 for operative treatment ofPrimary localized osteoarthritis of left knee. Patient has severe unremitting pain that affects sleep, daily activities, and work/hobbies. After pre-op clearance the patient was taken to the operating room on 09/26/2016 and underwent  Procedure(s): LEFT TOTAL KNEE ARTHROPLASTY.    Patient was given perioperative antibiotics:  Anti-infectives    Start     Dose/Rate Route Frequency Ordered Stop   09/26/16 1500  ceFAZolin (ANCEF) IVPB 2g/100 mL premix     2 g 200 mL/hr over 30 Minutes Intravenous Every 6 hours 09/26/16 1415 09/26/16 2145   09/26/16 0830  ceFAZolin (ANCEF) 3 g in dextrose 5 % 50 mL IVPB     3 g 130 mL/hr over 30 Minutes Intravenous To ShortStay Surgical 09/25/16  [id number] 09/26/16 0913       Patient was given sequential compression devices, early ambulation, and chemoprophylaxis to prevent DVT.  Patient benefited maximally from hospital stay and there were no complications.    Recent vital signs:  Patient Vitals for the past 24 hrs:  BP Temp Temp src Pulse Resp SpO2  09/27/16 0622 131/70 98 F (36.7 C) Oral 72 16 95 %  09/27/16 0030 127/63 98 F (36.7 C) Oral 79 16 92 %  09/26/16 1930 130/71 98.8 F (37.1 C) Oral 71 16 91 %  09/26/16 1415 134/77 98.7 F (37.1 C) Oral 67 16 96 %  09/26/16 1343 (!) 148/75 - - 62 12 97 %  09/26/16 1328 (!) 141/87 - - 65 14 98 %  09/26/16 1313 (!) 150/77 - - (!) 58 10 100 %  09/26/16 1243 (!) 146/92 - - 62 18 99 %  09/26/16 1228 138/87 - - 63 11 100 %  09/26/16 1213 132/79 - - 70 15 100 %  09/26/16 1158 128/79 - - (!) 57 14 94 %  09/26/16 1143 125/75 - - (!) 55 11 91 %  09/26/16 1128 117/72 - - (!) 55 (!) 9 95 %  09/26/16 1113 118/67 - - 60 12 90 %  09/26/16 1058 123/69 - - 61 10 96 %  09/26/16 1043 106/66 - - 66 13 95 %  09/26/16 1028 (!) 103/54 97.4  F (36.3 C) - 72 17 97 %     Recent laboratory studies:   Recent Labs  09/27/16 0705  WBC 5.8  HGB 12.0  HCT 36.7  PLT 279  NA 137  K 3.6  CL 104  CO2 27  BUN 12  CREATININE 1.06*  GLUCOSE 119*  CALCIUM 8.4*     Discharge Medications:   Allergies as of 09/27/2016      Reactions   Escitalopram Oxalate Other (See Comments)   Stroke like symptoms. Pt thinks she may have been given too big a dose.   Buspirone Hcl Other (See Comments)   Abnormal behavior   Fentanyl Other (See Comments)   Headache    Morphine And Related Other (See Comments)   headache      Medication List    STOP taking these medications   acetaminophen 500 MG tablet Commonly known as:  TYLENOL   diclofenac sodium 1 % Gel Commonly known as:  VOLTAREN   ibuprofen 200 MG tablet Commonly known as:  ADVIL,MOTRIN   meloxicam 15 MG tablet Commonly known as:  MOBIC    meloxicam 7.5 MG tablet Commonly known as:  MOBIC     TAKE these medications   ALPRAZolam 1 MG tablet Commonly known as:  XANAX Take 0.5 mg by mouth 3 (three) times daily.   aspirin EC 325 MG tablet Take 1 tablet (325 mg total) by mouth daily. 1 tab a day for the next 30 days to prevent blood clots   benztropine 0.5 MG tablet Commonly known as:  COGENTIN Take 1 tablet (0.5 mg total) by mouth 2 (two) times daily as needed.   ENBREL SURECLICK 50 MG/ML injection Generic drug:  etanercept Inject 50 mg into the skin once a week. Mondays   folic acid 1 MG tablet Commonly known as:  FOLVITE Take 1 mg by mouth 3 (three) times daily.   lamoTRIgine 100 MG tablet Commonly known as:  LAMICTAL Take 100 mg by mouth daily.   levothyroxine 100 MCG tablet Commonly known as:  SYNTHROID, LEVOTHROID Take 1 tablet (100 mcg total) by mouth daily. Take before breakfast with no other medications   lisdexamfetamine 70 MG capsule Commonly known as:  VYVANSE Take 70 mg by mouth daily.   meclizine 25 MG tablet Commonly known as:  ANTIVERT TAKE 1 TABLET 3 TIMES DAILY AS NEEDED FOR DIZZINESS   Methotrexate (PF) 20 MG/0.4ML Soaj Inject 0.8 mLs into the skin every 7 (seven) days. SUNDAYS   metoCLOPramide 5 MG tablet Commonly known as:  REGLAN TAKE 2 TABLETS BY MOUTH THREE TIMES DAILY BEFORE A MEAL   metoCLOPramide 5 MG tablet Commonly known as:  REGLAN TAKE 2 TABLETS BY MOUTH THREE TIMES DAILY BEFORE A MEAL   multivitamin capsule Take 1 capsule by mouth daily.   omeprazole 20 MG capsule Commonly known as:  PRILOSEC Take 20-40 mg by mouth daily as needed (acid reflux). 1-2 CAPSULES DEPENDING ON ACID REFLUX   Oxycodone HCl 10 MG Tabs TAKE 1 TABLET 5 TIMES DAILY   pantoprazole 40 MG tablet Commonly known as:  PROTONIX TAKE 1 TABLET BY MOUTH TWICE DAILY   promethazine 25 MG tablet Commonly known as:  PHENERGAN Take 1 tablet (25 mg total) by mouth every 6 (six) hours as needed for  nausea or vomiting.   ramelteon 8 MG tablet Commonly known as:  ROZEREM Take 8 mg by mouth at bedtime.   SYSTANE OP Apply 2 drops to eye daily as needed (dry eyes).  tiZANidine 2 MG tablet Commonly known as:  ZANAFLEX Take 2-4 mg by mouth 3 (three) times daily as needed for muscle spasms (depends on pain if takes 1-2 tablets).   triamterene-hydrochlorothiazide 37.5-25 MG tablet Commonly known as:  MAXZIDE-25 Take 1 each (1 tablet total) by mouth daily.   VIIBRYD 40 MG Tabs Generic drug:  Vilazodone HCl Take 40 mg by mouth daily at 12 noon. AT NOON WITH MEAL       Diagnostic Studies: Dg Knee Left Port  Result Date: 09/26/2016 CLINICAL DATA:  Status post LEFT knee replacement. EXAM: PORTABLE LEFT KNEE - 1-2 VIEW COMPARISON:  None. FINDINGS: Status post LEFT TKR. Satisfactory tibial and femoral components. Small patellar component also well seated. Pneumarthrosis. IMPRESSION: Satisfactory postop appearance. Electronically Signed   By: Staci Righter M.D.   On: 09/26/2016 11:04    Disposition: 01-Home or Self Care    Follow-up Information    Ninetta Lights, MD. Schedule an appointment as soon as possible for a visit in 2 weeks.   Specialty:  Orthopedic Surgery Contact information: [address] [place] [address] [phone]            Signed: Fannie Knee 09/27/2016, 8:34 AM

## 2016-09-26 NOTE — Anesthesia Procedure Notes (Signed)
Anesthesia Regional Block: Adductor canal block   Pre-Anesthetic Checklist: ,, timeout performed, Correct Patient, Correct Site, Correct Laterality, Correct Procedure, Correct Position, site marked, Risks and benefits discussed,  Surgical consent,  Pre-op evaluation,  At surgeon's request and post-op pain management  Laterality: Left  Prep: chloraprep       Needles:  Injection technique: Single-shot  Needle Type: Echogenic Needle     Needle Length: 9cm  Needle Gauge: 21     Additional Needles:   Procedures: ultrasound guided,,,,,,,,  Narrative:  Start time: 09/26/2016 8:17 AM End time: 09/26/2016 8:22 AM Injection made incrementally with aspirations every 5 mL.  Performed by: Personally  Anesthesiologist: Catalina Gravel  Additional Notes: No pain on injection. No increased resistance to injection. Injection made in 5cc increments.  Good needle visualization.  Patient tolerated procedure well.

## 2016-09-26 NOTE — Progress Notes (Signed)
Orthopedic Tech Progress Note Patient Details:  Christine Robles 10-Jun-1955 [id number] On cpm at [facility] Patient ID: [name], female   DOB: 20-Sep-1955, 61 y.o.   MRN: [id number]   Braulio Bosch 09/26/2016, 6:43 PM

## 2016-09-26 NOTE — Anesthesia Procedure Notes (Signed)
Spinal  Patient location during procedure: OR Start time: 09/26/2016 9:52 AM End time: 09/26/2016 9:57 AM Staffing Anesthesiologist: Catalina Gravel Performed: anesthesiologist  Preanesthetic Checklist Completed: patient identified, surgical consent, pre-op evaluation, timeout performed, IV checked, risks and benefits discussed and monitors and equipment checked Spinal Block Patient position: sitting Prep: site prepped and draped and DuraPrep Patient monitoring: continuous pulse ox and blood pressure Approach: midline Location: L3-4 Needle Needle type: Pencan  Needle gauge: 24 G Needle length: 9 cm Additional Notes Functioning IV was confirmed and monitors were applied. Sterile prep and drape, including hand hygiene, mask and sterile gloves were used. The patient was positioned and the spine was prepped. The skin was anesthetized with lidocaine.  Free flow of clear CSF was obtained prior to injecting local anesthetic into the CSF.  The spinal needle aspirated freely following injection.  The needle was carefully withdrawn.  The patient tolerated the procedure well. Consent was obtained prior to procedure with all questions answered and concerns addressed. Risks including but not limited to bleeding, infection, nerve damage, paralysis, failed block, inadequate analgesia, allergic reaction, high spinal, itching and headache were discussed and the patient wished to proceed.   Hoy Morn, MD

## 2016-09-26 NOTE — Anesthesia Postprocedure Evaluation (Signed)
Anesthesia Post Note  Patient: Christine Robles  Procedure(s) Performed: Procedure(s) (LRB): LEFT TOTAL KNEE ARTHROPLASTY (Left)  Patient location during evaluation: PACU Anesthesia Type: Spinal Level of consciousness: oriented and awake and alert Pain management: pain level controlled Vital Signs Assessment: post-procedure vital signs reviewed and stable Respiratory status: spontaneous breathing, respiratory function stable and patient connected to nasal cannula oxygen Cardiovascular status: blood pressure returned to baseline and stable Postop Assessment: no headache, no backache, spinal receding, patient able to bend at knees and no signs of nausea or vomiting Anesthetic complications: no       Last Vitals:  Vitals:   09/26/16 1243 09/26/16 1313  BP: (!) 146/92 (!) 150/77  Pulse: 62 (!) 58  Resp: 18 10  Temp:      Last Pain:  Vitals:   09/26/16 1130  TempSrc:   PainSc: 0-No pain                 Catalina Gravel

## 2016-09-26 NOTE — Op Note (Signed)
NAME:  Christine Robles                     ACCOUNT NO.:  MEDICAL RECORD NO.:  [id number]  LOCATION:                                 FACILITY:  PHYSICIAN:  Ninetta Lights, M.D.      DATE OF BIRTH:  DATE OF PROCEDURE:  09/26/2016 DATE OF DISCHARGE:                              OPERATIVE REPORT   PREOPERATIVE DIAGNOSIS:  Left knee end-stage arthritis, primary localized with Harlow Mares.  POSTOPERATIVE DIAGNOSIS:  Left knee end-stage arthritis, primary localized with Harlow Mares.  PROCEDURES:  Modified minimally invasive left total knee replacement utilizing Stryker triathlon prosthesis.  Press-fit pegged cruciate retaining #4 femur.  Press-fit #4 tibia 9 mm CS insert.  Press-fit 35 mm patellar component.  SURGEON:  Ninetta Lights, M.D.  ASSISTANT:  Elmyra Ricks, PA present throughout the entire case and necessary for timely completion of procedure.  ANESTHESIA:  Spinal.  BLOOD LOSS:  Minimal.  SPECIMENS:  None.  CULTURES:  None.  COMPLICATIONS:  None.  DRESSINGS:  Soft compressive knee immobilizer.  TOURNIQUET TIME:  45 minutes.  DESCRIPTION OF PROCEDURE:  The patient was brought to operating room, and after adequate anesthesia had been obtained, tourniquet applied. Prepped and draped in usual sterile fashion.  Exsanguinated with elevation of Esmarch.  Tourniquet inflated to 350 mmHg.  Straight incision above the patella down to tibial tubercle.  Medial arthrotomy, vastus splitting.  Flexible intramedullary guide distal femur.  8 mm resection 5 degrees of valgus.  Using epicondylar axis, the femur was sized, cut, and fitted for a pegged cruciate-retaining #4 component. Extramedullary guide, proximal tibial resection below the defect medially.  Sized to #4 component, rotation set with trials and then reamed up.  Patella exposed, posterior 10 mm removed.  Drilled, sized, and fitted for a 35-mm component.  Debris cleared throughout.  Copious irrigation.  Definitive  components were then seated into the tibia and femur.  Polyethylene attached to the tibia, knee reduced.  Patella compression clamp.  At completion, excellent biomechanical axis.  Nicely balanced knee.  Full motion and good stability.  Good patellofemoral tracking.  Wound irrigated.  Soft tissue injected with Exparel.  Arthrotomy closed with #1 Vicryl.  Subcutaneous in subcuticular closure.  Sterile compressive dressing applied.  Tourniquet deflated and removed.  Knee immobilizer applied.  Anesthesia reversed.  Brought to the recovery room.  Tolerated the surgery well.  No complications.     Ninetta Lights, M.D.     DFM/MEDQ  D:  09/26/2016  T:  09/26/2016  Job:  [id number]

## 2016-09-26 NOTE — Progress Notes (Signed)
Patient placed in bone foam and afterward refused to use it. Patient educated on importance and use of bone foam, still stating that she does not want to use it at the moment.

## 2016-09-26 NOTE — Evaluation (Signed)
Physical Therapy Evaluation Patient Details Name: Christine Robles MRN: [id number] DOB: [birth year] Today's Date: 09/26/2016   History of Present Illness  Pt is a 61 y/o female s/p L TKA. PMH including but not limited to HTN and spinal stenosis.  Clinical Impression  Pt presented supine in bed with HOB elevated, awake and willing to participate in therapy session. Pt requesting assistance to [facility] when therapist entered room. Prior to admission, pt reported that she was independent with functional mobility and ADLs. Pt lives alone but will have assistance from a friend 24/7 initially. Pt able to perform transfers with min guard and ambulate a very short distance (3') with RW. Pt limited secondary to nausea and pain. Pt would continue to benefit from skilled physical therapy services at this time while admitted and after d/c to address the below listed limitations in order to improve overall safety and independence with functional mobility.     Follow Up Recommendations Home health PT;Supervision/Assistance - 24 hour    Equipment Recommendations  None recommended by PT;Other (comment) (pt has all necessary DME at home)    Recommendations for Other Services       Precautions / Restrictions Precautions Precautions: Fall;Knee Precaution Booklet Issued: Yes (comment) Precaution Comments: PT reviewed positioning of LE following TKA surgery with pt. Restrictions Weight Bearing Restrictions: Yes LLE Weight Bearing: Weight bearing as tolerated      Mobility  Bed Mobility Overal bed mobility: Needs Assistance Bed Mobility: Supine to Sit     Supine to sit: Supervision     General bed mobility comments: increased time, HOB elevated, supervision for safety  Transfers Overall transfer level: Needs assistance Equipment used: Rolling walker (2 wheeled) Transfers: Sit to/from [facility] Sit to Stand: Min guard Stand pivot transfers: Min guard       General transfer  comment: increased time, VC'ing for bilateral hand placment and technique, min guard for safety with rise from bed x1 and from [facility] x1  Ambulation/Gait Ambulation/Gait assistance: Min guard Ambulation Distance (Feet): 3 Feet Assistive device: Rolling walker (2 wheeled) Gait Pattern/deviations: Step-to pattern;Decreased step length - right;Decreased step length - left;Decreased stride length;Antalgic Gait velocity: decreased Gait velocity interpretation: Below normal speed for age/gender General Gait Details: pt limited with distance secondary to feeling "woozy" and nauseaus. Pt with modest instability with use of RW, close min guard for safety  Stairs            Wheelchair Mobility    Modified Rankin (Stroke Patients Only)       Balance Overall balance assessment: Needs assistance Sitting-balance support: Feet supported Sitting balance-Leahy Scale: Fair     Standing balance support: During functional activity;Bilateral upper extremity supported Standing balance-Leahy Scale: Poor Standing balance comment: pt reliant on bilateral UEs on RW                             Pertinent Vitals/Pain Pain Assessment: Faces Faces Pain Scale: Hurts even more Pain Location: L knee Pain Descriptors / Indicators: Sore;Guarding Pain Intervention(s): Monitored during session;Repositioned    Home Living Family/patient expects to be discharged to:: Private residence Living Arrangements: Alone Available Help at Discharge: Friend(s);Available 24 hours/day Type of Home: House Home Access: Stairs to enter Entrance Stairs-Rails: Right Entrance Stairs-Number of Steps: 3 Home Layout: One level Home Equipment: Clinical cytogeneticist - 2 wheels;Cane - single point;Bedside commode;Walker - 4 wheels      Prior Function Level of Independence: Independent with assistive  device(s)         Comments: Pt reported that she occasionally ambulated with use of SPC or RW secondary to pain.      Hand Dominance        Extremity/Trunk Assessment   Upper Extremity Assessment Upper Extremity Assessment: Overall WFL for tasks assessed    Lower Extremity Assessment Lower Extremity Assessment: LLE deficits/detail LLE Deficits / Details: pt with decreased strength and ROM limitations secondary to post-op. Sensation grossly intact. LLE: Unable to fully assess due to pain       Communication   Communication: No difficulties  Cognition Arousal/Alertness: Awake/alert Behavior During Therapy: WFL for tasks assessed/performed Overall Cognitive Status: Within Functional Limits for tasks assessed                                        General Comments      Exercises Total Joint Exercises Knee Flexion: AROM;Left;10 reps;Seated   Assessment/Plan    PT Assessment Patient needs continued PT services  PT Problem List Decreased strength;Decreased range of motion;Decreased activity tolerance;Decreased balance;Decreased mobility;Decreased coordination;Decreased knowledge of use of DME;Decreased safety awareness;Decreased knowledge of precautions;Pain       PT Treatment Interventions DME instruction;Gait training;Stair training;Functional mobility training;Therapeutic activities;Therapeutic exercise;Balance training;Neuromuscular re-education;Patient/family education    PT Goals (Current goals can be found in the Care Plan section)  Acute Rehab PT Goals Patient Stated Goal: return home PT Goal Formulation: With patient Time For Goal Achievement: 10/10/16 Potential to Achieve Goals: Good    Frequency 7X/week   Barriers to discharge        Co-evaluation               End of Session Equipment Utilized During Treatment: Gait belt Activity Tolerance: Patient limited by pain;Other (comment) (limited secondary to nausea) Patient left: in chair;with call bell/phone within reach;with chair alarm set;with family/visitor present;with nursing/sitter in  room Nurse Communication: Mobility status;Other (comment) (request for nausea medication) PT Visit Diagnosis: Other abnormalities of gait and mobility (R26.89);Pain Pain - Right/Left: Left Pain - part of body: Knee    Time: 1640-1700 PT Time Calculation (min) (ACUTE ONLY): 20 min   Charges:   PT Evaluation $PT Eval Moderate Complexity: 1 Procedure     PT G Codes:        Sherie Don, PT, DPT [place] 09/26/2016, 5:15 PM

## 2016-09-26 NOTE — Interval H&P Note (Signed)
History and Physical Interval Note:  09/26/2016 7:37 AM  Christine Robles  has presented today for surgery, with the diagnosis of djd left knee  The various methods of treatment have been discussed with the patient and family. After consideration of risks, benefits and other options for treatment, the patient has consented to  Procedure(s): LEFT TOTAL KNEE ARTHROPLASTY (Left) as a surgical intervention .  The patient's history has been reviewed, patient examined, no change in status, stable for surgery.  I have reviewed the patient's chart and labs.  Questions were answered to the patient's satisfaction.     Ninetta Lights

## 2016-09-26 NOTE — Progress Notes (Signed)
Orthopedic Tech Progress Note Patient Details:  Christine Robles [birth year] [id number]  CPM Left Knee CPM Left Knee: On Left Knee Flexion (Degrees): 90 Left Knee Extension (Degrees): 0 Additional Comments: trapeze bar patient helper   [name] 09/26/2016, 11:29 AM Viewed order from doctor's order list

## 2016-09-26 NOTE — Transfer of Care (Signed)
Immediate Anesthesia Transfer of Care Note  Patient: Christine Robles  Procedure(s) Performed: Procedure(s): LEFT TOTAL KNEE ARTHROPLASTY (Left)  Patient Location: PACU  Anesthesia Type:MAC combined with regional for post-op pain  Level of Consciousness: awake, alert  and oriented  Airway & Oxygen Therapy: Patient Spontanous Breathing  Post-op Assessment: Report given to RN and Post -op Vital signs reviewed and stable  Post vital signs: Reviewed and stable  Last Vitals:  Vitals:   09/26/16 0735 09/26/16 1028  BP: (!) 167/81 (!) 103/54  Pulse: 72 72  Resp: 20 17  Temp: 37 C 36.3 C    Last Pain:  Vitals:   09/26/16 1028  TempSrc:   PainSc: 0-No pain      Patients Stated Pain Goal: 2 (93/81/82 9937)  Complications: No apparent anesthesia complications

## 2016-09-26 NOTE — Discharge Instructions (Signed)

## 2016-09-27 ENCOUNTER — Encounter (HOSPITAL_COMMUNITY): Payer: Self-pay | Admitting: Orthopedic Surgery

## 2016-09-27 LAB — CBC
HEMATOCRIT: 36.7 % (ref 36.0–46.0)
HEMOGLOBIN: 12 g/dL (ref 12.0–15.0)
MCH: 27.8 pg (ref 26.0–34.0)
MCHC: 32.7 g/dL (ref 30.0–36.0)
MCV: 85.2 fL (ref 78.0–100.0)
Platelets: 279 10*3/uL (ref 150–400)
RBC: 4.31 MIL/uL (ref 3.87–5.11)
RDW: 16.8 % — AB (ref 11.5–15.5)
WBC: 5.8 10*3/uL (ref 4.0–10.5)

## 2016-09-27 LAB — BASIC METABOLIC PANEL
ANION GAP: 6 (ref 5–15)
BUN: 12 mg/dL (ref 6–20)
CHLORIDE: 104 mmol/L (ref 101–111)
CO2: 27 mmol/L (ref 22–32)
CREATININE: 1.06 mg/dL — AB (ref 0.44–1.00)
Calcium: 8.4 mg/dL — ABNORMAL LOW (ref 8.9–10.3)
GFR, EST NON AFRICAN AMERICAN: 55 mL/min — AB (ref >60)
Glucose, Bld: 119 mg/dL — ABNORMAL HIGH (ref 65–99)
POTASSIUM: 3.6 mmol/L (ref 3.5–5.1)
Sodium: 137 mmol/L (ref 135–145)

## 2016-09-27 NOTE — Care Management Note (Signed)
Case Management Note  Patient Details  Name: DANYALE RIDINGER MRN: [id number] Date of Birth: Sep 10, 1955  Subjective/Objective:   61 yr old female s/p left total knee arthroplasty.                Action/Plan: Case manager spoke with patient concerning [place] and DME needs. Patient was preoperatively setup with [facility], no changes. She states she has RW and 3in1 at [place] has delvered the CPM to the home also. Patient has a friend that will stay with her for several weeks to assist her.    Expected Discharge Date:  09/27/16               Expected Discharge Plan:   Home with Home Health  In-House Referral:     Discharge planning Services  CM Consult  Post Acute Care Choice:  Home Health Choice offered to:  Patient  DME Arranged:   (has RW and 3in1) DME Agency:  NA  HH Arranged:  PT [place] Agency:  [facility] (formerly [employer])  Status of Service:  Completed, signed off  If discussed at [employer], dates discussed:    Additional Comments:  Ninfa Meeker, RN 09/27/2016, 1:00 PM

## 2016-09-27 NOTE — Progress Notes (Signed)
Occupational Therapy Evaluation Patient Details Name: Christine Robles MRN: [id number] DOB: 18-Oct-1955 Today's Date: 09/27/2016    History of Present Illness Pt is a 61 y/o female s/p L TKA. PMH including but not limited to HTN and spinal stenosis.   Clinical Impression   Completed all education regarding compensatory techniques for ADL, home safety, functional mobility and reducing risk of falls. Pt/friend demonstrated understanding. Pt safe to DC home form OT standpoint. OT signing off.     Follow Up Recommendations  No OT follow up;Supervision - Intermittent    Equipment Recommendations  None recommended by OT    Recommendations for Other Services       Precautions / Restrictions Precautions Precautions: Fall;Knee Precaution Booklet Issued: Yes (comment) Precaution Comments: PT reviewed positioning of LE following TKA surgery with pt. Restrictions Weight Bearing Restrictions: Yes LLE Weight Bearing: Weight bearing as tolerated      Mobility Bed Mobility Overal bed mobility: Needs Assistance Bed Mobility: Supine to Sit     Supine to sit: Modified independent (Device/Increase time)     General bed mobility comments: increased time, HOB elevated  Transfers Overall transfer level: Modified independent Equipment used: Rolling walker (2 wheeled) Transfers: Sit to/from Stand Sit to Stand: Supervision         General transfer comment: increased time to ride from bed, chair, and toilet. Good technique.    Balance Overall balance assessment: Needs assistance Sitting-balance support: Feet supported Sitting balance-Leahy Scale: Good     Standing balance support: During functional activity;Bilateral upper extremity supported Standing balance-Leahy Scale: Fair Standing balance comment: Pt able to pull up panties after toileting with min guard assist, and wash hands at sink with no UE support.                           ADL either performed or assessed  with clinical judgement   ADL Overall ADL's : Needs assistance/impaired                                     Functional mobility during ADLs: Modified independent General ADL Comments: Educated on safe shower transfer technique. Friend present for education. Pt plans to use shower chair. Pt able ot return demonstraet. Pt requries set up with LB ADL. Educated on home safety and reducing risk of falls.      Vision         Perception     Praxis      Pertinent Vitals/Pain Pain Assessment: 0-10 Pain Score: 7  Faces Pain Scale: Hurts little more Pain Location: L knee Pain Descriptors / Indicators: Sore;Guarding Pain Intervention(s): Limited activity within patient's tolerance;Premedicated before session;Ice applied;Repositioned     Hand Dominance     Extremity/Trunk Assessment Upper Extremity Assessment Upper Extremity Assessment: Overall WFL for tasks assessed   Lower Extremity Assessment Lower Extremity Assessment: Defer to PT evaluation   Cervical / Trunk Assessment Cervical / Trunk Assessment: Normal   Communication Communication Communication: No difficulties   Cognition Arousal/Alertness: Awake/alert Behavior During Therapy: WFL for tasks assessed/performed Overall Cognitive Status: Within Functional Limits for tasks assessed                                     General Comments       Exercises   Shoulder Instructions  Home Living Family/patient expects to be discharged to:: Private residence Living Arrangements: Alone Available Help at Discharge: Friend(s);Available 24 hours/day Type of Home: House Home Access: Stairs to enter [employer] of Steps: 3 Entrance Stairs-Rails: Right Home Layout: One level     Bathroom Shower/Tub: Walk-in shower;Tub/shower unit     Bathroom Accessibility: Yes How Accessible: Accessible via walker Home Equipment: Shower seat;Walker - 2 wheels;Cane - single point;Bedside  commode;Walker - 4 wheels          Prior Functioning/Environment Level of Independence: Independent with assistive device(s)        Comments: Pt reported that she occasionally ambulated with use of SPC or RW secondary to pain.        OT Problem List: Decreased strength;Decreased range of motion;Impaired balance (sitting and/or standing);Decreased knowledge of use of DME or AE;Decreased safety awareness;Decreased knowledge of precautions;Obesity;Pain      OT Treatment/Interventions:      OT Goals(Current goals can be found in the care plan section) Acute Rehab OT Goals Patient Stated Goal: return home OT Goal Formulation: All assessment and education complete, DC therapy  OT Frequency:     Barriers to D/C:            Co-evaluation              End of Session Equipment Utilized During Treatment: Rolling walker CPM Left Knee CPM Left Knee: Off Nurse Communication: Mobility status  Activity Tolerance: Patient tolerated treatment well Patient left: in chair;with call bell/phone within reach;with family/visitor present  OT Visit Diagnosis: Unsteadiness on feet (R26.81);Pain;Other abnormalities of gait and mobility (R26.89) Pain - Right/Left: Left Pain - part of body: Knee                Time: 4166-0630 OT Time Calculation (min): 17 min Charges:  OT General Charges $OT Visit: 1 Procedure OT Evaluation $OT Eval Low Complexity: 1 Procedure G-Codes:     Harrie Cazarez, OT/L  [phone] 09/27/2016  Zania Kalisz,HILLARY 09/27/2016, 1:47 PM

## 2016-09-27 NOTE — Progress Notes (Signed)
Physical Therapy Treatment Patient Details Name: Christine Robles MRN: [id number] DOB: Jan 18, 1956 Today's Date: 09/27/2016    History of Present Illness Pt is a 61 y/o female s/p L TKA. PMH including but not limited to HTN and spinal stenosis.    PT Comments    Patient was seen with friend/care giver present. Instructed pt and caregiver on HEP. Patient ambulated 250 feet after ascending and descending 2 stairs with hand rail on each side. Cueing for proper technique with stairs. Care giver was helpful and involved in therapy session. Expected d/c today. Patient should benefit from continued skilled PT. Will continue to follow acutely if still here.    Follow Up Recommendations  Home health PT;Supervision/Assistance - 24 hour     Equipment Recommendations  None recommended by PT;Other (comment) (pt has all necessary DME at home)    Recommendations for Other Services       Precautions / Restrictions Precautions Precautions: Fall;Knee Precaution Booklet Issued: Yes (comment) Precaution Comments: PT reviewed positioning of LE following TKA surgery with pt. Restrictions Weight Bearing Restrictions: Yes LLE Weight Bearing: Weight bearing as tolerated    Mobility  Bed Mobility Overal bed mobility: Needs Assistance Bed Mobility: Supine to Sit     Supine to sit: Modified independent (Device/Increase time)     General bed mobility comments: increased time, HOB elevated  Transfers Overall transfer level: Needs assistance Equipment used: Rolling walker (2 wheeled) Transfers: Sit to/from Stand Sit to Stand: Supervision         General transfer comment: increased time to ride from bed, chair, and toilet. Good technique.  Ambulation/Gait Ambulation/Gait assistance: Min guard Ambulation Distance (Feet): 250 Feet Assistive device: Rolling walker (2 wheeled) Gait Pattern/deviations: Step-to pattern;Decreased step length - right;Decreased step length - left;Decreased stride  length;Antalgic     General Gait Details: Pt required frequent VC for postural control due to hunching shoulders during ambulation. Possibly due to walker needing adjustment.   Stairs            Wheelchair Mobility    Modified Rankin (Stroke Patients Only)       Balance Overall balance assessment: Needs assistance Sitting-balance support: Feet supported Sitting balance-Leahy Scale: Good     Standing balance support: During functional activity;Bilateral upper extremity supported Standing balance-Leahy Scale: Fair Standing balance comment: Pt able to pull up panties after toileting with min guard assist, and wash hands at sink with no UE support.                            Cognition Arousal/Alertness: Awake/alert Behavior During Therapy: WFL for tasks assessed/performed Overall Cognitive Status: Within Functional Limits for tasks assessed                                        Exercises Total Joint Exercises Ankle Circles/Pumps: AROM;Strengthening;Both;10 reps;Supine Quad Sets: AROM;Strengthening;Left;5 reps;Supine Towel Squeeze: AROM;Strengthening;Left;5 reps;Supine Short Arc Quad: AROM;Strengthening;Left;5 reps;Supine Heel Slides: AROM;Strengthening;Left;5 reps;Supine Hip ABduction/ADduction: AROM;Strengthening;Left;5 reps;Supine Straight Leg Raises: AROM;Strengthening;Left;5 reps;Supine Goniometric ROM: 5-80 knee flexion    General Comments        Pertinent Vitals/Pain Faces Pain Scale: Hurts little more Pain Location: L knee Pain Descriptors / Indicators: Sore;Guarding    Home Living                      Prior Function  PT Goals (current goals can now be found in the care plan section) Acute Rehab PT Goals Patient Stated Goal: return home PT Goal Formulation: With patient Time For Goal Achievement: 10/10/16 Potential to Achieve Goals: Good Progress towards PT goals: Progressing toward goals     Frequency    7X/week      PT Plan Current plan remains appropriate    Co-evaluation             End of Session Equipment Utilized During Treatment: Gait belt Activity Tolerance: Patient tolerated treatment well Patient left: in chair;with call bell/phone within reach;with family/visitor present   PT Visit Diagnosis: Other abnormalities of gait and mobility (R26.89);Pain Pain - Right/Left: Left Pain - part of body: Knee     Time: 1833-5825 PT Time Calculation (min) (ACUTE ONLY): 38 min  Charges:  $Gait Training: 8-22 mins $Therapeutic Exercise: 8-22 mins $Therapeutic Activity: 8-22 mins                    G Codes:       Benjiman Core, PTA Acute Rehab [place] 09/27/2016, 11:41 AM

## 2016-09-27 NOTE — Progress Notes (Signed)
Subjective: 1 Day Post-Op Procedure(s) (LRB): LEFT TOTAL KNEE ARTHROPLASTY (Left) Patient reports pain as mild.  Patient feeling very good this am.  Objective: Vital signs in last 24 hours: Temp:  [97.4 F (36.3 C)-98.8 F (37.1 C)] 98 F (36.7 C) (04/26 0622) Pulse Rate:  [55-79] 72 (04/26 0622) Resp:  [9-18] 16 (04/26 0622) BP: (103-150)/(54-92) 131/70 (04/26 0622) SpO2:  [90 %-100 %] 95 % (04/26 0622)  Intake/Output from previous day: 04/25 0701 - 04/26 0700 In: 2100 [I.V.:2100] Out: 150 [Urine:100; Blood:50] Intake/Output this shift: No intake/output data recorded.   Recent Labs  09/27/16 0705  HGB 12.0    Recent Labs  09/27/16 0705  WBC 5.8  RBC 4.31  HCT 36.7  PLT 279    Recent Labs  09/27/16 0705  NA 137  K 3.6  CL 104  CO2 27  BUN 12  CREATININE 1.06*  GLUCOSE 119*  CALCIUM 8.4*   No results for input(s): LABPT, INR in the last 72 hours.  Neurologically intact Neurovascular intact Sensation intact distally Intact pulses distally Dorsiflexion/Plantar flexion intact Incision: dressing C/D/I No cellulitis present Compartment soft  Assessment/Plan: 1 Day Post-Op Procedure(s) (LRB): LEFT TOTAL KNEE ARTHROPLASTY (Left) Advance diet Up with therapy D/C IV fluids Discharge home with home health after second session of PT as long as patient mobilizes well  WBAT LLE Please remove ace bandage and apply ted hose prior to d/c  Fannie Knee 09/27/2016, 8:33 AM

## 2016-09-27 NOTE — Progress Notes (Signed)
Discharge instructions and prescriptions provided to patient.  Patient has all equipment at home, including CPM.  IV removed.  Compression dressing removed as ordered, aquacel maintained.  No questions at time of discharge.  Escorted via wheelchair by the volunteers.

## 2016-10-24 ENCOUNTER — Encounter: Payer: Self-pay | Admitting: Physical Therapy

## 2016-10-24 ENCOUNTER — Ambulatory Visit: Payer: Medicare Other | Attending: Orthopedic Surgery | Admitting: Physical Therapy

## 2016-10-24 DIAGNOSIS — R262 Difficulty in walking, not elsewhere classified: Secondary | ICD-10-CM | POA: Insufficient documentation

## 2016-10-24 DIAGNOSIS — R2242 Localized swelling, mass and lump, left lower limb: Secondary | ICD-10-CM | POA: Insufficient documentation

## 2016-10-24 DIAGNOSIS — M25662 Stiffness of left knee, not elsewhere classified: Secondary | ICD-10-CM | POA: Diagnosis present

## 2016-10-24 DIAGNOSIS — M25562 Pain in left knee: Secondary | ICD-10-CM | POA: Insufficient documentation

## 2016-10-24 NOTE — Therapy (Signed)
[place] [address] [place], [place], [address] Phone: [phone]   Fax:  [phone]  Physical Therapy Evaluation  Patient Details  Name: Christine Robles MRN: [id number] Date of Birth: 05/23/1956 Referring Provider: Percell Miller  Encounter Date: 10/24/2016      PT End of Session - 10/24/16 1513    Visit Number 1   Date for PT Re-Evaluation 12/24/16   PT Start Time 1444   PT Stop Time 1539   PT Time Calculation (min) 55 min   Activity Tolerance Patient tolerated treatment well   Behavior During Therapy [facility] for tasks assessed/performed      Past Medical History:  Diagnosis Date  . ADHD (attention deficit hyperactivity disorder)    "vs Bipolar" (09/26/2016)  . Anxiety   . Bipolar disorder ([place])    "vs ADHD" (09/26/2016)  . Candida esophagitis ([place])   . Chronic lower back pain   . Depression   . Dyspnea   . Esophagitis   . Gastroparesis   . GERD (gastroesophageal reflux disease)   . Headache    "q 4 months now" (09/26/2016)  . Hypertension   . Hypothyroidism   . IBS (irritable bowel syndrome)   . Migraine    "maybe 1-2/year" (09/26/2016)  . OSA on CPAP   . Panic disorder   . Rheumatoid arthritis ([place])    "all over" (09/26/2016)  . Spinal stenosis   . Thyroid disease    hypothyroidism    Past Surgical History:  Procedure Laterality Date  . ABDOMINAL HERNIA REPAIR  02/01/2011   VHR  . BACK SURGERY    . BREAST BIOPSY Left   . CARDIAC CATHETERIZATION  2012  . DILATION AND CURETTAGE OF UTERUS    . HERNIA REPAIR    . KNEE ARTHROSCOPY Left 01/24/2006   hx/notes 10/17/2010  . LAPAROSCOPIC CHOLECYSTECTOMY  05/2001   hx/notes 10/17/2010  . LIPOMA EXCISION Right 12/2001   thigh, hx/notes 10/17/2010  . [place] SURGERY  1990s  . POSTERIOR LUMBAR FUSION  12/2008   "L4, L5, S1"  . TONSILLECTOMY    . TOTAL KNEE ARTHROPLASTY Left 09/26/2016  . TOTAL KNEE ARTHROPLASTY Left 09/26/2016   Procedure: LEFT  TOTAL KNEE ARTHROPLASTY;  Surgeon: Ninetta Lights, MD;  Location: [place];  Service: Orthopedics;  Laterality: Left;  . TUBAL LIGATION      There were no vitals filed for this visit.       Subjective Assessment - 10/24/16 1447    Subjective Patient underwent a left TKR on 09/26/16 due to OA.  Had home PT until last week.     Limitations Walking;House hold activities   Patient Stated Goals have no problems with ADL's and shopping   Currently in Pain? Yes   Pain Score 5    Pain Location Knee   Pain Orientation Left   Pain Descriptors / Indicators Aching;Tightness   Pain Type Surgical pain   Pain Onset 1 to 4 weeks ago   Pain Frequency Constant   Aggravating Factors  being up on it, bending reports pain up to 8-9/10   Pain Relieving Factors ice and pain meds, at best pain a 2/10   Effect of Pain on Daily Activities limits ADL's            [facility] PT Assessment - 10/24/16 0001      Assessment   Medical Diagnosis s/p left TKR   Referring Provider Percell Miller   Onset Date/Surgical Date 09/26/16  Prior Therapy home PT     Precautions   Precautions None     Restrictions   Weight Bearing Restrictions No     Balance Screen   Has the patient fallen in the past 6 months No   Has the patient had a decrease in activity level because of a fear of falling?  No   Is the patient reluctant to leave their home because of a fear of falling?  No     Home Environment   Additional Comments a few steps into the home some light housework     Prior Function   Level of Independence Independent   Vocation On disability   Vocation Requirements previous back surgery   Leisure no exercise     Observation/Other Assessments-Edema    Edema Circumferential     Circumferential Edema   Circumferential - Right 47 cm   Circumferential - Left  52.5 cm     ROM / Strength   AROM / PROM / Strength AROM;PROM;Strength     AROM   AROM Assessment Site Knee   Right/Left Knee Left   Left Knee Extension  10   Left Knee Flexion 95     PROM   PROM Assessment Site Knee   Right/Left Knee Left   Left Knee Extension 5   Left Knee Flexion 105     Strength   Overall Strength Comments 4/5 with pain     Palpation   Palpation comment scar is healed, has a large pocket of swelling laterally, she is very tender medial and lateral joint line     Ambulation/Gait   Gait Comments with SPC, slow, WBOS, antalgic on the left     Standardized Balance Assessment   Standardized Balance Assessment Timed Up and Go Test     Timed Up and Go Test   Normal TUG (seconds) 19                   [facility] Adult PT Treatment/Exercise - 10/24/16 0001      Exercises   Exercises Knee/Hip     Knee/Hip Exercises: Aerobic   Nustep level 4 x 6 minutes     Modalities   Modalities Vasopneumatic     Vasopneumatic   Number Minutes Vasopneumatic  15 minutes   Vasopnuematic Location  Knee   Vasopneumatic Pressure Medium   Vasopneumatic Temperature  36                  PT Short Term Goals - 10/24/16 1515      PT SHORT TERM GOAL #1   Title independent with initial HEP   Time 2   Period Weeks   Status New           PT Long Term Goals - 10/24/16 1515      PT LONG TERM GOAL #1   Title decrease pain 50%   Time 8   Period Weeks   Status New     PT LONG TERM GOAL #2   Title increase AROM of the left knee to 5-115 degrees flexion   Time 8   Period Weeks   Status New     PT LONG TERM GOAL #3   Title walk without assistive device 600 feet without rest and with minimal deviation   Time 8   Period Weeks   Status New     PT LONG TERM GOAL #4   Title decrease swelling 2cm of the left knee   Time  8   Period Weeks   Status New     PT LONG TERM GOAL #5   Title go up and down stairs step over step   Time 8   Period Weeks   Status New               Plan - 10/24/16 1513    Clinical Impression Statement Patient underwent a left TKR on 09/26/16, she reports no issues with  her recovery so far.  Her ROM was 10-95 degrees flexion, she has swelling of 5.5 cm left >right.  Walks with a SPC, slow, antalgic on the left.   Rehab Potential Good   PT Frequency 2x / week   PT Duration 8 weeks   PT Treatment/Interventions ADLs/Self Care Home Management;Cryotherapy;Electrical Stimulation;Gait training;Stair training;Functional mobility training;Patient/family education;Balance training;Therapeutic exercise;Therapeutic activities;Manual techniques;Vasopneumatic Device   PT Next Visit Plan add exercises as tolerated   Consulted and Agree with Plan of Care Patient      Patient will benefit from skilled therapeutic intervention in order to improve the following deficits and impairments:  Abnormal gait, Decreased activity tolerance, Decreased balance, Decreased mobility, Decreased strength, Increased edema, Pain, Decreased range of motion, Difficulty walking  Visit Diagnosis: Acute pain of left knee - Plan: PT plan of care cert/re-cert  Stiffness of left knee, not elsewhere classified - Plan: PT plan of care cert/re-cert  Localized swelling, mass and lump, left lower limb - Plan: PT plan of care cert/re-cert  Difficulty in walking, not elsewhere classified - Plan: PT plan of care cert/re-cert      G-Codes - 57/84/69 1520    Functional Assessment Tool Used (Outpatient Only) foto 67% limitaiton   Functional Limitation Mobility: Walking and moving around   Mobility: Walking and Moving Around Current Status [phone]) At least 60 percent but less than 80 percent impaired, limited or restricted   Mobility: Walking and Moving Around Goal Status [phone]) At least 40 percent but less than 60 percent impaired, limited or restricted       Problem List Patient Active Problem List   Diagnosis Date Noted  . Primary localized osteoarthritis of left knee 09/26/2016  . Streptococcal sore throat 08/10/2012    Class: Acute  . Hypothyroidism 03/08/2009  . PANIC DISORDER 03/08/2009  .  DEPRESSION 03/08/2009  . Essential hypertension 03/08/2009  . GERD 03/08/2009  . SPINAL STENOSIS 03/08/2009  . IRRITABLE BOWEL SYNDROME, HX OF 03/08/2009    Sumner Boast., PT 10/24/2016, 3:25 PM  [address] [place], [place], [address] Phone: [phone]   Fax:  [phone]  Name: Christine Robles MRN: [id number] Date of Birth: 1955/07/30

## 2016-10-30 ENCOUNTER — Encounter: Payer: Self-pay | Admitting: Physical Therapy

## 2016-10-30 ENCOUNTER — Ambulatory Visit: Payer: Medicare Other | Admitting: Physical Therapy

## 2016-10-30 DIAGNOSIS — R262 Difficulty in walking, not elsewhere classified: Secondary | ICD-10-CM

## 2016-10-30 DIAGNOSIS — M25562 Pain in left knee: Secondary | ICD-10-CM | POA: Diagnosis not present

## 2016-10-30 DIAGNOSIS — R2242 Localized swelling, mass and lump, left lower limb: Secondary | ICD-10-CM

## 2016-10-30 DIAGNOSIS — M25662 Stiffness of left knee, not elsewhere classified: Secondary | ICD-10-CM

## 2016-10-30 NOTE — Therapy (Signed)
[place] [address] [place], [place], [address] Phone: [phone]   Fax:  [phone]  Physical Therapy Treatment  Patient Details  Name: Christine Robles MRN: [id number] Date of Birth: 19-May-1956 Referring Provider: Percell Miller  Encounter Date: 10/30/2016      PT End of Session - 10/30/16 1512    Visit Number 2   Date for PT Re-Evaluation 12/24/16   PT Start Time 1430   PT Stop Time 1525   PT Time Calculation (min) 55 min   Activity Tolerance Patient tolerated treatment well   Behavior During Therapy [facility] for tasks assessed/performed      Past Medical History:  Diagnosis Date  . ADHD (attention deficit hyperactivity disorder)    "vs Bipolar" (09/26/2016)  . Anxiety   . Bipolar disorder ([place])    "vs ADHD" (09/26/2016)  . Candida esophagitis ([place])   . Chronic lower back pain   . Depression   . Dyspnea   . Esophagitis   . Gastroparesis   . GERD (gastroesophageal reflux disease)   . Headache    "q 4 months now" (09/26/2016)  . Hypertension   . Hypothyroidism   . IBS (irritable bowel syndrome)   . Migraine    "maybe 1-2/year" (09/26/2016)  . OSA on CPAP   . Panic disorder   . Rheumatoid arthritis ([place])    "all over" (09/26/2016)  . Spinal stenosis   . Thyroid disease    hypothyroidism    Past Surgical History:  Procedure Laterality Date  . ABDOMINAL HERNIA REPAIR  02/01/2011   VHR  . BACK SURGERY    . BREAST BIOPSY Left   . CARDIAC CATHETERIZATION  2012  . DILATION AND CURETTAGE OF UTERUS    . HERNIA REPAIR    . KNEE ARTHROSCOPY Left 01/24/2006   hx/notes 10/17/2010  . LAPAROSCOPIC CHOLECYSTECTOMY  05/2001   hx/notes 10/17/2010  . LIPOMA EXCISION Right 12/2001   thigh, hx/notes 10/17/2010  . [place] SURGERY  1990s  . POSTERIOR LUMBAR FUSION  12/2008   "L4, L5, S1"  . TONSILLECTOMY    . TOTAL KNEE ARTHROPLASTY Left 09/26/2016  . TOTAL KNEE ARTHROPLASTY Left 09/26/2016   Procedure: LEFT TOTAL  KNEE ARTHROPLASTY;  Surgeon: Ninetta Lights, MD;  Location: [place];  Service: Orthopedics;  Laterality: Left;  . TUBAL LIGATION      There were no vitals filed for this visit.      Subjective Assessment - 10/30/16 1433    Subjective Pt reports that she thinks she turned her knee yesterday and today   Currently in Pain? Yes   Pain Score 5    Pain Location Knee   Pain Orientation Left            [facility] PT Assessment - 10/30/16 0001      AROM   AROM Assessment Site Knee   Right/Left Knee Left   Left Knee Extension 4   Left Knee Flexion 108                     [facility] Adult PT Treatment/Exercise - 10/30/16 0001      Knee/Hip Exercises: Aerobic   Nustep level 4 x 6 minutes     Knee/Hip Exercises: Machines for Strengthening   Cybex Knee Extension 5lb 2x10   Cybex Knee Flexion 25lb 2x10   Cybex Leg Press 20lb 3x10     Knee/Hip Exercises: Seated   Long Arc Quad Left;2 sets;15  reps   Long Arc Quad Weight 2 lbs.   Ball Squeeze 2x10   Hamstring Curl Left;2 sets;10 reps   Hamstring Limitations green Tband      Modalities   Modalities Vasopneumatic     Vasopneumatic   Number Minutes Vasopneumatic  15 minutes   Vasopnuematic Location  Knee   Vasopneumatic Pressure Medium   Vasopneumatic Temperature  36                  PT Short Term Goals - 10/30/16 1512      PT SHORT TERM GOAL #1   Title independent with initial HEP   Status Partially Met           PT Long Term Goals - 10/24/16 1515      PT LONG TERM GOAL #1   Title decrease pain 50%   Time 8   Period Weeks   Status New     PT LONG TERM GOAL #2   Title increase AROM of the left knee to 5-115 degrees flexion   Time 8   Period Weeks   Status New     PT LONG TERM GOAL #3   Title walk without assistive device 600 feet without rest and with minimal deviation   Time 8   Period Weeks   Status New     PT LONG TERM GOAL #4   Title decrease swelling 2cm of the left knee   Time 8    Period Weeks   Status New     PT LONG TERM GOAL #5   Title go up and down stairs step over step   Time 8   Period Weeks   Status New               Plan - 10/30/16 1512    Clinical Impression Statement Pt able to complete all of today's exercises, does reports some R knee soreness with seated HS curls with Tband resistance. Pt has progressed increasing her L knee AROM.   Rehab Potential Good   PT Frequency 2x / week   PT Duration 8 weeks   PT Treatment/Interventions ADLs/Self Care Home Management;Cryotherapy;Electrical Stimulation;Gait training;Stair training;Functional mobility training;Patient/family education;Balance training;Therapeutic exercise;Therapeutic activities;Manual techniques;Vasopneumatic Device   PT Next Visit Plan add exercises as tolerated      Patient will benefit from skilled therapeutic intervention in order to improve the following deficits and impairments:  Abnormal gait, Decreased activity tolerance, Decreased balance, Decreased mobility, Decreased strength, Increased edema, Pain, Decreased range of motion, Difficulty walking  Visit Diagnosis: Acute pain of left knee  Stiffness of left knee, not elsewhere classified  Localized swelling, mass and lump, left lower limb  Difficulty in walking, not elsewhere classified     Problem List Patient Active Problem List   Diagnosis Date Noted  . Primary localized osteoarthritis of left knee 09/26/2016  . Streptococcal sore throat 08/10/2012    Class: Acute  . Hypothyroidism 03/08/2009  . PANIC DISORDER 03/08/2009  . DEPRESSION 03/08/2009  . Essential hypertension 03/08/2009  . GERD 03/08/2009  . SPINAL STENOSIS 03/08/2009  . IRRITABLE BOWEL SYNDROME, HX OF 03/08/2009    Scot Jun 10/30/2016, 3:14 PM  [address] [place], [place], [address] Phone: [phone]   Fax:  [phone]  Name: Christine Robles MRN:  [id number] Date of Birth: 09-13-1955

## 2016-11-01 ENCOUNTER — Ambulatory Visit: Payer: Medicare Other | Admitting: Physical Therapy

## 2016-11-01 ENCOUNTER — Encounter: Payer: Self-pay | Admitting: Physical Therapy

## 2016-11-01 DIAGNOSIS — R2242 Localized swelling, mass and lump, left lower limb: Secondary | ICD-10-CM

## 2016-11-01 DIAGNOSIS — M25562 Pain in left knee: Secondary | ICD-10-CM | POA: Diagnosis not present

## 2016-11-01 DIAGNOSIS — M25662 Stiffness of left knee, not elsewhere classified: Secondary | ICD-10-CM

## 2016-11-01 NOTE — Therapy (Signed)
[place] [address] [place], [place], [address] Phone: [phone]   Fax:  [phone]  Physical Therapy Treatment  Patient Details  Name: Christine Robles MRN: [id number] Date of Birth: 08-19-1955 Referring Provider: Percell Miller  Encounter Date: 11/01/2016      PT End of Session - 11/01/16 1558    Visit Number 3   Date for PT Re-Evaluation 12/24/16   PT Start Time 0947   PT Stop Time 1625   PT Time Calculation (min) 55 min      Past Medical History:  Diagnosis Date  . ADHD (attention deficit hyperactivity disorder)    "vs Bipolar" (09/26/2016)  . Anxiety   . Bipolar disorder ([place])    "vs ADHD" (09/26/2016)  . Candida esophagitis ([place])   . Chronic lower back pain   . Depression   . Dyspnea   . Esophagitis   . Gastroparesis   . GERD (gastroesophageal reflux disease)   . Headache    "q 4 months now" (09/26/2016)  . Hypertension   . Hypothyroidism   . IBS (irritable bowel syndrome)   . Migraine    "maybe 1-2/year" (09/26/2016)  . OSA on CPAP   . Panic disorder   . Rheumatoid arthritis ([place])    "all over" (09/26/2016)  . Spinal stenosis   . Thyroid disease    hypothyroidism    Past Surgical History:  Procedure Laterality Date  . ABDOMINAL HERNIA REPAIR  02/01/2011   VHR  . BACK SURGERY    . BREAST BIOPSY Left   . CARDIAC CATHETERIZATION  2012  . DILATION AND CURETTAGE OF UTERUS    . HERNIA REPAIR    . KNEE ARTHROSCOPY Left 01/24/2006   hx/notes 10/17/2010  . LAPAROSCOPIC CHOLECYSTECTOMY  05/2001   hx/notes 10/17/2010  . LIPOMA EXCISION Right 12/2001   thigh, hx/notes 10/17/2010  . [place] SURGERY  1990s  . POSTERIOR LUMBAR FUSION  12/2008   "L4, L5, S1"  . TONSILLECTOMY    . TOTAL KNEE ARTHROPLASTY Left 09/26/2016  . TOTAL KNEE ARTHROPLASTY Left 09/26/2016   Procedure: LEFT TOTAL KNEE ARTHROPLASTY;  Surgeon: Ninetta Lights, MD;  Location: [place];  Service: Orthopedics;  Laterality: Left;  .  TUBAL LIGATION      There were no vitals filed for this visit.      Subjective Assessment - 11/01/16 1530    Subjective i think i pushed to hard last session   Currently in Pain? Yes   Pain Score 5    Pain Location Knee   Pain Orientation Left;Medial                         [facility] Adult PT Treatment/Exercise - 11/01/16 0001      Knee/Hip Exercises: Aerobic   Nustep L 4 6 min LE only     Knee/Hip Exercises: Standing   Knee Flexion Strengthening;Left;2 sets;10 reps  2#   Other Standing Knee Exercises 2# hip flex 2 sets 10     Knee/Hip Exercises: Seated   Long Arc Quad Strengthening;Left;2 sets;10 reps;Weights   Long Arc Quad Weight 2 lbs.  3 sec TKE hold   Ball Squeeze 2x10   Other Seated Knee/Hip Exercises LAQ, TKE and HS curl red tband 15 times   Sit to Sand 15 reps;without UE support  wt ball     Modalities   Modalities Vasopneumatic;Electrical Stimulation     Electrical Stimulation  Electrical Stimulation Location left knee   Electrical Stimulation Action IFC   Electrical Stimulation Goals Pain;Edema     Vasopneumatic   Number Minutes Vasopneumatic  15 minutes   Vasopnuematic Location  Knee   Vasopneumatic Pressure Medium     Manual Therapy   Manual Therapy Joint mobilization;Soft tissue mobilization   Joint Mobilization flex left knee   Soft tissue mobilization knee                  PT Short Term Goals - 11/01/16 1600      PT SHORT TERM GOAL #1   Title independent with initial HEP   Status Achieved           PT Long Term Goals - 10/24/16 1515      PT LONG TERM GOAL #1   Title decrease pain 50%   Time 8   Period Weeks   Status New     PT LONG TERM GOAL #2   Title increase AROM of the left knee to 5-115 degrees flexion   Time 8   Period Weeks   Status New     PT LONG TERM GOAL #3   Title walk without assistive device 600 feet without rest and with minimal deviation   Time 8   Period Weeks   Status New      PT LONG TERM GOAL #4   Title decrease swelling 2cm of the left knee   Time 8   Period Weeks   Status New     PT LONG TERM GOAL #5   Title go up and down stairs step over step   Time 8   Period Weeks   Status New               Plan - 11/01/16 1559    Clinical Impression Statement large pocket of fluid left lateral knee. pt felt overdid last session so backed off and added jt mobs and ROM. added IFC to vaso for swelling   PT Next Visit Plan progress. MD 11/06/16      Patient will benefit from skilled therapeutic intervention in order to improve the following deficits and impairments:     Visit Diagnosis: Acute pain of left knee  Stiffness of left knee, not elsewhere classified  Localized swelling, mass and lump, left lower limb     Problem List Patient Active Problem List   Diagnosis Date Noted  . Primary localized osteoarthritis of left knee 09/26/2016  . Streptococcal sore throat 08/10/2012    Class: Acute  . Hypothyroidism 03/08/2009  . PANIC DISORDER 03/08/2009  . DEPRESSION 03/08/2009  . Essential hypertension 03/08/2009  . GERD 03/08/2009  . SPINAL STENOSIS 03/08/2009  . IRRITABLE BOWEL SYNDROME, HX OF 03/08/2009    PAYSEUR,ANGIE PTA 11/01/2016, 4:01 PM  [place] [address] [place], [place], [address] Phone: [phone]   Fax:  [phone]  Name: Christine Robles MRN: [id number] Date of Birth: 05-Jul-1955

## 2016-11-06 ENCOUNTER — Ambulatory Visit: Payer: Medicare Other | Attending: Orthopedic Surgery | Admitting: Physical Therapy

## 2016-11-06 ENCOUNTER — Encounter: Payer: Self-pay | Admitting: Physical Therapy

## 2016-11-06 DIAGNOSIS — M25562 Pain in left knee: Secondary | ICD-10-CM | POA: Diagnosis not present

## 2016-11-06 DIAGNOSIS — R2242 Localized swelling, mass and lump, left lower limb: Secondary | ICD-10-CM

## 2016-11-06 DIAGNOSIS — M25662 Stiffness of left knee, not elsewhere classified: Secondary | ICD-10-CM | POA: Diagnosis present

## 2016-11-06 NOTE — Therapy (Signed)
[place] [address] [place], [place], [address] Phone: [phone]   Fax:  [phone]  Physical Therapy Treatment  Patient Details  Name: Christine Robles MRN: [id number] Date of Birth: 1955-07-18 Referring Provider: Percell Miller  Encounter Date: 11/06/2016      PT End of Session - 11/06/16 0822    Visit Number 4   Date for PT Re-Evaluation 12/24/16   PT Start Time 0800   PT Stop Time 0850   PT Time Calculation (min) 50 min      Past Medical History:  Diagnosis Date  . ADHD (attention deficit hyperactivity disorder)    "vs Bipolar" (09/26/2016)  . Anxiety   . Bipolar disorder ([place])    "vs ADHD" (09/26/2016)  . Candida esophagitis ([place])   . Chronic lower back pain   . Depression   . Dyspnea   . Esophagitis   . Gastroparesis   . GERD (gastroesophageal reflux disease)   . Headache    "q 4 months now" (09/26/2016)  . Hypertension   . Hypothyroidism   . IBS (irritable bowel syndrome)   . Migraine    "maybe 1-2/year" (09/26/2016)  . OSA on CPAP   . Panic disorder   . Rheumatoid arthritis ([place])    "all over" (09/26/2016)  . Spinal stenosis   . Thyroid disease    hypothyroidism    Past Surgical History:  Procedure Laterality Date  . ABDOMINAL HERNIA REPAIR  02/01/2011   VHR  . BACK SURGERY    . BREAST BIOPSY Left   . CARDIAC CATHETERIZATION  2012  . DILATION AND CURETTAGE OF UTERUS    . HERNIA REPAIR    . KNEE ARTHROSCOPY Left 01/24/2006   hx/notes 10/17/2010  . LAPAROSCOPIC CHOLECYSTECTOMY  05/2001   hx/notes 10/17/2010  . LIPOMA EXCISION Right 12/2001   thigh, hx/notes 10/17/2010  . [place] SURGERY  1990s  . POSTERIOR LUMBAR FUSION  12/2008   "L4, L5, S1"  . TONSILLECTOMY    . TOTAL KNEE ARTHROPLASTY Left 09/26/2016  . TOTAL KNEE ARTHROPLASTY Left 09/26/2016   Procedure: LEFT TOTAL KNEE ARTHROPLASTY;  Surgeon: Ninetta Lights, MD;  Location: [place];  Service: Orthopedics;  Laterality: Left;  .  TUBAL LIGATION      There were no vitals filed for this visit.      Subjective Assessment - 11/06/16 0758    Subjective seeing MD today, I think it needs drained   Currently in Pain? Yes   Pain Score 4    Pain Location Knee   Pain Orientation Left            [facility] PT Assessment - 11/06/16 0001      AROM   AROM Assessment Site Knee   Right/Left Knee Left   Left Knee Extension 0   Left Knee Flexion 120     Strength   Overall Strength Comments 4/5                     [facility] Adult PT Treatment/Exercise - 11/06/16 0001      Knee/Hip Exercises: Aerobic   Recumbent Bike 6 min     Knee/Hip Exercises: Machines for Strengthening   Cybex Knee Extension 5lb 2x10   Cybex Knee Flexion 25lb 2x10   Cybex Leg Press 30# 3 sets 10  calf raises 30# 2 sets 15     Knee/Hip Exercises: Standing   Knee Flexion Strengthening;Left;2 sets;10 reps  3#  Lateral Step Up Left;15 reps;Hand Hold: 2;Step Height: 6"   Forward Step Up Left;15 reps;Hand Hold: 2;Step Height: 6"   Other Standing Knee Exercises green tband hip 3 way 15 each     Knee/Hip Exercises: Seated   Long Arc Quad Strengthening;Left;2 sets;10 reps;Weights   Long Arc Quad Weight 3 lbs.     Vasopneumatic   Number Minutes Vasopneumatic  15 minutes   Vasopnuematic Location  Knee   Vasopneumatic Pressure Medium                  PT Short Term Goals - 11/01/16 1600      PT SHORT TERM GOAL #1   Title independent with initial HEP   Status Achieved           PT Long Term Goals - 11/06/16 8264      PT LONG TERM GOAL #1   Title decrease pain 50%   Status On-going     PT LONG TERM GOAL #2   Title increase AROM of the left knee to 5-115 degrees flexion   Status Achieved     PT LONG TERM GOAL #3   Title walk without assistive device 600 feet without rest and with minimal deviation   Status Partially Met     PT LONG TERM GOAL #4   Title decrease swelling 2cm of the left knee   Status  On-going     PT LONG TERM GOAL #5   Title go up and down stairs step over step   Status On-going               Plan - 11/06/16 1583    Clinical Impression Statement excellent ROM, MMT 4/5 but does fatigue quickly. slight limp and antalgic gait d/ hip weakness with knee limitations. pt with large pocket of lateral swelling   PT Next Visit Plan gait and func strength      Patient will benefit from skilled therapeutic intervention in order to improve the following deficits and impairments:     Visit Diagnosis: Acute pain of left knee  Localized swelling, mass and lump, left lower limb  Stiffness of left knee, not elsewhere classified     Problem List Patient Active Problem List   Diagnosis Date Noted  . Primary localized osteoarthritis of left knee 09/26/2016  . Streptococcal sore throat 08/10/2012    Class: Acute  . Hypothyroidism 03/08/2009  . PANIC DISORDER 03/08/2009  . DEPRESSION 03/08/2009  . Essential hypertension 03/08/2009  . GERD 03/08/2009  . SPINAL STENOSIS 03/08/2009  . IRRITABLE BOWEL SYNDROME, HX OF 03/08/2009    Edita Weyenberg,ANGIE PTA 11/06/2016, 8:25 AM  [address] [address] [place], [place], [address] Phone: [phone]   Fax:  [phone]  Name: Christine Robles MRN: [id number] Date of Birth: [birth year]

## 2016-11-08 ENCOUNTER — Ambulatory Visit: Payer: Medicare Other | Admitting: Physical Therapy

## 2017-09-18 ENCOUNTER — Encounter: Payer: Self-pay | Admitting: Neurology

## 2017-09-18 ENCOUNTER — Other Ambulatory Visit: Payer: Self-pay | Admitting: Family Medicine

## 2017-09-18 DIAGNOSIS — Z1231 Encounter for screening mammogram for malignant neoplasm of breast: Secondary | ICD-10-CM

## 2017-09-30 ENCOUNTER — Encounter: Payer: Self-pay | Admitting: Neurology

## 2017-10-21 ENCOUNTER — Ambulatory Visit: Payer: Medicare Other

## 2017-11-13 ENCOUNTER — Ambulatory Visit: Payer: Medicare Other

## 2017-12-11 ENCOUNTER — Ambulatory Visit
Admission: RE | Admit: 2017-12-11 | Discharge: 2017-12-11 | Disposition: A | Discharge status: Home / Self Care | Payer: Medicare Other | Source: Ambulatory Visit | Attending: Physical Medicine and Rehabilitation | Admitting: Physical Medicine and Rehabilitation

## 2017-12-11 ENCOUNTER — Other Ambulatory Visit: Payer: Self-pay | Admitting: Physical Medicine and Rehabilitation

## 2017-12-11 DIAGNOSIS — M25552 Pain in left hip: Principal | ICD-10-CM

## 2017-12-11 DIAGNOSIS — M25551 Pain in right hip: Secondary | ICD-10-CM

## 2018-01-10 ENCOUNTER — Ambulatory Visit: Payer: Medicare Other | Admitting: Neurology

## 2018-01-10 ENCOUNTER — Encounter: Payer: Self-pay | Admitting: Neurology

## 2018-01-10 VITALS — BP 136/90 | HR 82 | Ht 66.0 in | Wt 189.0 lb

## 2018-01-10 DIAGNOSIS — G43109 Migraine with aura, not intractable, without status migrainosus: Secondary | ICD-10-CM

## 2018-01-10 NOTE — Progress Notes (Signed)
NEUROLOGY CONSULTATION NOTE  Christine Robles MRN: [id number] DOB: [birth year]  Referring provider: Yaakov Guthrie, MD Primary care provider: Yaakov Guthrie, MD  Reason for consult:  Ocular migraines  HISTORY OF PRESENT ILLNESS: Christine Robles is a 62 year old right-handed female with hypertension, RA, hypothyroidism, depression, Bipolar disorder, and hypercholesterolemia who presents for ocular migraines.  History supplemented by referring provider's note.  Several weeks ago, she was in a store and suddenly developed black out of vision, only able to see in her periphery.  She did not try closing either eye to see if it involved both eyes.  It was followed by wavy lines moving across her vision.  It lasted 10 to 15 minutes and resolved.  Either than evening or the next morning, she had a diffuse severe non-throbbing headache associated with nausea that lasted the entire day.  No associated unilateral numbness or weakness.  No aggravating or relieving factors.  She treated it with Tylenol.  She has history of migraines and would experience these auras in her 59s and 40s.  They subsequently resolved but she had another one a couple of years ago.  She does have temporal or occipital moderate nonthrobbing headaches about 2 or 3 days a month.  Current medication:  Lamotrigine 75mg , Lithium, Wellbutrin XL, levothyroxine, Maxzide-25, oxycodone, Xanax Past medications:  topiramate (cognitive impairment), Effexor, Vyvanse, Viibryd  CT of head from 10/29/03 personally reviewed and demonstrated on acute intracranial abnormality.  Depression/anxiety:  Yes  PAST MEDICAL HISTORY: Past Medical History:  Diagnosis Date  . ADHD (attention deficit hyperactivity disorder)    "vs Bipolar" (09/26/2016)  . Anxiety   . Bipolar disorder ([place])    "vs ADHD" (09/26/2016)  . Candida esophagitis ([place])   . Chronic lower back pain   . Depression   . Dyspnea   . Esophagitis   . Gastroparesis   . GERD  (gastroesophageal reflux disease)   . Headache    "q 4 months now" (09/26/2016)  . Hypertension   . Hypothyroidism   . IBS (irritable bowel syndrome)   . Migraine    "maybe 1-2/year" (09/26/2016)  . OSA on CPAP   . Panic disorder   . Rheumatoid arthritis ([place])    "all over" (09/26/2016)  . Spinal stenosis   . Thyroid disease    hypothyroidism    PAST SURGICAL HISTORY: Past Surgical History:  Procedure Laterality Date  . ABDOMINAL HERNIA REPAIR  02/01/2011   [facility]  . BACK SURGERY    . BREAST BIOPSY Left   . CARDIAC CATHETERIZATION  2012  . DILATION AND CURETTAGE OF UTERUS    . HERNIA REPAIR    . KNEE ARTHROSCOPY Left 01/24/2006   hx/notes 10/17/2010  . LAPAROSCOPIC CHOLECYSTECTOMY  05/2001   hx/notes 10/17/2010  . LIPOMA EXCISION Right 12/2001   thigh, hx/notes 10/17/2010  . [place] SURGERY  1990s  . POSTERIOR LUMBAR FUSION  12/2008   "L4, L5, S1"  . TONSILLECTOMY    . TOTAL KNEE ARTHROPLASTY Left 09/26/2016  . TOTAL KNEE ARTHROPLASTY Left 09/26/2016   Procedure: LEFT TOTAL KNEE ARTHROPLASTY;  Surgeon: Ninetta Lights, MD;  Location: [facility];  Service: Orthopedics;  Laterality: Left;  . TUBAL LIGATION      MEDICATIONS: Current Outpatient Medications on File Prior to Visit  Medication Sig Dispense Refill  . ALPRAZolam (XANAX) 1 MG tablet Take 0.5 mg by mouth 3 (three) times daily.     Marland Kitchen aspirin EC 325 MG tablet Take 1 tablet (325  mg total) by mouth daily. 1 tab a day for the next 30 days to prevent blood clots 30 tablet 0  . benztropine (COGENTIN) 0.5 MG tablet Take 1 tablet (0.5 mg total) by mouth 2 (two) times daily as needed. (Patient not taking: Reported on 09/11/2016) 60 tablet 0  . ENBREL SURECLICK 50 MG/ML injection Inject 50 mg into the skin once a week. Mondays    . folic acid (FOLVITE) 1 MG tablet Take 1 mg by mouth 3 (three) times daily.    Marland Kitchen lamoTRIgine (LAMICTAL) 100 MG tablet Take 100 mg by mouth daily.    Marland Kitchen levothyroxine (SYNTHROID, LEVOTHROID) 100 MCG tablet  Take 1 tablet (100 mcg total) by mouth daily. Take before breakfast with no other medications 30 tablet 0  . lisdexamfetamine (VYVANSE) 70 MG capsule Take 70 mg by mouth daily.    . meclizine (ANTIVERT) 25 MG tablet TAKE 1 TABLET 3 TIMES DAILY AS NEEDED FOR DIZZINESS  0  . Methotrexate, PF, 20 MG/0.4ML SOAJ Inject 0.8 mLs into the skin every 7 (seven) days. SUNDAYS    . metoCLOPramide (REGLAN) 5 MG tablet TAKE 2 TABLETS BY MOUTH THREE TIMES DAILY BEFORE A MEAL (Patient not taking: Reported on 09/11/2016) 180 tablet 0  . metoCLOPramide (REGLAN) 5 MG tablet TAKE 2 TABLETS BY MOUTH THREE TIMES DAILY BEFORE A MEAL 180 tablet 0  . Multiple Vitamin (MULTIVITAMIN) capsule Take 1 capsule by mouth daily. 30 capsule 0  . omeprazole (PRILOSEC) 20 MG capsule Take 20-40 mg by mouth daily as needed (acid reflux). 1-2 CAPSULES DEPENDING ON ACID REFLUX    . Oxycodone HCl 10 MG TABS TAKE 1 TABLET 5 TIMES DAILY  0  . pantoprazole (PROTONIX) 40 MG tablet TAKE 1 TABLET BY MOUTH TWICE DAILY (Patient not taking: Reported on 09/11/2016) 60 tablet 2  . Polyethyl Glycol-Propyl Glycol (SYSTANE OP) Apply 2 drops to eye daily as needed (dry eyes).    . promethazine (PHENERGAN) 25 MG tablet Take 1 tablet (25 mg total) by mouth every 6 (six) hours as needed for nausea or vomiting. 30 tablet 1  . ramelteon (ROZEREM) 8 MG tablet Take 8 mg by mouth at bedtime.    Marland Kitchen tiZANidine (ZANAFLEX) 2 MG tablet Take 2-4 mg by mouth 3 (three) times daily as needed for muscle spasms (depends on pain if takes 1-2 tablets).     . triamterene-hydrochlorothiazide (MAXZIDE-25) 37.5-25 MG per tablet Take 1 each (1 tablet total) by mouth daily. 30 tablet 0  . VIIBRYD 40 MG TABS Take 40 mg by mouth daily at 12 noon. AT NOON WITH MEAL     No current facility-administered medications on file prior to visit.     ALLERGIES: Allergies  Allergen Reactions  . Escitalopram Oxalate Other (See Comments)    Stroke like symptoms. Pt thinks she may have been  given too big a dose.  Marland Kitchen Buspirone Hcl Other (See Comments)    Abnormal behavior  . Fentanyl Other (See Comments)    Headache   . Morphine And Related Other (See Comments)    headache    FAMILY HISTORY: Family History  Problem Relation Age of Onset  . Leukemia Mother   . Hypertension Father   . Heart disease Father   . Cirrhosis Father   . Crohn's disease Father   . Heart disease Brother        pacemaker  . Heart disease Maternal Grandmother   . Diabetes Brother   . Diabetes Unknown  grandfather  . Colon cancer Neg Hx     SOCIAL HISTORY: Social History   Socioeconomic History  . Marital status: Widowed    Spouse name: Not on file  . Number of children: Not on file  . Years of education: Not on file  . Highest education level: Not on file  Occupational History  . Not on file  Social Needs  . Financial resource strain: Not on file  . Food insecurity:    Worry: Not on file    Inability: Not on file  . Transportation needs:    Medical: Not on file    Non-medical: Not on file  Tobacco Use  . Smoking status: Former Smoker    Packs/day: 1.00    Years: 15.00    Pack years: 15.00    Types: Cigarettes    Last attempt to quit: 08/08/1998    Years since quitting: 19.4  . Smokeless tobacco: Never Used  Substance and Sexual Activity  . Alcohol use: No  . Drug use: Yes    Types: Cocaine, Marijuana    Comment: 09/26/2016 "nothing since at least the 1980s"  . Sexual activity: Not Currently  Lifestyle  . Physical activity:    Days per week: Not on file    Minutes per session: Not on file  . Stress: Not on file  Relationships  . Social connections:    Talks on phone: Not on file    Gets together: Not on file    Attends religious service: Not on file    Active member of club or organization: Not on file    Attends meetings of clubs or organizations: Not on file    Relationship status: Not on file  . Intimate partner violence:    Fear of current or ex partner:  Not on file    Emotionally abused: Not on file    Physically abused: Not on file    Forced sexual activity: Not on file  Other Topics Concern  . Not on file  Social History Narrative  . Not on file    REVIEW OF SYSTEMS: Constitutional: No fevers, chills, or sweats, no generalized fatigue, change in appetite Eyes: No visual changes, double vision, eye pain Ear, nose and throat: No hearing loss, ear pain, nasal congestion, sore throat Cardiovascular: No chest pain, palpitations Respiratory:  No shortness of breath at rest or with exertion, wheezes GastrointestinaI: No nausea, vomiting, diarrhea, abdominal pain, fecal incontinence Genitourinary:  No dysuria, urinary retention or frequency Musculoskeletal:  Back pain Integumentary: No rash, pruritus, skin lesions Neurological: as above Psychiatric: No depression, insomnia, anxiety Endocrine: No palpitations, fatigue, diaphoresis, mood swings, change in appetite, change in weight, increased thirst Hematologic/Lymphatic:  No purpura, petechiae. Allergic/Immunologic: no itchy/runny eyes, nasal congestion, recent allergic reactions, rashes  PHYSICAL EXAM: Vitals:   01/10/18 0931  BP: 136/90  Pulse: 82  SpO2: 97%   General: No acute distress.  Patient appears well-groomed.  Head:  Normocephalic/atraumatic Eyes:  fundi examined but not visualized Neck: supple, no paraspinal tenderness, full range of motion Back: No paraspinal tenderness Heart: regular rate and rhythm Lungs: Clear to auscultation bilaterally. Vascular: No carotid bruits. Neurological Exam: Mental status: alert and oriented to person, place, and time, recent and remote memory intact, fund of knowledge intact, attention and concentration intact, speech fluent and not dysarthric, language intact. Cranial nerves: CN I: not tested CN II: pupils equal, round and reactive to light, visual fields intact CN III, IV, VI:  full range of motion,  no nystagmus, no ptosis CN V:  facial sensation intact CN VII: upper and lower face symmetric CN VIII: hearing intact CN IX, X: gag intact, uvula midline CN XI: sternocleidomastoid and trapezius muscles intact CN XII: tongue midline Bulk & Tone: normal, no fasciculations. Motor:  5/5 throughout  Sensation: temperature and vibration sensation intact. Deep Tendon Reflexes:  2+ throughout, toes downgoing.  Finger to nose testing:  Without dysmetria.  Heel to shin:  Without dysmetria.  Gait:  Antalgic gait  Able to turn and tandem walk. Romberg with sway.  IMPRESSION: Ocular migraine/migraine with aura, without status migrainosus, not intractable Episodic tension-type headaches, not intractable  As her migraines and tension-type headaches are infrequent, we will defer initiating a daily preventative medication as side effects would likely outweigh potential benefits.  If headaches or ocular migraines become more frequent, she may follow up.   Thank you for allowing me to take part in the care of this patient.  Metta Clines, DO  CC:  Yaakov Guthrie, MD

## 2018-01-10 NOTE — Patient Instructions (Signed)
No follow up needed

## 2018-01-17 ENCOUNTER — Ambulatory Visit: Payer: Medicare Other

## 2018-02-18 ENCOUNTER — Ambulatory Visit
Admission: RE | Admit: 2018-02-18 | Discharge: 2018-02-18 | Disposition: A | Discharge status: Home / Self Care | Payer: Medicare Other | Source: Ambulatory Visit | Attending: Family Medicine | Admitting: Family Medicine

## 2018-02-18 DIAGNOSIS — Z1231 Encounter for screening mammogram for malignant neoplasm of breast: Secondary | ICD-10-CM

## 2018-02-26 ENCOUNTER — Ambulatory Visit: Payer: Medicare Other | Admitting: Gastroenterology

## 2018-02-26 ENCOUNTER — Encounter: Payer: Self-pay | Admitting: Gastroenterology

## 2018-02-26 VITALS — BP 124/74 | HR 68 | Ht 66.0 in | Wt 193.5 lb

## 2018-02-26 DIAGNOSIS — Z1211 Encounter for screening for malignant neoplasm of colon: Secondary | ICD-10-CM

## 2018-02-26 DIAGNOSIS — R110 Nausea: Secondary | ICD-10-CM

## 2018-02-26 DIAGNOSIS — K219 Gastro-esophageal reflux disease without esophagitis: Secondary | ICD-10-CM

## 2018-02-26 DIAGNOSIS — K5901 Slow transit constipation: Secondary | ICD-10-CM | POA: Diagnosis not present

## 2018-02-26 DIAGNOSIS — F1120 Opioid dependence, uncomplicated: Secondary | ICD-10-CM

## 2018-02-26 MED ORDER — LINACLOTIDE 290 MCG PO CAPS: 290.0000 ug | ORAL_CAPSULE | Freq: Every day | ORAL | 2 refills | Status: DC

## 2018-02-26 MED ORDER — NA SULFATE-K SULFATE-MG SULF 17.5-3.13-1.6 GM/177ML PO SOLN: 1.0000 | Freq: Once | ORAL | 0 refills | Status: AC

## 2018-02-26 MED ORDER — NALOXEGOL OXALATE 25 MG PO TABS: 25.0000 mg | ORAL_TABLET | Freq: Every day | ORAL | 2 refills | Status: DC

## 2018-02-26 NOTE — Progress Notes (Signed)
Christine Robles    [id number]    1955-09-06  Primary Care Physician:Wong, Edwyna Shell, MD  Referring Physician: Vernie Shanks, MD [place], [place] [address]  Chief complaint: Nausea  HPI:  62 year old female previously followed by Dr. Olevia Perches, last seen in office July 06, 2014 by Cecille Rubin Hvozdovic here to reestablish care. Complains of nausea almost daily, lasts most of the morning and improves by around lunchtime.  She was recently started on pantoprazole daily and not sure if it is helping improve the symptoms are not yet.  She was previously taking Pepcid AC with no improvement. Patient is on chronic narcotics and is managed by pain management.  She has irregular bowel habits with bowel movement once every 2 to 3 weeks.  She was taking over-the-counter laxatives with no improvement.  She was prescribed Movantik by her pain management doctor but patient did not think it helped her symptoms and currently she is taking Amitiza 24 mcg twice daily with no significant change.  Denies any blood in stool.  No dysphagia, odynophagia, vomiting or abdominal pain.  Colonoscopy January 08, 2006 normal exam EGD July 27, 2014 for dysphagia to solids and liquids. Nausea, epigastric pain.  Showed no evidence of esophageal stricture, empirically dilated with 52 French Maloney dilator.  Otherwise unremarkable exam Upper endoscopy in 2009 showed esophagitis.  She has a history of ?gastroparesis on gastric emptying scan in 2000. GES was normal in 2005.   Outpatient Encounter Medications as of 02/26/2018  Medication Sig  . ALPRAZolam (XANAX) 1 MG tablet Take 0.5 mg by mouth 3 (three) times daily.   Marland Kitchen aspirin EC 325 MG tablet Take 1 tablet (325 mg total) by mouth daily. 1 tab a day for the next 30 days to prevent blood clots (Patient not taking: Reported on 01/10/2018)  . benztropine (COGENTIN) 0.5 MG tablet Take 1 tablet (0.5 mg total) by mouth 2 (two) times daily as needed.  (Patient not taking: Reported on 09/11/2016)  . buPROPion (WELLBUTRIN XL) 300 MG 24 hr tablet Take 300 mg by mouth daily.  Scarlette Shorts SURECLICK 50 MG/ML injection Inject 50 mg into the skin once a week. Mondays  . folic acid (FOLVITE) 1 MG tablet Take 1 mg by mouth 3 (three) times daily.  Marland Kitchen lamoTRIgine (LAMICTAL) 100 MG tablet Take 100 mg by mouth daily.  Marland Kitchen lamoTRIgine (LAMICTAL) 200 MG tablet Take 200 mg by mouth daily.  Marland Kitchen levothyroxine (SYNTHROID, LEVOTHROID) 100 MCG tablet Take 1 tablet (100 mcg total) by mouth daily. Take before breakfast with no other medications  . lisdexamfetamine (VYVANSE) 70 MG capsule Take 70 mg by mouth daily.  . meclizine (ANTIVERT) 25 MG tablet TAKE 1 TABLET 3 TIMES DAILY AS NEEDED FOR DIZZINESS  . Melatonin 10 MG TABS Take 1 tablet by mouth at bedtime as needed.  . Methotrexate, PF, 20 MG/0.4ML SOAJ Inject 0.8 mLs into the skin every 7 (seven) days. SUNDAYS  . metoCLOPramide (REGLAN) 5 MG tablet TAKE 2 TABLETS BY MOUTH THREE TIMES DAILY BEFORE A MEAL (Patient not taking: Reported on 09/11/2016)  . metoCLOPramide (REGLAN) 5 MG tablet TAKE 2 TABLETS BY MOUTH THREE TIMES DAILY BEFORE A MEAL (Patient not taking: Reported on 01/10/2018)  . Multiple Vitamin (MULTIVITAMIN) capsule Take 1 capsule by mouth daily. (Patient not taking: Reported on 01/10/2018)  . omeprazole (PRILOSEC) 20 MG capsule Take 20-40 mg by mouth daily as needed (acid reflux). 1-2 CAPSULES DEPENDING ON  ACID REFLUX  . OVER THE COUNTER MEDICATION Take 5 mg by mouth daily. Fiberwell gummies  . Oxycodone HCl 10 MG TABS TAKE 1 TABLET 5 TIMES DAILY  . pantoprazole (PROTONIX) 40 MG tablet TAKE 1 TABLET BY MOUTH TWICE DAILY (Patient not taking: Reported on 09/11/2016)  . Polyethyl Glycol-Propyl Glycol (SYSTANE OP) Apply 2 drops to eye daily as needed (dry eyes).  . pramipexole (MIRAPEX) 0.5 MG tablet Take 3 tablets by mouth at bedtime.  . promethazine (PHENERGAN) 25 MG tablet Take 1 tablet (25 mg total) by mouth every 6  (six) hours as needed for nausea or vomiting.  . ramelteon (ROZEREM) 8 MG tablet Take 8 mg by mouth at bedtime.  Marland Kitchen tiZANidine (ZANAFLEX) 2 MG tablet Take 2-4 mg by mouth 3 (three) times daily as needed for muscle spasms (depends on pain if takes 1-2 tablets).   . triamterene-hydrochlorothiazide (MAXZIDE-25) 37.5-25 MG per tablet Take 1 each (1 tablet total) by mouth daily.  Marland Kitchen UNABLE TO FIND Take 150 mg by mouth. Med Name: Resistor 3QHS  . UNABLE TO FIND Take 300 mg by mouth at bedtime as needed. Med Name: Hemp extract  . VIIBRYD 40 MG TABS Take 40 mg by mouth daily at 12 noon. AT [place]   No facility-administered encounter medications on file as of 02/26/2018.     Allergies as of 02/26/2018 - Review Complete 01/10/2018  Allergen Reaction Noted  . Escitalopram oxalate Other (See Comments) 01/31/2011  . Buspirone hcl Other (See Comments) 03/08/2009  . Fentanyl Other (See Comments) 03/11/2014  . Morphine and related Other (See Comments) 03/11/2014    Past Medical History:  Diagnosis Date  . ADHD (attention deficit hyperactivity disorder)    "vs Bipolar" (09/26/2016)  . Anxiety   . Bipolar disorder ([place])    "vs ADHD" (09/26/2016)  . Candida esophagitis ([place])   . Chronic lower back pain   . Depression   . Dyspnea   . Esophagitis   . Gastroparesis   . GERD (gastroesophageal reflux disease)   . Headache    "q 4 months now" (09/26/2016)  . Hypertension   . Hypothyroidism   . IBS (irritable bowel syndrome)   . Migraine    "maybe 1-2/year" (09/26/2016)  . OSA on CPAP   . Panic disorder   . Rheumatoid arthritis ([place])    "all over" (09/26/2016)  . Spinal stenosis   . Thyroid disease    hypothyroidism    Past Surgical History:  Procedure Laterality Date  . ABDOMINAL HERNIA REPAIR  02/01/2011   [facility]  . BACK SURGERY    . BREAST BIOPSY Left    15+ years ago no visual scars, 2x  . CARDIAC CATHETERIZATION  2012  . DILATION AND CURETTAGE OF UTERUS    . HERNIA REPAIR    .  KNEE ARTHROSCOPY Left 01/24/2006   hx/notes 10/17/2010  . LAPAROSCOPIC CHOLECYSTECTOMY  05/2001   hx/notes 10/17/2010  . LIPOMA EXCISION Right 12/2001   thigh, hx/notes 10/17/2010  . [place] SURGERY  1990s  . POSTERIOR LUMBAR FUSION  12/2008   "L4, L5, S1"  . TONSILLECTOMY    . TOTAL KNEE ARTHROPLASTY Left 09/26/2016  . TOTAL KNEE ARTHROPLASTY Left 09/26/2016   Procedure: LEFT TOTAL KNEE ARTHROPLASTY;  Surgeon: Ninetta Lights, MD;  Location: [facility];  Service: Orthopedics;  Laterality: Left;  . TUBAL LIGATION      Family History  Problem Relation Age of Onset  . Leukemia Mother 30  . Hypertension Father   .  Heart disease Father   . Cirrhosis Father   . Crohn's disease Father   . Heart disease Brother        pacemaker  . Peripheral Artery Disease Brother   . Heart disease Maternal Grandmother   . Diabetes Brother   . Diabetes Unknown        grandfather  . Colon cancer Neg Hx     Social History   Socioeconomic History  . Marital status: Widowed    Spouse name: Not on file  . Number of children: 1  . Years of education: Not on file  . Highest education level: Some college, no degree  Occupational History  . Occupation: diabled  Social Needs  . Financial resource strain: Not on file  . Food insecurity:    Worry: Not on file    Inability: Not on file  . Transportation needs:    Medical: Not on file    Non-medical: Not on file  Tobacco Use  . Smoking status: Former Smoker    Packs/day: 1.00    Years: 15.00    Pack years: 15.00    Types: Cigarettes    Last attempt to quit: 08/08/1998    Years since quitting: 19.5  . Smokeless tobacco: Never Used  Substance and Sexual Activity  . Alcohol use: No  . Drug use: Yes    Types: Cocaine, Marijuana    Comment: 09/26/2016 "nothing since at least the 1980s"  . Sexual activity: Not Currently  Lifestyle  . Physical activity:    Days per week: Not on file    Minutes per session: Not on file  . Stress: Not on file    Relationships  . Social connections:    Talks on phone: Not on file    Gets together: Not on file    Attends religious service: Not on file    Active member of club or organization: Not on file    Attends meetings of clubs or organizations: Not on file    Relationship status: Not on file  . Intimate partner violence:    Fear of current or ex partner: Not on file    Emotionally abused: Not on file    Physically abused: Not on file    Forced sexual activity: Not on file  Other Topics Concern  . Not on file  Social History Narrative   Patient is right-handed. She lives alone in a one level home. She drinks 1/2 cup of coffee a day. She exercises occasionally on a stationary bike at home.      Review of systems: Review of Systems  Constitutional: Negative for fever and chills.  HENT: Negative.   Eyes: Negative for blurred vision.  Respiratory: Negative for cough, shortness of breath and wheezing.   Cardiovascular: Negative for chest pain and palpitations.  Gastrointestinal: as per HPI Genitourinary: Negative for dysuria, urgency, frequency and hematuria.  Musculoskeletal: Positive for myalgias, back pain and joint pain.  Skin: Negative for itching and rash.  Neurological: Negative for dizziness, tremors, focal weakness, seizures and loss of consciousness.  Endo/Heme/Allergies: Negative for seasonal allergies.  Psychiatric/Behavioral: Negative for depression, suicidal ideas and hallucinations.  All other systems reviewed and are negative.   Physical Exam: Vitals:   02/26/18 0914  BP: 124/74  Pulse: 68   Body mass index is 31.23 kg/m. Gen:      No acute distress HEENT:  EOMI, sclera anicteric Neck:     No masses; no thyromegaly Lungs:    Clear to  auscultation bilaterally; normal respiratory effort CV:         Regular rate and rhythm; no murmurs Abd:      + bowel sounds; soft, non-tender; no palpable masses, no distension Ext:    No edema; adequate peripheral  perfusion Skin:      Warm and dry; no rash Neuro: alert and oriented x 3 Psych: normal mood and affect  Data Reviewed:  Reviewed labs, radiology imaging, old records and pertinent past GI work up   Assessment and Plan/Recommendations:  62 year old female with history of chronic narcotic use, hypothyroidism, chronic GERD and chronic constipation here to reestablish care Nausea secondary to worsening constipation versus narcotic bowel versus GERD Continue Protonix daily Antireflux measures Small frequent meals Start Movantik 25 mg daily Stop Amitiza and switch to Linzess  290 mcg daily Crease dietary fiber and fluid intake Patient is past due for colorectal cancer screening, will schedule colonoscopy The risks and benefits as well as alternatives of endoscopic procedure(s) have been discussed and reviewed. All questions answered. The patient agrees to proceed.     Damaris Hippo , MD [phone]    CC: Vernie Shanks, MD

## 2018-02-26 NOTE — Patient Instructions (Addendum)
You have been scheduled for a colonoscopy. Please follow written instructions given to you at your visit today.  Please pick up your prep supplies at the pharmacy within the next 1-3 days. If you use inhalers (even only as needed), please bring them with you on the day of your procedure.   We will send Linzess 290 mcg to your pharmacy  Continue Movantik  Stop Amitiza  Continue Protonix  If you are age 64 or older, your body mass index should be between 23-30. Your Body mass index is 31.23 kg/m. If this is out of the aforementioned range listed, please consider follow up with your Primary Care Provider.  If you are age 38 or younger, your body mass index should be between 19-25. Your Body mass index is 31.23 kg/m. If this is out of the aformentioned range listed, please consider follow up with your Primary Care Provider.    Thank you for choosing [facility]  Karleen Hampshire Nand[place]am,MD

## 2018-03-04 ENCOUNTER — Encounter: Payer: Self-pay | Admitting: Gastroenterology

## 2018-03-10 ENCOUNTER — Telehealth: Payer: Self-pay | Admitting: Gastroenterology

## 2018-03-10 NOTE — Telephone Encounter (Signed)
Patient did not want prep  Kit she already has her prep kit. She wants forms filled out for [facility] patient assistance program I explained to her to have the copy of her income and fill out her part on the forms and bring up here and drop off with [place] so we can fill out and Dr Silverio Decamp can sign her part

## 2018-03-18 ENCOUNTER — Encounter: Payer: Self-pay | Admitting: Gastroenterology

## 2018-03-18 ENCOUNTER — Ambulatory Visit (AMBULATORY_SURGERY_CENTER): Payer: Medicare Other | Admitting: Gastroenterology

## 2018-03-18 VITALS — BP 128/76 | HR 64 | Temp 99.1°F | Resp 18 | Ht 66.0 in | Wt 193.0 lb

## 2018-03-18 DIAGNOSIS — D123 Benign neoplasm of transverse colon: Secondary | ICD-10-CM | POA: Diagnosis not present

## 2018-03-18 DIAGNOSIS — Z1211 Encounter for screening for malignant neoplasm of colon: Secondary | ICD-10-CM | POA: Diagnosis not present

## 2018-03-18 DIAGNOSIS — D120 Benign neoplasm of cecum: Secondary | ICD-10-CM | POA: Diagnosis not present

## 2018-03-18 DIAGNOSIS — K5901 Slow transit constipation: Secondary | ICD-10-CM | POA: Insufficient documentation

## 2018-03-18 DIAGNOSIS — K635 Polyp of colon: Secondary | ICD-10-CM

## 2018-03-18 DIAGNOSIS — D127 Benign neoplasm of rectosigmoid junction: Secondary | ICD-10-CM

## 2018-03-18 MED ORDER — SODIUM CHLORIDE 0.9 % IV SOLN: 500.0000 mL | Freq: Once | INTRAVENOUS | Status: DC

## 2018-03-18 NOTE — Progress Notes (Signed)
To PACU, VSS. Report to [email or web address] 

## 2018-03-18 NOTE — Progress Notes (Signed)
Pt. Reports no change in her medical or surgical history since her pre-visit 02/26/2018.

## 2018-03-18 NOTE — Patient Instructions (Signed)
YOU HAD AN ENDOSCOPIC PROCEDURE TODAY AT THE [facility]:   Refer to the procedure report that was given to you for any specific questions about what was found during the examination.  If the procedure report does not answer your questions, please call your gastroenterologist to clarify.  If you requested that your care partner not be given the details of your procedure findings, then the procedure report has been included in a sealed envelope for you to review at your convenience later.  YOU SHOULD EXPECT: Some feelings of bloating in the abdomen. Passage of more gas than usual.  Walking can help get rid of the air that was put into your GI tract during the procedure and reduce the bloating. If you had a lower endoscopy (such as a colonoscopy or flexible sigmoidoscopy) you may notice spotting of blood in your stool or on the toilet paper. If you underwent a bowel prep for your procedure, you may not have a normal bowel movement for a few days.  Please Note:  You might notice some irritation and congestion in your nose or some drainage.  This is from the oxygen used during your procedure.  There is no need for concern and it should clear up in a day or so.  SYMPTOMS TO REPORT IMMEDIATELY:   Following lower endoscopy (colonoscopy or flexible sigmoidoscopy):  Excessive amounts of blood in the stool  Significant tenderness or worsening of abdominal pains  Swelling of the abdomen that is new, acute  Fever of 100F or higher   For urgent or emergent issues, a gastroenterologist can be reached at any hour by calling [phone].   DIET:  We do recommend a small meal at first, but then you may proceed to your regular diet.  Drink plenty of fluids but you should avoid alcoholic beverages for 24 hours.  ACTIVITY:  You should plan to take it easy for the rest of today and you should NOT DRIVE or use heavy machinery until tomorrow (because of the sedation medicines used during the test).     FOLLOW UP: Our staff will call the number listed on your records the next business day following your procedure to check on you and address any questions or concerns that you may have regarding the information given to you following your procedure. If we do not reach you, we will leave a message.  However, if you are feeling well and you are not experiencing any problems, there is no need to return our call.  We will assume that you have returned to your regular daily activities without incident.  If any biopsies were taken you will be contacted by phone or by letter within the next 1-3 weeks.  Please call us at [phone] if you have not heard about the biopsies in 3 weeks.    SIGNATURES/CONFIDENTIALITY: You and/or your care partner have signed paperwork which will be entered into your electronic medical record.  These signatures attest to the fact that that the information above on your After Visit Summary has been reviewed and is understood.  Full responsibility of the confidentiality of this discharge information lies with you and/or your care-partner.  Read all of the handouts given to you by your recovery room nurse. 

## 2018-03-18 NOTE — Op Note (Signed)
[facility] Patient Name: Christine Robles Procedure Date: 03/18/2018 9:36 AM MRN: [id number] Endoscopist: Mauri Pole , MD Age: 62 Referring MD:  Date of Birth: [birth year] Gender: Female Account #: [id number] Procedure:                Colonoscopy Indications:              Screening for colorectal malignant neoplasm Medicines:                Monitored Anesthesia Care Procedure:                Pre-Anesthesia Assessment:                           - Prior to the procedure, a History and Physical                            was performed, and patient medications and                            allergies were reviewed. The patient's tolerance of                            previous anesthesia was also reviewed. The risks                            and benefits of the procedure and the sedation                            options and risks were discussed with the patient.                            All questions were answered, and informed consent                            was obtained. Prior Anticoagulants: The patient has                            taken no previous anticoagulant or antiplatelet                            agents. ASA Grade Assessment: II - A patient with                            mild systemic disease. After reviewing the risks                            and benefits, the patient was deemed in                            satisfactory condition to undergo the procedure.                           After obtaining informed consent, the colonoscope  was passed under direct vision. Throughout the                            procedure, the patient's blood pressure, pulse, and                            oxygen saturations were monitored continuously. The                            Colonoscope was introduced through the anus and                            advanced to the the cecum, identified by                            appendiceal orifice  and ileocecal valve. The                            colonoscopy was performed without difficulty. The                            patient tolerated the procedure well. The quality                            of the bowel preparation was excellent. The                            ileocecal valve, appendiceal orifice, and rectum                            were photographed. Scope In: 9:37:06 AM Scope Out: 9:59:57 AM Scope Withdrawal Time: 0 hours 18 minutes 58 seconds  Total Procedure Duration: 0 hours 22 minutes 51 seconds  Findings:                 The perianal and digital rectal examinations were                            normal.                           A 2 mm polyp was found in the cecum. The polyp was                            sessile. The polyp was removed with a cold biopsy                            forceps. Resection and retrieval were complete.                           Two sessile polyps were found in the recto-sigmoid                            colon and transverse colon. The polyps were 5 to 6  mm in size. These polyps were removed with a cold                            snare. Resection and retrieval were complete.                           Non-bleeding internal hemorrhoids were found during                            retroflexion. The hemorrhoids were medium-sized. Complications:            No immediate complications. Estimated Blood Loss:     Estimated blood loss was minimal. Impression:               - One 2 mm polyp in the cecum, removed with a cold                            biopsy forceps. Resected and retrieved.                           - Two 5 to 6 mm polyps at the recto-sigmoid colon                            and in the transverse colon, removed with a cold                            snare. Resected and retrieved.                           - Non-bleeding internal hemorrhoids. Recommendation:           - Patient has a contact number  available for                            emergencies. The signs and symptoms of potential                            delayed complications were discussed with the                            patient. Return to normal activities tomorrow.                            Written discharge instructions were provided to the                            patient.                           - Resume previous diet.                           - Continue present medications.                           - Await pathology results.                           -  Repeat colonoscopy in 5-10 years for surveillance                            based on pathology results. Mauri Pole, MD 03/18/2018 10:04:38 AM This report has been signed electronically.

## 2018-03-18 NOTE — Progress Notes (Signed)
Called to room to assist during endoscopic procedure.  Patient ID and intended procedure confirmed with present staff. Received instructions for my participation in the procedure from the performing physician.  

## 2018-03-19 ENCOUNTER — Telehealth: Payer: Self-pay | Admitting: *Deleted

## 2018-03-19 ENCOUNTER — Telehealth: Payer: Self-pay

## 2018-03-19 NOTE — Telephone Encounter (Signed)
  Follow up Call-  Call back number 03/18/2018  Post procedure Call Back phone  # [phone]  Permission to leave phone message Yes  Some recent data might be hidden     Patient questions:  Do you have a fever, pain , or abdominal swelling? No. Pain Score  0 *  Have you tolerated food without any problems? Yes.    Have you been able to return to your normal activities? Yes.    Do you have any questions about your discharge instructions: Diet   No. Medications  No. Follow up visit  No.  Do you have questions or concerns about your Care? No.  Actions: * If pain score is 4 or above: No action needed, pain <4.

## 2018-03-19 NOTE — Telephone Encounter (Signed)
No answer, left message to call if questions or concern.

## 2018-03-25 ENCOUNTER — Emergency Department (HOSPITAL_COMMUNITY)
Admission: EM | Admit: 2018-03-25 | Discharge: 2018-03-25 | Disposition: A | Discharge status: Home / Self Care | Payer: Medicare Other | Attending: Emergency Medicine | Admitting: Emergency Medicine

## 2018-03-25 ENCOUNTER — Encounter (HOSPITAL_COMMUNITY): Payer: Self-pay | Admitting: Emergency Medicine

## 2018-03-25 ENCOUNTER — Emergency Department (HOSPITAL_COMMUNITY): Payer: Medicare Other

## 2018-03-25 ENCOUNTER — Other Ambulatory Visit: Payer: Self-pay

## 2018-03-25 DIAGNOSIS — R109 Unspecified abdominal pain: Secondary | ICD-10-CM | POA: Diagnosis not present

## 2018-03-25 DIAGNOSIS — Z79899 Other long term (current) drug therapy: Secondary | ICD-10-CM | POA: Diagnosis not present

## 2018-03-25 DIAGNOSIS — R112 Nausea with vomiting, unspecified: Secondary | ICD-10-CM | POA: Diagnosis present

## 2018-03-25 DIAGNOSIS — Z96652 Presence of left artificial knee joint: Secondary | ICD-10-CM | POA: Insufficient documentation

## 2018-03-25 DIAGNOSIS — F909 Attention-deficit hyperactivity disorder, unspecified type: Secondary | ICD-10-CM | POA: Insufficient documentation

## 2018-03-25 DIAGNOSIS — E039 Hypothyroidism, unspecified: Secondary | ICD-10-CM | POA: Insufficient documentation

## 2018-03-25 DIAGNOSIS — I10 Essential (primary) hypertension: Secondary | ICD-10-CM | POA: Diagnosis not present

## 2018-03-25 DIAGNOSIS — G43D0 Abdominal migraine, not intractable: Secondary | ICD-10-CM | POA: Insufficient documentation

## 2018-03-25 DIAGNOSIS — Z87891 Personal history of nicotine dependence: Secondary | ICD-10-CM | POA: Insufficient documentation

## 2018-03-25 LAB — COMPREHENSIVE METABOLIC PANEL
ALT: 15 U/L (ref 0–44)
ANION GAP: 7 (ref 5–15)
AST: 14 U/L — ABNORMAL LOW (ref 15–41)
Albumin: 3.9 g/dL (ref 3.5–5.0)
Alkaline Phosphatase: 60 U/L (ref 38–126)
BUN: 12 mg/dL (ref 8–23)
CO2: 24 mmol/L (ref 22–32)
CREATININE: 1.05 mg/dL — AB (ref 0.44–1.00)
Calcium: 9.8 mg/dL (ref 8.9–10.3)
Chloride: 101 mmol/L (ref 98–111)
GFR, EST NON AFRICAN AMERICAN: 55 mL/min — AB (ref >60)
Glucose, Bld: 108 mg/dL — ABNORMAL HIGH (ref 70–99)
POTASSIUM: 3.8 mmol/L (ref 3.5–5.1)
Sodium: 132 mmol/L — ABNORMAL LOW (ref 135–145)
Total Bilirubin: 0.6 mg/dL (ref 0.3–1.2)
Total Protein: 7 g/dL (ref 6.5–8.1)

## 2018-03-25 LAB — URINALYSIS, ROUTINE W REFLEX MICROSCOPIC
BILIRUBIN URINE: NEGATIVE
Glucose, UA: NEGATIVE mg/dL
Ketones, ur: 5 mg/dL — AB
NITRITE: NEGATIVE
PH: 8 (ref 5.0–8.0)
Protein, ur: NEGATIVE mg/dL

## 2018-03-25 LAB — CBC
HEMATOCRIT: 44.3 % (ref 36.0–46.0)
Hemoglobin: 14.3 g/dL (ref 12.0–15.0)
MCH: 28.5 pg (ref 26.0–34.0)
MCHC: 32.3 g/dL (ref 30.0–36.0)
MCV: 88.4 fL (ref 80.0–100.0)
NRBC: 0 % (ref 0.0–0.2)
Platelets: 406 10*3/uL — ABNORMAL HIGH (ref 150–400)
RBC: 5.01 MIL/uL (ref 3.87–5.11)
RDW: 12.7 % (ref 11.5–15.5)
WBC: 9.6 10*3/uL (ref 4.0–10.5)

## 2018-03-25 LAB — LIPASE, BLOOD: LIPASE: 27 U/L (ref 11–51)

## 2018-03-25 MED ORDER — KETOROLAC TROMETHAMINE 15 MG/ML IJ SOLN
15.0000 mg | Freq: Once | INTRAMUSCULAR | Status: AC
  Administered 2018-03-25: 15 mg via INTRAVENOUS
  Filled 2018-03-25: qty 1

## 2018-03-25 MED ORDER — IOHEXOL 300 MG/ML  SOLN
100.0000 mL | Freq: Once | INTRAMUSCULAR | Status: AC | PRN
  Administered 2018-03-25: 100 mL via INTRAVENOUS

## 2018-03-25 MED ORDER — DIPHENHYDRAMINE HCL 50 MG/ML IJ SOLN
12.5000 mg | Freq: Once | INTRAMUSCULAR | Status: AC
  Administered 2018-03-25: 12.5 mg via INTRAVENOUS
  Filled 2018-03-25: qty 1

## 2018-03-25 MED ORDER — METOCLOPRAMIDE HCL 5 MG/ML IJ SOLN
10.0000 mg | Freq: Once | INTRAMUSCULAR | Status: AC
  Administered 2018-03-25: 10 mg via INTRAVENOUS
  Filled 2018-03-25: qty 2

## 2018-03-25 MED ORDER — METOCLOPRAMIDE HCL 10 MG PO TABS: 10.0000 mg | ORAL_TABLET | Freq: Three times a day (TID) | ORAL | 0 refills | Status: AC | PRN

## 2018-03-25 MED ORDER — SODIUM CHLORIDE 0.9 % IV BOLUS
1000.0000 mL | Freq: Once | INTRAVENOUS | Status: AC
  Administered 2018-03-25: 1000 mL via INTRAVENOUS

## 2018-03-25 NOTE — ED Notes (Signed)
Pt ambulatory to the restroom.  

## 2018-03-25 NOTE — ED Notes (Addendum)
Pt received zofran in route. She is dry heaving.

## 2018-03-25 NOTE — ED Provider Notes (Signed)
[facility] EMERGENCY DEPARTMENT Provider Note   CSN: [id number] Arrival date & time: 03/25/18  1231     History   Chief Complaint Chief Complaint  Patient presents with  . Emesis  . Nausea    HPI Christine Robles is a 62 y.o. female.  HPI  Patient is a 62 year old female with a past medical history of migraine headaches, GERD, chronic lower back pain on opioids, panic disorder, and hypothyroidism who presents emerged department for evaluation of nausea, vomiting, and headache for the past day.  Patient reports that her headache started last night.  It felt similar to her previous migraines but not as severe.  Upon awakening this morning patient states that she has had 3 episodes of nausea with vomiting.  States that she has had worsening of her symptoms since the onset.  She describes the headache is holocephalic, gradual in onset, worsening, and nonradiating.  It is very similar to her previous migraines.  In regards to her abdominal pain, she reports that it is diffuse, dull and achy, worsened with retching, and not improved with Zofran or fluids.  In addition to the above-mentioned symptoms, patient also endorses photophobia.  She denies any chest pain, shortness breath, vision changes, head trauma, obstipation, bloody stools, hematemesis, or syncope.   Past Medical History:  Diagnosis Date  . ADHD (attention deficit hyperactivity disorder)    "vs Bipolar" (09/26/2016)  . Anxiety   . Bipolar disorder ([place])    "vs ADHD" (09/26/2016)  . Candida esophagitis ([place])   . Chronic lower back pain   . Depression   . Dyspnea   . Esophagitis   . Gastroparesis   . GERD (gastroesophageal reflux disease)   . Headache    "q 4 months now" (09/26/2016)  . Hypertension   . Hypothyroidism   . IBS (irritable bowel syndrome)   . Migraine    "maybe 1-2/year" (09/26/2016)  . OSA on CPAP   . Panic disorder   . Rheumatoid arthritis ([place])    "all over" (09/26/2016)  . Spinal  stenosis   . Thyroid disease    hypothyroidism    Patient Active Problem List   Diagnosis Date Noted  . Slow transit constipation 03/18/2018  . Primary localized osteoarthritis of left knee 09/26/2016  . Streptococcal sore throat 08/10/2012    Class: Acute  . Hypothyroidism 03/08/2009  . PANIC DISORDER 03/08/2009  . DEPRESSION 03/08/2009  . Essential hypertension 03/08/2009  . GERD 03/08/2009  . SPINAL STENOSIS 03/08/2009  . IRRITABLE BOWEL SYNDROME, HX OF 03/08/2009    Past Surgical History:  Procedure Laterality Date  . ABDOMINAL HERNIA REPAIR  02/01/2011   [facility]  . BACK SURGERY    . BREAST BIOPSY Left    15+ years ago no visual scars, 2x  . CARDIAC CATHETERIZATION  2012  . DILATION AND CURETTAGE OF UTERUS    . HERNIA REPAIR    . KNEE ARTHROSCOPY Left 01/24/2006   hx/notes 10/17/2010  . LAPAROSCOPIC CHOLECYSTECTOMY  05/2001   hx/notes 10/17/2010  . LIPOMA EXCISION Right 12/2001   thigh, hx/notes 10/17/2010  . [facility] SURGERY  1990s  . POSTERIOR LUMBAR FUSION  12/2008   "L4, L5, S1"  . TONSILLECTOMY    . TOTAL KNEE ARTHROPLASTY Left 09/26/2016  . TOTAL KNEE ARTHROPLASTY Left 09/26/2016   Procedure: LEFT TOTAL KNEE ARTHROPLASTY;  Surgeon: Ninetta Lights, MD;  Location: [facility];  Service: Orthopedics;  Laterality: Left;  . TUBAL LIGATION  OB History   None      Home Medications    Prior to Admission medications   Medication Sig Start Date End Date Taking? Authorizing Provider  ALPRAZolam Duanne Moron) 1 MG tablet Take 0.5 mg by mouth 3 (three) times daily.     [provider]  atorvastatin (LIPITOR) 10 MG tablet Take 10 mg by mouth daily.    [provider]  buPROPion (WELLBUTRIN XL) 300 MG 24 hr tablet Take 300 mg by mouth daily. 12/09/17   [provider]  Cholecalciferol (VITAMIN D3) 50000 units CAPS TK 1 C PO Q WEEK 02/11/18   [provider]  ENBREL SURECLICK 50 MG/ML injection Inject 50 mg into the skin once a week. Mondays  08/01/16   [provider]  lamoTRIgine (LAMICTAL) 200 MG tablet Take 200 mg by mouth daily. 12/09/17   [provider]  levothyroxine (SYNTHROID, LEVOTHROID) 112 MCG tablet TK 1 T PO QD IN THE MORNING OES 02/25/18   [provider]  linaclotide (LINZESS) 290 MCG CAPS capsule Take 1 capsule (290 mcg total) by mouth daily before breakfast. 02/26/18   Nandigam, Venia Minks, MD  Melatonin 10 MG TABS Take 1 tablet by mouth at bedtime as needed.    [provider]  metoCLOPramide (REGLAN) 10 MG tablet Take 1 tablet (10 mg total) by mouth every 8 (eight) hours as needed for nausea. 03/25/18   Tobie Perdue, Chanda Busing, MD  naloxegol oxalate (MOVANTIK) 25 MG TABS tablet Take 1 tablet (25 mg total) by mouth daily. 02/26/18   Mauri Pole, MD  OVER THE COUNTER MEDICATION Take 5 mg by mouth daily. Fiberwell gummies    [provider]  Oxycodone HCl 10 MG TABS TAKE 1 TABLET 5 TIMES DAILY 08/07/16   [provider]  pantoprazole (PROTONIX) 40 MG tablet TAKE 1 TABLET BY MOUTH TWICE DAILY 09/20/14   Lafayette Dragon, MD  pramipexole (MIRAPEX) 0.5 MG tablet Take 1 tablet by mouth at bedtime.  12/09/17   [provider]  RELISTOR 150 MG TABS TK 3 TS PO QD 03/16/18   [provider]  tiZANidine (ZANAFLEX) 2 MG tablet Take 2-4 mg by mouth 3 (three) times daily as needed for muscle spasms (depends on pain if takes 1-2 tablets).     [provider]  triamterene-hydrochlorothiazide (MAXZIDE-25) 37.5-25 MG per tablet Take 1 each (1 tablet total) by mouth daily. 08/14/12   Patrecia Pour, NP  UNABLE TO FIND Take 300 mg by mouth at bedtime as needed. Med Name: Hemp extract    [provider]  Vitamin D, Ergocalciferol, (DRISDOL) 50000 units CAPS capsule Take 50,000 Units by mouth every 7 (seven) days.    [provider]    Family History Family History  Problem Relation Age of Onset  . Leukemia Mother 34  . Hypertension Father   .  Heart disease Father   . Cirrhosis Father   . Crohn's disease Father   . Heart disease Brother        pacemaker  . Peripheral Artery Disease Brother   . Heart disease Maternal Grandmother   . Diabetes Brother   . Diabetes Unknown        grandfather  . Colon cancer Neg Hx     Social History Social History   Tobacco Use  . Smoking status: Former Smoker    Packs/day: 1.00    Years: 15.00    Pack years: 15.00    Types: Cigarettes  Last attempt to quit: 08/08/1998    Years since quitting: 19.6  . Smokeless tobacco: Never Used  Substance Use Topics  . Alcohol use: No  . Drug use: Yes    Types: Cocaine, Marijuana    Comment: 09/26/2016 "nothing since at least the 1980s"     Allergies   Escitalopram oxalate; Buspirone hcl; Fentanyl; and Morphine and related   Review of Systems Review of Systems  Constitutional: Positive for chills and fatigue. Negative for fever.  HENT: Negative for ear pain and sore throat.   Eyes: Positive for photophobia. Negative for pain and visual disturbance.  Respiratory: Negative for cough and shortness of breath.   Cardiovascular: Negative for chest pain and palpitations.  Gastrointestinal: Positive for abdominal pain, nausea and vomiting.  Genitourinary: Negative for dysuria and hematuria.  Musculoskeletal: Negative for arthralgias and back pain.  Skin: Negative for color change and rash.  Neurological: Positive for headaches. Negative for seizures and syncope.  All other systems reviewed and are negative.    Physical Exam Updated Vital Signs BP 126/64   Pulse 74   Temp (!) 97 F (36.1 C) (Rectal)   Resp 15   LMP 08/08/1994   SpO2 97%   Physical Exam  Constitutional: She appears well-developed and well-nourished.  Some rigors during initial exam. Patient appears uncomfortable.   HENT:  Head: Normocephalic and atraumatic.  Eyes: Conjunctivae are normal.  Neck: Neck supple.  Cardiovascular: Normal rate and regular rhythm.    Pulmonary/Chest: Effort normal and breath sounds normal. No respiratory distress.  Abdominal: Soft. There is tenderness (Epigastric).  Musculoskeletal: She exhibits no edema.  Neurological: She is alert.  Skin: Skin is warm and dry.  Psychiatric: She has a normal mood and affect.  Nursing note and vitals reviewed.    ED Treatments / Results  Labs (all labs ordered are listed, but only abnormal results are displayed) Labs Reviewed  COMPREHENSIVE METABOLIC PANEL - Abnormal; Notable for the following components:      Result Value   Sodium 132 (*)    Glucose, Bld 108 (*)    Creatinine, Ser 1.05 (*)    AST 14 (*)    GFR calc non Af Amer 55 (*)    All other components within normal limits  CBC - Abnormal; Notable for the following components:   Platelets 406 (*)    All other components within normal limits  URINALYSIS, ROUTINE W REFLEX MICROSCOPIC - Abnormal; Notable for the following components:   APPearance CLOUDY (*)    Specific Gravity, Urine >1.046 (*)    Hgb urine dipstick SMALL (*)    Ketones, ur 5 (*)    Leukocytes, UA LARGE (*)    Bacteria, UA MANY (*)    All other components within normal limits  URINE CULTURE  LIPASE, BLOOD    EKG None  Radiology Ct Abdomen Pelvis W Contrast  Result Date: 03/25/2018 CLINICAL DATA:  62 year old female with nausea and vomiting. Post cholecystectomy and abdominal hernia repair. Initial encounter. EXAM: CT ABDOMEN AND PELVIS WITH CONTRAST TECHNIQUE: Multidetector CT imaging of the abdomen and pelvis was performed using the standard protocol following bolus administration of intravenous contrast. CONTRAST:  110mL OMNIPAQUE IOHEXOL 300 MG/ML  SOLN COMPARISON:  01/27/2011 CT and 10/14/2010 MR. FINDINGS: Lower chest: Right lower lobe pleural based nodular densities unchanged. Scattered areas of scarring/subsegmental atelectasis. Heart size within normal limits. Hepatobiliary: Five hepatic hemangiomas better visualized on prior MR. Portions  which are visualized do not appear significantly different. No new  worrisome hepatic lesion. Post cholecystectomy. Common bile duct caliber normal for post cholecystectomy state. No calcified common bile duct stone or obstructing lesion. Pancreas: No worrisome pancreatic mass or inflammation. Spleen: No splenic mass or enlargement. Adrenals/Urinary Tract: No obstructing stone or hydronephrosis. No worrisome renal or adrenal lesion. Noncontrast filled views the urinary bladder unremarkable. Stomach/Bowel: No extraluminal bowel inflammatory process. Appendix not visualized and may been removed as radiopaque material seen near the cecal tip. Fluid-filled cecum of indeterminate etiology or significance. Stomach is under distended and evaluation limited. No gross abnormality detected. Vascular/Lymphatic: Atherosclerotic changes aorta and aortic branch vessels. No abdominal aortic aneurysm or large vessel occlusion. Scattered normal size lymph nodes. Reproductive: No worrisome uterine or adnexal lesion. Other: No free air or bowel containing hernia. No drainable fluid collection. Supraumbilical fat and vessel containing hernia with rent measuring 7 mm and contents measuring 2 cm. Musculoskeletal: Scoliosis lumbar spine convex left. Prior fusion L4-5 and L5-S1. Moderate degenerative changes L1-2 with mild moderate degenerative changes L2-3 and L3-4. Mild loss of height superior T11 endplate appears remote. No acute osseous abnormality. IMPRESSION: 1. No extraluminal bowel inflammatory process. Appendix not visualized and may have been removed as radiopaque material seen near the cecal tip. Fluid-filled cecum of indeterminate etiology or significance. 2. Stomach is under distended and evaluation limited. No gross abnormality detected. 3. No obstructing stone or hydronephrosis. 4. Five hepatic hemangiomas better visualized on prior MR. 5. Supraumbilical fat and vessel containing hernia with rent measuring 7 mm and contents  measuring 2 cm. 6. Scoliosis lumbar spine convex left. Prior fusion L4-5 and L5-S1. Moderate degenerative changes L1-2 with mild to moderate degenerative changes L2-3 and L3-4. 7.  Aortic Atherosclerosis (ICD10-I70.0). Electronically Signed   By: Genia Del M.D.   On: 03/25/2018 18:41    Procedures Procedures (including critical care time)  Medications Ordered in ED Medications  metoCLOPramide (REGLAN) injection 10 mg (10 mg Intravenous Given 03/25/18 1653)  diphenhydrAMINE (BENADRYL) injection 12.5 mg (12.5 mg Intravenous Given 03/25/18 1653)  ketorolac (TORADOL) 15 MG/ML injection 15 mg (15 mg Intravenous Given 03/25/18 1653)  sodium chloride 0.9 % bolus 1,000 mL (0 mLs Intravenous Stopped 03/25/18 1759)  iohexol (OMNIPAQUE) 300 MG/ML solution 100 mL (100 mLs Intravenous Contrast Given 03/25/18 1726)     Initial Impression / Assessment and Plan / ED Course  I have reviewed the triage vital signs and the nursing notes.  Pertinent labs & imaging results that were available during my care of the patient were reviewed by me and considered in my medical decision making (see chart for details).     Patient is a 62 year old female with past medical history as detailed above who presents to the emergency department for evaluation of headache, nausea, vomiting, and abdominal pain.  Upon arrival patient does have rigors and looks significant discomfort.  As a result laboratory and imaging studies were obtained.  Patient's labs are overall reassuring.  Her UA does have bacteria, but epithelial cells are also present and as result we will not treat this at this time and will send the urine for culture.  Patient's abdominal CT does not reveal an acute process that would be responsible for patient's presenting symptoms.  Given patient's history of migraine headaches a migraine cocktail was given to the patient with complete resolution of her symptoms.  After receiving the migraine cocktail patient was  ambulatory, nontoxic in appearance, and had no further symptoms.  As a result patient is appropriate for discharge at this time.  I will have patient follow-up with her primary care physician in the next few days reevaluation.  At this time, I feel the patient's most likely examination for symptoms is an abdominal migraine.  There is no CT evidence of obstruction, perforation, infection, or mass that would explain patient's symptoms.  Her neurological exam is overall reassuring making intracranial process is less likely at this time.  Patient given strict return precautions to come back to this emergency department if her symptoms should acutely worsen.  She was given a prescription for Reglan.  Patient in no acute distress at time of discharge.  The care of this patient was discussed with my attending physician Dr. Dayna Barker, who voices agreement with work-up and ED disposition.  Final Clinical Impressions(s) / ED Diagnoses   Final diagnoses:  Abdominal migraine, not intractable    ED Discharge Orders         Ordered    metoCLOPramide (REGLAN) 10 MG tablet  Every 8 hours PRN     03/25/18 2037           Melina Copa, MD 03/26/18 2116    Merrily Pew, MD 03/26/18 2306

## 2018-03-25 NOTE — ED Notes (Signed)
Pt returned from CT °

## 2018-03-25 NOTE — ED Notes (Signed)
Pt urinated prior to going to CT.  Urine was discarded due to not being a clean sample.  Will attempt to get urine later.

## 2018-03-25 NOTE — ED Triage Notes (Signed)
Patient presents to the ED by EMS with c/o nausea and vomiting. She was seen by PCP who rx meds for symptoms but told her to her to come to ED if symptoms persisted. She has vomited 3x today. No blood noted. Recent Endoscopy. Received 4 MG of zofran in route.

## 2018-03-25 NOTE — ED Notes (Signed)
Patient transported to CT 

## 2018-03-25 NOTE — ED Notes (Signed)
ED Provider at bedside. 

## 2018-03-25 NOTE — ED Notes (Signed)
Patient Alert and oriented to baseline. Stable and ambulatory to baseline. Patient verbalized understanding of the discharge instructions.  Patient belongings were taken by the patient.   

## 2018-03-27 ENCOUNTER — Telehealth: Payer: Self-pay | Admitting: Gastroenterology

## 2018-03-27 LAB — URINE CULTURE

## 2018-03-27 NOTE — Telephone Encounter (Signed)
Patient states she has been vomiting and thinks it is GI related. Patient requesting to speak with the nurse. Pt last seen 10.15.19

## 2018-03-28 NOTE — Telephone Encounter (Signed)
Please advise patient to use Dulcolax suppository, continue Linzess 290 mcg daily.  Also start Movantik 25 mg daily if she is not having a bowel movement for more than a week.  Do not recommend long-term use of Reglan.  Small frequent meals.  Increase water intake to 60 to 80 ounces daily

## 2018-03-28 NOTE — Telephone Encounter (Signed)
Patient calls with complaints of ongoing nausea. She does not have medications for this. She states she does not have Reglan. (On the med list). She has not had a bowel movement since her procedure "or at least 5 days." She is on Linzess. Not on Movantik. She eaten and retained toast this morning. She has separated her medications into 2 separate dosing through the day. Trying to not take so many medications at one time.  Please advise.

## 2018-03-28 NOTE — Telephone Encounter (Signed)
Patient is advised. Will follow up on Monday 03/31/18. Check for Movantik samples.

## 2018-03-31 ENCOUNTER — Encounter: Payer: Self-pay | Admitting: Gastroenterology

## 2018-04-01 ENCOUNTER — Encounter: Payer: Self-pay | Admitting: Physician Assistant

## 2018-04-01 ENCOUNTER — Ambulatory Visit: Payer: Medicare Other | Admitting: Physician Assistant

## 2018-04-01 VITALS — BP 102/70 | HR 76 | Ht 66.0 in | Wt 193.6 lb

## 2018-04-01 DIAGNOSIS — R110 Nausea: Secondary | ICD-10-CM

## 2018-04-01 DIAGNOSIS — K5900 Constipation, unspecified: Secondary | ICD-10-CM | POA: Diagnosis not present

## 2018-04-01 NOTE — Progress Notes (Signed)
Chief Complaint: Vomiting  HPI:    Christine Robles is a 62 year old female with a past medical history as listed below, known to Dr. Silverio Decamp, who presents to clinic today with a complaint of vomiting.      02/26/2018 patient seen by Dr. Silverio Decamp for nausea.  At that time described almost daily nausea which lasted most of the morning improved but around lunchtime.  She was recently started on pantoprazole but was not sure if it was helping.  Also on chronic narcotics managed by pain management with a regular bowel habits and bowel movements once every 2 to 3 weeks.  At that time it was thought patient's nausea was secondary to worsening constipation versus narcotic bowel versus GERD.  She was continued on Protonix daily, started on Movantik 25 mg daily and told to stop Amitiza and switch to Linzess 290 mcg daily.  She is scheduled for colonoscopy.    03/18/2018 colonoscopy with Dr. Silverio Decamp with 1 2 mm polyp in the cecum, 2 5-6 mm polyps at the rectosigmoid colon and in the transverse colon and nonbleeding internal hemorrhoids.  Pathology showed adenomatous polyps and repeat was recommended in 5 years.    03/25/2018 seen in the ED for nausea and vomiting with a headache.  CBC and CMP as well as lipase normal.  CT abdomen pelvis with contrast showed no extraluminal bowel inflammatory process.  Appendix not visualized.  Fluid-filled cecum with indeterminate etiology or significance.  Stomach is underdistended and evaluation limited.  No obstructing stone or hydronephrosis, 5 hepatic hemangiomas.  Supraumbilical fat and vessel containing hernia.  Scoliosis lumbar spine.  Aortic atherosclerosis.  Patient received a migraine cocktail while in the ER and had complete resolution of her symptoms.  Was thought she had an abdominal migraine.    03/27/2018 patient's called and reported vomiting.  Dr. Silverio Decamp recommended she is a Dulcolax suppository and continue Linzess 290 mcg daily and also start Movantik 25 mg daily  if she is not having a bowel movement for more than a week.    Today, the patient expresses that she is using Linzess 290 mcg daily and this is not helping at all.  She has only had one bowel movement in the past 15 days since time of her colonoscopy.  Expresses that she was given Relastor yesterday by her pain clinic and took 3 of these and had severe nausea for which she had to take Reglan 10 mg.  This did help.  It did not result in a bowel movement.  Also expresses other laxatives have made her more nauseous and sick in the past including MiraLAX.  Denies recent episodes of vomiting.  Patient does express she was seen in the ER as above.  Also tells me she had an acute event of abdominal pain that radiated across the top of her abdomen on 03/20/2018 for which she saw her PCP.  Apparently they gave her a shot and she felt better.  Patient has remained constantly nauseous but does admit that when she was cleaned out for colonoscopy this was some better.  She has not picked up the Movantik from the pharmacy yet but this is waiting for her.    Denies fever, chills, weight loss, anorexia or symptoms that awaken her from sleep.  Previous Gi History: Colonoscopy January 08, 2006 normal exam EGD July 27, 2014 for dysphagia to solids and liquids. Nausea, epigastric pain.  Showed no evidence of esophageal stricture, empirically dilated with 52 French Maloney dilator.  Otherwise  unremarkable exam Upper endoscopy in 2009 showed esophagitis.  She has a history of ?gastroparesis on gastric emptying scan in 2000. GES was normal in 2005.   Past Medical History:  Diagnosis Date  . ADHD (attention deficit hyperactivity disorder)    "vs Bipolar" (09/26/2016)  . Anxiety   . Bipolar disorder ([place])    "vs ADHD" (09/26/2016)  . Candida esophagitis ([place])   . Chronic lower back pain   . Depression   . Dyspnea   . Esophagitis   . Gastroparesis   . GERD (gastroesophageal reflux disease)   . Headache    "q 4 months  now" (09/26/2016)  . Hypertension   . Hypothyroidism   . IBS (irritable bowel syndrome)   . Migraine    "maybe 1-2/year" (09/26/2016)  . OSA on CPAP   . Panic disorder   . Rheumatoid arthritis ([place])    "all over" (09/26/2016)  . Spinal stenosis   . Thyroid disease    hypothyroidism    Past Surgical History:  Procedure Laterality Date  . ABDOMINAL HERNIA REPAIR  02/01/2011   V[facility]  . BACK SURGERY    . BREAST BIOPSY Left    15+ years ago no visual scars, 2x  . CARDIAC CATHETERIZATION  2012  . DILATION AND CURETTAGE OF UTERUS    . HERNIA REPAIR    . KNEE ARTHROSCOPY Left 01/24/2006   hx/notes 10/17/2010  . LAPAROSCOPIC CHOLECYSTECTOMY  05/2001   hx/notes 10/17/2010  . LIPOMA EXCISION Right 12/2001   thigh, hx/notes 10/17/2010  . [place] SURGERY  1990s  . POSTERIOR LUMBAR FUSION  12/2008   "L4, L5, S1"  . TONSILLECTOMY    . TOTAL KNEE ARTHROPLASTY Left 09/26/2016  . TOTAL KNEE ARTHROPLASTY Left 09/26/2016   Procedure: LEFT TOTAL KNEE ARTHROPLASTY;  Surgeon: Ninetta Lights, MD;  Location: [facility];  Service: Orthopedics;  Laterality: Left;  . TUBAL LIGATION      Current Outpatient Medications  Medication Sig Dispense Refill  . ALPRAZolam (XANAX) 1 MG tablet Take 0.5 mg by mouth 3 (three) times daily.     Marland Kitchen atorvastatin (LIPITOR) 10 MG tablet Take 10 mg by mouth daily.    Marland Kitchen buPROPion (WELLBUTRIN XL) 300 MG 24 hr tablet Take 300 mg by mouth daily.  1  . Cholecalciferol (VITAMIN D3) 50000 units CAPS TK 1 C PO Q WEEK  1  . ENBREL SURECLICK 50 MG/ML injection Inject 50 mg into the skin once a week. Mondays    . lamoTRIgine (LAMICTAL) 200 MG tablet Take 200 mg by mouth daily.  1  . levothyroxine (SYNTHROID, LEVOTHROID) 112 MCG tablet TK 1 T PO QD IN THE MORNING OES  5  . linaclotide (LINZESS) 290 MCG CAPS capsule Take 1 capsule (290 mcg total) by mouth daily before breakfast. 30 capsule 2  . Melatonin 10 MG TABS Take 1 tablet by mouth at bedtime as needed.    . metoCLOPramide  (REGLAN) 10 MG tablet Take 1 tablet (10 mg total) by mouth every 8 (eight) hours as needed for nausea. 30 tablet 0  . naloxegol oxalate (MOVANTIK) 25 MG TABS tablet Take 1 tablet (25 mg total) by mouth daily. 30 tablet 2  . OVER THE COUNTER MEDICATION Take 5 mg by mouth daily. Fiberwell gummies    . Oxycodone HCl 10 MG TABS TAKE 1 TABLET 5 TIMES DAILY  0  . pantoprazole (PROTONIX) 40 MG tablet TAKE 1 TABLET BY MOUTH TWICE DAILY 60 tablet 2  . pramipexole (MIRAPEX)  0.5 MG tablet Take 1 tablet by mouth at bedtime.   1  . RELISTOR 150 MG TABS TK 3 TS PO QD  2  . tiZANidine (ZANAFLEX) 2 MG tablet Take 2-4 mg by mouth 3 (three) times daily as needed for muscle spasms (depends on pain if takes 1-2 tablets).     . triamterene-hydrochlorothiazide (MAXZIDE-25) 37.5-25 MG per tablet Take 1 each (1 tablet total) by mouth daily. 30 tablet 0  . UNABLE TO FIND Take 300 mg by mouth at bedtime as needed. Med Name: Hemp extract    . Vitamin D, Ergocalciferol, (DRISDOL) 50000 units CAPS capsule Take 50,000 Units by mouth every 7 (seven) days.     No current facility-administered medications for this visit.     Allergies as of 04/01/2018 - Review Complete 03/25/2018  Allergen Reaction Noted  . Escitalopram oxalate Other (See Comments) 01/31/2011  . Buspirone hcl Other (See Comments) 03/08/2009  . Fentanyl Other (See Comments) 03/11/2014  . Morphine and related Other (See Comments) 03/11/2014    Family History  Problem Relation Age of Onset  . Leukemia Mother 64  . Hypertension Father   . Heart disease Father   . Cirrhosis Father   . Crohn's disease Father   . Heart disease Brother        pacemaker  . Peripheral Artery Disease Brother   . Heart disease Maternal Grandmother   . Diabetes Brother   . Diabetes Unknown        grandfather  . Colon cancer Neg Hx     Social History   Socioeconomic History  . Marital status: Widowed    Spouse name: Not on file  . Number of children: 1  . Years of  education: Not on file  . Highest education level: Some college, no degree  Occupational History  . Occupation: diabled  Social Needs  . Financial resource strain: Not on file  . Food insecurity:    Worry: Not on file    Inability: Not on file  . Transportation needs:    Medical: Not on file    Non-medical: Not on file  Tobacco Use  . Smoking status: Former Smoker    Packs/day: 1.00    Years: 15.00    Pack years: 15.00    Types: Cigarettes    Last attempt to quit: 08/08/1998    Years since quitting: 19.6  . Smokeless tobacco: Never Used  Substance and Sexual Activity  . Alcohol use: No  . Drug use: Yes    Types: Cocaine, Marijuana    Comment: 09/26/2016 "nothing since at least the 1980s"  . Sexual activity: Not Currently  Lifestyle  . Physical activity:    Days per week: Not on file    Minutes per session: Not on file  . Stress: Not on file  Relationships  . Social connections:    Talks on phone: Not on file    Gets together: Not on file    Attends religious service: Not on file    Active member of club or organization: Not on file    Attends meetings of clubs or organizations: Not on file    Relationship status: Not on file  . Intimate partner violence:    Fear of current or ex partner: Not on file    Emotionally abused: Not on file    Physically abused: Not on file    Forced sexual activity: Not on file  Other Topics Concern  . Not on file  Social  History Narrative   Patient is right-handed. She lives alone in a one level home. She drinks 1/2 cup of coffee a day. She exercises occasionally on a stationary bike at home.    Review of Systems:    Constitutional: No weight loss, fever or chills Cardiovascular: No chest pain Respiratory: No SOB  Gastrointestinal: See HPI and otherwise negative   Physical Exam:  Vital signs: BP 102/70   Pulse 76   Ht 5\' 6"  (1.676 m)   Wt 193 lb 9.6 oz (87.8 kg)   LMP 08/08/1994   BMI 31.25 kg/m    Constitutional:   Pleasant  Caucasian female appears to be in NAD, Well developed, Well nourished, alert and cooperative Respiratory: Respirations even and unlabored. Lungs clear to auscultation bilaterally.   No wheezes, crackles, or rhonchi.  Cardiovascular: Normal S1, S2. No MRG. Regular rate and rhythm. No peripheral edema, cyanosis or pallor.  Gastrointestinal:  Soft, nondistended, nontender. No rebound or guarding. Decreased BS all four quadrants. No appreciable masses or hepatomegaly. Psychiatric: Demonstrates good judgement and reason without abnormal affect or behaviors.  RELEVANT LABS AND IMAGING: CBC    Component Value Date/Time   WBC 9.6 03/25/2018 1250   RBC 5.01 03/25/2018 1250   HGB 14.3 03/25/2018 1250   HCT 44.3 03/25/2018 1250   PLT 406 (H) 03/25/2018 1250   MCV 88.4 03/25/2018 1250   MCH 28.5 03/25/2018 1250   MCHC 32.3 03/25/2018 1250   RDW 12.7 03/25/2018 1250   LYMPHSABS 2.6 07/06/2014 1404   MONOABS 0.4 07/06/2014 1404   EOSABS 0.1 07/06/2014 1404   BASOSABS 0.1 07/06/2014 1404    CMP     Component Value Date/Time   NA 132 (L) 03/25/2018 1250   K 3.8 03/25/2018 1250   CL 101 03/25/2018 1250   CO2 24 03/25/2018 1250   GLUCOSE 108 (H) 03/25/2018 1250   BUN 12 03/25/2018 1250   CREATININE 1.05 (H) 03/25/2018 1250   CALCIUM 9.8 03/25/2018 1250   PROT 7.0 03/25/2018 1250   ALBUMIN 3.9 03/25/2018 1250   AST 14 (L) 03/25/2018 1250   ALT 15 03/25/2018 1250   ALKPHOS 60 03/25/2018 1250   BILITOT 0.6 03/25/2018 1250   GFRNONAA 55 (L) 03/25/2018 1250   GFRAA >60 03/25/2018 1250    Assessment: 1.  Chronic constipation: Recent colonoscopy 03/18/2018 was normal, constipation likely related to chronic pain medicine 2.  Nausea: With above  Plan: 1.  Reiterated to the patient that I believe her chronic constipation plus/minus narcotic bowel is causing her nausea. 2.  Patient to continue Linzess 290 mcg daily and start Movantik 25 mg daily as suggested by Dr. Silverio Decamp.  Patient was given  samples of Movantik today. 3.  Recommend she stop Relastor as this caused her severe nausea yesterday 4.  Discussed providing patient with another bowel prep but she tells me this made her "terribly ill" and she did not want another one. 5.  Patient to follow in clinic with Dr. Silverio Decamp in the next 3 to 4 weeks or sooner if necessary.  Ellouise Newer, PA-C [facility] 04/01/2018, 11:10 AM  Cc: Vernie Shanks, MD

## 2018-04-01 NOTE — Telephone Encounter (Signed)
Samples of Movantik at the front desk for the patient. She scheduled herself an appointment today. She will bring an up to date list of the medications she is presently taking.

## 2018-04-01 NOTE — Patient Instructions (Addendum)
If you are age 64 or older, your body mass index should be between 23-30. Your Body mass index is 31.25 kg/m. If this is out of the aforementioned range listed, please consider follow up with your Primary Care Provider.  If you are age 45 or younger, your body mass index should be between 19-25. Your Body mass index is 31.25 kg/m. If this is out of the aformentioned range listed, please consider follow up with your Primary Care Provider.   Start Movantik daily.    STOP Relastor.  Continue Linzess 290 mcg daily.  Follow up with Dr. Silverio Decamp on May 14, 2018 at 1:45 pm.  Thank you for choosing me and [facility].  Ellouise Newer, PA-C

## 2018-04-02 ENCOUNTER — Telehealth: Payer: Self-pay | Admitting: Gastroenterology

## 2018-04-02 ENCOUNTER — Ambulatory Visit: Payer: Medicare Other | Admitting: Gastroenterology

## 2018-04-02 MED ORDER — PLECANATIDE 3 MG PO TABS: 1.0000 | ORAL_TABLET | Freq: Every day | ORAL | 11 refills | Status: DC

## 2018-04-02 NOTE — Telephone Encounter (Signed)
If she is not having regular bowel movement with only Linzess 221mcg daily, will consider switching to Trulance 6mg  daily. Please check how patient is doing. Thanks

## 2018-04-02 NOTE — Telephone Encounter (Signed)
Patient states she is still not having a "good" bowel movement even taking the Linzess daily. Do you want me to send in Trulance?

## 2018-04-02 NOTE — Telephone Encounter (Signed)
Left a message for patient to return my call. 

## 2018-04-02 NOTE — Telephone Encounter (Signed)
Prescription sent to patient's pharmacy and patient notified to keep appt in December.

## 2018-04-02 NOTE — Telephone Encounter (Signed)
Received fax denial for prior authorization through [place] for [place]. Please advise Dr. Silverio Decamp if you want patient to start another medication.

## 2018-04-02 NOTE — Telephone Encounter (Signed)
Informed patient that I started a prior authorization today through her insurance company and we are waiting on a response. Told patient we will contact her after we hear back from her insurance company. Patient verbalized understanding.

## 2018-04-02 NOTE — Telephone Encounter (Signed)
Ok, please send Rx for Trulance 3mg  daily and follow up in office visit next available in 4-6 weeks. Thanks

## 2018-04-03 NOTE — Telephone Encounter (Signed)
Received fax that Trulance was approved with [employer].Patient notified and verbalized understanding.

## 2018-04-14 NOTE — Progress Notes (Signed)
Reviewed and agree with documentation and assessment and plan. K. Veena Nandigam , MD   

## 2018-05-14 ENCOUNTER — Ambulatory Visit: Payer: Medicare Other | Admitting: Gastroenterology

## 2018-07-04 ENCOUNTER — Other Ambulatory Visit: Payer: Self-pay | Admitting: Gastroenterology

## 2019-01-02 ENCOUNTER — Other Ambulatory Visit: Payer: Self-pay

## 2019-01-02 DIAGNOSIS — Z20822 Contact with and (suspected) exposure to covid-19: Secondary | ICD-10-CM

## 2019-01-04 LAB — NOVEL CORONAVIRUS, NAA: SARS-CoV-2, NAA: NOT DETECTED

## 2019-02-03 ENCOUNTER — Other Ambulatory Visit: Payer: Self-pay | Admitting: Family Medicine

## 2019-02-03 DIAGNOSIS — Z1231 Encounter for screening mammogram for malignant neoplasm of breast: Secondary | ICD-10-CM

## 2019-02-25 ENCOUNTER — Other Ambulatory Visit: Payer: Self-pay | Admitting: Physical Medicine and Rehabilitation

## 2019-02-25 DIAGNOSIS — M545 Low back pain, unspecified: Secondary | ICD-10-CM

## 2019-02-25 DIAGNOSIS — M79604 Pain in right leg: Secondary | ICD-10-CM

## 2019-02-25 DIAGNOSIS — M5416 Radiculopathy, lumbar region: Secondary | ICD-10-CM

## 2019-03-18 ENCOUNTER — Ambulatory Visit
Admission: RE | Admit: 2019-03-18 | Discharge: 2019-03-18 | Disposition: A | Discharge status: Home / Self Care | Payer: Medicare Other | Source: Ambulatory Visit | Attending: Physical Medicine and Rehabilitation | Admitting: Physical Medicine and Rehabilitation

## 2019-03-18 ENCOUNTER — Other Ambulatory Visit: Payer: Self-pay

## 2019-03-18 DIAGNOSIS — M5416 Radiculopathy, lumbar region: Secondary | ICD-10-CM

## 2019-03-18 DIAGNOSIS — M545 Low back pain, unspecified: Secondary | ICD-10-CM

## 2019-03-18 DIAGNOSIS — M79604 Pain in right leg: Secondary | ICD-10-CM

## 2019-03-18 MED ORDER — GADOBENATE DIMEGLUMINE 529 MG/ML IV SOLN
20.0000 mL | Freq: Once | INTRAVENOUS | Status: AC | PRN
  Administered 2019-03-18: 20 mL via INTRAVENOUS

## 2019-03-19 ENCOUNTER — Other Ambulatory Visit: Payer: Self-pay

## 2019-03-19 ENCOUNTER — Ambulatory Visit
Admission: RE | Admit: 2019-03-19 | Discharge: 2019-03-19 | Disposition: A | Discharge status: Home / Self Care | Payer: Medicare Other | Source: Ambulatory Visit | Attending: Family Medicine | Admitting: Family Medicine

## 2019-03-19 DIAGNOSIS — Z1231 Encounter for screening mammogram for malignant neoplasm of breast: Secondary | ICD-10-CM

## 2019-05-26 ENCOUNTER — Telehealth: Payer: Self-pay | Admitting: *Deleted

## 2019-05-26 NOTE — Telephone Encounter (Signed)
Trulance Prior auth sent to Cover my meds today

## 2020-07-06 DIAGNOSIS — M069 Rheumatoid arthritis, unspecified: Secondary | ICD-10-CM | POA: Diagnosis not present

## 2020-07-06 DIAGNOSIS — M62830 Muscle spasm of back: Secondary | ICD-10-CM | POA: Diagnosis not present

## 2020-07-06 DIAGNOSIS — G894 Chronic pain syndrome: Secondary | ICD-10-CM | POA: Diagnosis not present

## 2020-07-06 DIAGNOSIS — M47817 Spondylosis without myelopathy or radiculopathy, lumbosacral region: Secondary | ICD-10-CM | POA: Diagnosis not present

## 2020-07-22 DIAGNOSIS — M069 Rheumatoid arthritis, unspecified: Secondary | ICD-10-CM | POA: Diagnosis not present

## 2020-07-22 DIAGNOSIS — E039 Hypothyroidism, unspecified: Secondary | ICD-10-CM | POA: Diagnosis not present

## 2020-07-22 DIAGNOSIS — I10 Essential (primary) hypertension: Secondary | ICD-10-CM | POA: Diagnosis not present

## 2020-07-22 DIAGNOSIS — E1169 Type 2 diabetes mellitus with other specified complication: Secondary | ICD-10-CM | POA: Diagnosis not present

## 2020-07-22 DIAGNOSIS — E7800 Pure hypercholesterolemia, unspecified: Secondary | ICD-10-CM | POA: Diagnosis not present

## 2020-07-31 DIAGNOSIS — M4716 Other spondylosis with myelopathy, lumbar region: Secondary | ICD-10-CM | POA: Diagnosis not present

## 2020-07-31 DIAGNOSIS — G4733 Obstructive sleep apnea (adult) (pediatric): Secondary | ICD-10-CM | POA: Diagnosis not present

## 2020-08-03 DIAGNOSIS — G894 Chronic pain syndrome: Secondary | ICD-10-CM | POA: Diagnosis not present

## 2020-08-03 DIAGNOSIS — M069 Rheumatoid arthritis, unspecified: Secondary | ICD-10-CM | POA: Diagnosis not present

## 2020-08-03 DIAGNOSIS — M62830 Muscle spasm of back: Secondary | ICD-10-CM | POA: Diagnosis not present

## 2020-08-03 DIAGNOSIS — M47817 Spondylosis without myelopathy or radiculopathy, lumbosacral region: Secondary | ICD-10-CM | POA: Diagnosis not present

## 2020-08-03 DIAGNOSIS — M4727 Other spondylosis with radiculopathy, lumbosacral region: Secondary | ICD-10-CM | POA: Diagnosis not present

## 2020-08-28 DIAGNOSIS — M4716 Other spondylosis with myelopathy, lumbar region: Secondary | ICD-10-CM | POA: Diagnosis not present

## 2020-08-28 DIAGNOSIS — G4733 Obstructive sleep apnea (adult) (pediatric): Secondary | ICD-10-CM | POA: Diagnosis not present

## 2020-09-01 DIAGNOSIS — M069 Rheumatoid arthritis, unspecified: Secondary | ICD-10-CM | POA: Diagnosis not present

## 2020-09-01 DIAGNOSIS — E1169 Type 2 diabetes mellitus with other specified complication: Secondary | ICD-10-CM | POA: Diagnosis not present

## 2020-09-01 DIAGNOSIS — G8929 Other chronic pain: Secondary | ICD-10-CM | POA: Diagnosis not present

## 2020-09-01 DIAGNOSIS — G43109 Migraine with aura, not intractable, without status migrainosus: Secondary | ICD-10-CM | POA: Diagnosis not present

## 2020-09-01 DIAGNOSIS — E7800 Pure hypercholesterolemia, unspecified: Secondary | ICD-10-CM | POA: Diagnosis not present

## 2020-09-01 DIAGNOSIS — I10 Essential (primary) hypertension: Secondary | ICD-10-CM | POA: Diagnosis not present

## 2020-09-01 DIAGNOSIS — K219 Gastro-esophageal reflux disease without esophagitis: Secondary | ICD-10-CM | POA: Diagnosis not present

## 2020-09-01 DIAGNOSIS — E039 Hypothyroidism, unspecified: Secondary | ICD-10-CM | POA: Diagnosis not present

## 2020-09-05 DIAGNOSIS — M1711 Unilateral primary osteoarthritis, right knee: Secondary | ICD-10-CM | POA: Diagnosis not present

## 2020-09-07 DIAGNOSIS — M47817 Spondylosis without myelopathy or radiculopathy, lumbosacral region: Secondary | ICD-10-CM | POA: Diagnosis not present

## 2020-09-07 DIAGNOSIS — Z79899 Other long term (current) drug therapy: Secondary | ICD-10-CM | POA: Diagnosis not present

## 2020-09-07 DIAGNOSIS — M069 Rheumatoid arthritis, unspecified: Secondary | ICD-10-CM | POA: Diagnosis not present

## 2020-09-07 DIAGNOSIS — G894 Chronic pain syndrome: Secondary | ICD-10-CM | POA: Diagnosis not present

## 2020-09-07 DIAGNOSIS — M62830 Muscle spasm of back: Secondary | ICD-10-CM | POA: Diagnosis not present

## 2020-09-22 DIAGNOSIS — E7800 Pure hypercholesterolemia, unspecified: Secondary | ICD-10-CM | POA: Diagnosis not present

## 2020-09-22 DIAGNOSIS — E039 Hypothyroidism, unspecified: Secondary | ICD-10-CM | POA: Diagnosis not present

## 2020-09-22 DIAGNOSIS — G8929 Other chronic pain: Secondary | ICD-10-CM | POA: Diagnosis not present

## 2020-09-22 DIAGNOSIS — I10 Essential (primary) hypertension: Secondary | ICD-10-CM | POA: Diagnosis not present

## 2020-09-22 DIAGNOSIS — K219 Gastro-esophageal reflux disease without esophagitis: Secondary | ICD-10-CM | POA: Diagnosis not present

## 2020-09-22 DIAGNOSIS — E119 Type 2 diabetes mellitus without complications: Secondary | ICD-10-CM | POA: Diagnosis not present

## 2020-09-22 DIAGNOSIS — M069 Rheumatoid arthritis, unspecified: Secondary | ICD-10-CM | POA: Diagnosis not present

## 2020-09-22 DIAGNOSIS — M1711 Unilateral primary osteoarthritis, right knee: Secondary | ICD-10-CM | POA: Diagnosis not present

## 2020-09-28 DIAGNOSIS — M4716 Other spondylosis with myelopathy, lumbar region: Secondary | ICD-10-CM | POA: Diagnosis not present

## 2020-09-28 DIAGNOSIS — G4733 Obstructive sleep apnea (adult) (pediatric): Secondary | ICD-10-CM | POA: Diagnosis not present

## 2020-10-03 DIAGNOSIS — Z0000 Encounter for general adult medical examination without abnormal findings: Secondary | ICD-10-CM | POA: Diagnosis not present

## 2020-10-05 DIAGNOSIS — M1711 Unilateral primary osteoarthritis, right knee: Secondary | ICD-10-CM | POA: Diagnosis not present

## 2020-10-06 DIAGNOSIS — M47817 Spondylosis without myelopathy or radiculopathy, lumbosacral region: Secondary | ICD-10-CM | POA: Diagnosis not present

## 2020-10-06 DIAGNOSIS — G894 Chronic pain syndrome: Secondary | ICD-10-CM | POA: Diagnosis not present

## 2020-10-06 DIAGNOSIS — M069 Rheumatoid arthritis, unspecified: Secondary | ICD-10-CM | POA: Diagnosis not present

## 2020-10-06 DIAGNOSIS — M62830 Muscle spasm of back: Secondary | ICD-10-CM | POA: Diagnosis not present

## 2020-10-17 DIAGNOSIS — M0579 Rheumatoid arthritis with rheumatoid factor of multiple sites without organ or systems involvement: Secondary | ICD-10-CM | POA: Diagnosis not present

## 2020-10-17 DIAGNOSIS — Z79899 Other long term (current) drug therapy: Secondary | ICD-10-CM | POA: Diagnosis not present

## 2020-10-17 DIAGNOSIS — M7061 Trochanteric bursitis, right hip: Secondary | ICD-10-CM | POA: Diagnosis not present

## 2020-10-17 DIAGNOSIS — M7712 Lateral epicondylitis, left elbow: Secondary | ICD-10-CM | POA: Diagnosis not present

## 2020-10-17 DIAGNOSIS — M159 Polyosteoarthritis, unspecified: Secondary | ICD-10-CM | POA: Diagnosis not present

## 2020-10-17 DIAGNOSIS — M2550 Pain in unspecified joint: Secondary | ICD-10-CM | POA: Diagnosis not present

## 2020-10-24 DIAGNOSIS — E7800 Pure hypercholesterolemia, unspecified: Secondary | ICD-10-CM | POA: Diagnosis not present

## 2020-10-24 DIAGNOSIS — K219 Gastro-esophageal reflux disease without esophagitis: Secondary | ICD-10-CM | POA: Diagnosis not present

## 2020-10-24 DIAGNOSIS — E119 Type 2 diabetes mellitus without complications: Secondary | ICD-10-CM | POA: Diagnosis not present

## 2020-10-24 DIAGNOSIS — E1169 Type 2 diabetes mellitus with other specified complication: Secondary | ICD-10-CM | POA: Diagnosis not present

## 2020-10-24 DIAGNOSIS — I10 Essential (primary) hypertension: Secondary | ICD-10-CM | POA: Diagnosis not present

## 2020-10-24 DIAGNOSIS — G43109 Migraine with aura, not intractable, without status migrainosus: Secondary | ICD-10-CM | POA: Diagnosis not present

## 2020-10-24 DIAGNOSIS — E039 Hypothyroidism, unspecified: Secondary | ICD-10-CM | POA: Diagnosis not present

## 2020-10-24 DIAGNOSIS — G8929 Other chronic pain: Secondary | ICD-10-CM | POA: Diagnosis not present

## 2020-10-28 DIAGNOSIS — M4716 Other spondylosis with myelopathy, lumbar region: Secondary | ICD-10-CM | POA: Diagnosis not present

## 2020-10-28 DIAGNOSIS — G4733 Obstructive sleep apnea (adult) (pediatric): Secondary | ICD-10-CM | POA: Diagnosis not present

## 2020-11-16 DIAGNOSIS — M1711 Unilateral primary osteoarthritis, right knee: Secondary | ICD-10-CM | POA: Diagnosis not present

## 2020-11-24 DIAGNOSIS — R944 Abnormal results of kidney function studies: Secondary | ICD-10-CM | POA: Diagnosis not present

## 2020-11-25 DIAGNOSIS — Z981 Arthrodesis status: Secondary | ICD-10-CM | POA: Diagnosis not present

## 2020-11-25 DIAGNOSIS — M461 Sacroiliitis, not elsewhere classified: Secondary | ICD-10-CM | POA: Insufficient documentation

## 2020-11-25 DIAGNOSIS — M5136 Other intervertebral disc degeneration, lumbar region: Secondary | ICD-10-CM | POA: Diagnosis not present

## 2020-11-25 DIAGNOSIS — M4726 Other spondylosis with radiculopathy, lumbar region: Secondary | ICD-10-CM | POA: Insufficient documentation

## 2020-11-25 DIAGNOSIS — M47818 Spondylosis without myelopathy or radiculopathy, sacral and sacrococcygeal region: Secondary | ICD-10-CM | POA: Insufficient documentation

## 2020-11-25 DIAGNOSIS — M4316 Spondylolisthesis, lumbar region: Secondary | ICD-10-CM | POA: Diagnosis not present

## 2020-11-25 DIAGNOSIS — Z9889 Other specified postprocedural states: Secondary | ICD-10-CM | POA: Diagnosis not present

## 2020-11-25 DIAGNOSIS — I999 Unspecified disorder of circulatory system: Secondary | ICD-10-CM | POA: Diagnosis not present

## 2020-11-25 DIAGNOSIS — Z4789 Encounter for other orthopedic aftercare: Secondary | ICD-10-CM | POA: Diagnosis not present

## 2020-11-25 DIAGNOSIS — M4310 Spondylolisthesis, site unspecified: Secondary | ICD-10-CM | POA: Diagnosis not present

## 2020-12-01 DIAGNOSIS — M069 Rheumatoid arthritis, unspecified: Secondary | ICD-10-CM | POA: Diagnosis not present

## 2020-12-01 DIAGNOSIS — E039 Hypothyroidism, unspecified: Secondary | ICD-10-CM | POA: Diagnosis not present

## 2020-12-01 DIAGNOSIS — I10 Essential (primary) hypertension: Secondary | ICD-10-CM | POA: Diagnosis not present

## 2020-12-01 DIAGNOSIS — E119 Type 2 diabetes mellitus without complications: Secondary | ICD-10-CM | POA: Diagnosis not present

## 2020-12-01 DIAGNOSIS — G43109 Migraine with aura, not intractable, without status migrainosus: Secondary | ICD-10-CM | POA: Diagnosis not present

## 2020-12-01 DIAGNOSIS — G8929 Other chronic pain: Secondary | ICD-10-CM | POA: Diagnosis not present

## 2020-12-01 DIAGNOSIS — E1169 Type 2 diabetes mellitus with other specified complication: Secondary | ICD-10-CM | POA: Diagnosis not present

## 2020-12-01 DIAGNOSIS — E7800 Pure hypercholesterolemia, unspecified: Secondary | ICD-10-CM | POA: Diagnosis not present

## 2020-12-01 DIAGNOSIS — K219 Gastro-esophageal reflux disease without esophagitis: Secondary | ICD-10-CM | POA: Diagnosis not present

## 2020-12-02 DIAGNOSIS — M069 Rheumatoid arthritis, unspecified: Secondary | ICD-10-CM | POA: Diagnosis not present

## 2020-12-02 DIAGNOSIS — E119 Type 2 diabetes mellitus without complications: Secondary | ICD-10-CM | POA: Diagnosis not present

## 2020-12-02 DIAGNOSIS — G43109 Migraine with aura, not intractable, without status migrainosus: Secondary | ICD-10-CM | POA: Diagnosis not present

## 2020-12-02 DIAGNOSIS — E7800 Pure hypercholesterolemia, unspecified: Secondary | ICD-10-CM | POA: Diagnosis not present

## 2020-12-02 DIAGNOSIS — M1711 Unilateral primary osteoarthritis, right knee: Secondary | ICD-10-CM | POA: Diagnosis not present

## 2020-12-02 DIAGNOSIS — G8929 Other chronic pain: Secondary | ICD-10-CM | POA: Diagnosis not present

## 2020-12-02 DIAGNOSIS — E039 Hypothyroidism, unspecified: Secondary | ICD-10-CM | POA: Diagnosis not present

## 2020-12-02 DIAGNOSIS — K219 Gastro-esophageal reflux disease without esophagitis: Secondary | ICD-10-CM | POA: Diagnosis not present

## 2020-12-02 DIAGNOSIS — I10 Essential (primary) hypertension: Secondary | ICD-10-CM | POA: Diagnosis not present

## 2020-12-06 DIAGNOSIS — M47817 Spondylosis without myelopathy or radiculopathy, lumbosacral region: Secondary | ICD-10-CM | POA: Diagnosis not present

## 2020-12-06 DIAGNOSIS — M62830 Muscle spasm of back: Secondary | ICD-10-CM | POA: Diagnosis not present

## 2020-12-06 DIAGNOSIS — Z79891 Long term (current) use of opiate analgesic: Secondary | ICD-10-CM | POA: Diagnosis not present

## 2020-12-06 DIAGNOSIS — G894 Chronic pain syndrome: Secondary | ICD-10-CM | POA: Diagnosis not present

## 2020-12-06 DIAGNOSIS — M069 Rheumatoid arthritis, unspecified: Secondary | ICD-10-CM | POA: Diagnosis not present

## 2020-12-09 DIAGNOSIS — E1169 Type 2 diabetes mellitus with other specified complication: Secondary | ICD-10-CM | POA: Diagnosis not present

## 2020-12-09 DIAGNOSIS — I10 Essential (primary) hypertension: Secondary | ICD-10-CM | POA: Diagnosis not present

## 2020-12-09 DIAGNOSIS — E039 Hypothyroidism, unspecified: Secondary | ICD-10-CM | POA: Diagnosis not present

## 2020-12-09 DIAGNOSIS — M069 Rheumatoid arthritis, unspecified: Secondary | ICD-10-CM | POA: Diagnosis not present

## 2020-12-09 DIAGNOSIS — M1711 Unilateral primary osteoarthritis, right knee: Secondary | ICD-10-CM | POA: Diagnosis not present

## 2020-12-09 DIAGNOSIS — K219 Gastro-esophageal reflux disease without esophagitis: Secondary | ICD-10-CM | POA: Diagnosis not present

## 2020-12-09 DIAGNOSIS — G8929 Other chronic pain: Secondary | ICD-10-CM | POA: Diagnosis not present

## 2020-12-09 DIAGNOSIS — E7800 Pure hypercholesterolemia, unspecified: Secondary | ICD-10-CM | POA: Diagnosis not present

## 2020-12-12 DIAGNOSIS — M4726 Other spondylosis with radiculopathy, lumbar region: Secondary | ICD-10-CM | POA: Diagnosis not present

## 2020-12-12 DIAGNOSIS — M48061 Spinal stenosis, lumbar region without neurogenic claudication: Secondary | ICD-10-CM | POA: Diagnosis not present

## 2020-12-12 DIAGNOSIS — Z981 Arthrodesis status: Secondary | ICD-10-CM | POA: Diagnosis not present

## 2020-12-12 DIAGNOSIS — M5116 Intervertebral disc disorders with radiculopathy, lumbar region: Secondary | ICD-10-CM | POA: Diagnosis not present

## 2020-12-12 DIAGNOSIS — Z4789 Encounter for other orthopedic aftercare: Secondary | ICD-10-CM | POA: Diagnosis not present

## 2020-12-15 ENCOUNTER — Other Ambulatory Visit: Payer: Self-pay

## 2020-12-15 ENCOUNTER — Ambulatory Visit: Payer: Medicare Other | Admitting: Cardiology

## 2020-12-15 ENCOUNTER — Encounter: Payer: Self-pay | Admitting: Cardiology

## 2020-12-15 VITALS — BP 140/85 | HR 83 | Temp 98.6°F | Resp 16 | Ht 66.0 in | Wt 229.0 lb

## 2020-12-15 DIAGNOSIS — E782 Mixed hyperlipidemia: Secondary | ICD-10-CM | POA: Diagnosis not present

## 2020-12-15 DIAGNOSIS — Z87891 Personal history of nicotine dependence: Secondary | ICD-10-CM | POA: Diagnosis not present

## 2020-12-15 DIAGNOSIS — R7303 Prediabetes: Secondary | ICD-10-CM

## 2020-12-15 DIAGNOSIS — I10 Essential (primary) hypertension: Secondary | ICD-10-CM | POA: Diagnosis not present

## 2020-12-15 DIAGNOSIS — Z01818 Encounter for other preprocedural examination: Secondary | ICD-10-CM

## 2020-12-15 NOTE — Progress Notes (Signed)
Date:  12/15/2020   ID:  Christine Robles, DOB Jan 29, 1956, MRN [id number]  PCP:  Vernie Shanks, MD  Cardiologist:  Rex Kras, DO, [facility] (established care 12/15/2020) Former Cardiology Providers: Dr. Wynonia Lawman  REASON FOR CONSULT: Preop clearance  REQUESTING PHYSICIAN:  Vernie Shanks, MD [place],  [place] [id number]  Chief Complaint  Patient presents with   Pre-op Exam   New Patient (Initial Visit)    HPI  Christine Robles is a 65 y.o. female who presents to the office with a chief complaint of " preop clearance prior to knee replacement surgery." Patient's past medical history and cardiovascular risk factors include: Rheumatoid arthritis, hypothyroidism, obesity, hypertension, hypercholesterolemia, gastroparesis, prediabetes, OSA, bipolar, postmenopausal female, advanced age.  She is referred to the office at the request of Vernie Shanks, MD for evaluation of preop risk stratification.  Patient states that she is being scheduled for a right total knee replacement, date of surgery is to be determined, she will be having it performed by one of the providers at [facility].  She was asked to have a cardiology clearance prior to having the surgery scheduled.  Her last surgical intervention was the left knee replacement approximately 2 years ago.  There was no perioperative complications.  No prior history of myocardial infarction/coronary disease/TIA/CVA.  Patient is currently a prediabetic and on Ozempic.  Her most recent vital signs notes a BMI of 36.  She denies any active chest pain or shortness of breath at rest or with effort related activities.  Patient is overall euvolemic and not in congestive heart failure.  Overall functional status is limited due to knee pain.  FUNCTIONAL STATUS: No structured exercise program or daily routine.   ALLERGIES: Allergies  Allergen Reactions   Escitalopram Oxalate Other (See Comments)    Stroke like  symptoms. Pt thinks she may have been given too big a dose.   Buspirone Hcl Other (See Comments)    Abnormal behavior   Fentanyl Other (See Comments)    Headache    Morphine And Related Other (See Comments)    headache    MEDICATION LIST PRIOR TO VISIT: Current Meds  Medication Sig   ALPRAZolam (XANAX) 1 MG tablet Take 0.5 mg by mouth 2 (two) times daily as needed for anxiety.    amLODipine (NORVASC) 5 MG tablet Take 1 tablet by mouth daily at 12 noon.   atorvastatin (LIPITOR) 10 MG tablet Take 10 mg by mouth daily.   Cholecalciferol (VITAMIN D3) 50000 units CAPS TK 1 C PO Q WEEK   diclofenac Sodium (VOLTAREN) 1 % GEL Apply 1 application topically as needed.   ENBREL SURECLICK 50 MG/ML injection Inject 50 mg into the skin once a week. Mondays   gabapentin (NEURONTIN) 400 MG capsule Take 400 mg by mouth 3 (three) times daily.   lamoTRIgine (LAMICTAL) 200 MG tablet Take 200 mg by mouth daily.   Levomefolate Glucosamine (METHYLFOLATE PO) Take 15 mg by mouth daily at 12 noon.   levothyroxine (SYNTHROID, LEVOTHROID) 112 MCG tablet TK 1 T PO QD IN THE MORNING OES   Magnesium Oxide 250 MG TABS Take 1 tablet by mouth daily at 12 noon.   Melatonin 10 MG TABS Take 1 tablet by mouth at bedtime as needed.   meloxicam (MOBIC) 15 MG tablet Take 15 mg by mouth every morning.   metoCLOPramide (REGLAN) 10 MG tablet Take 1 tablet (10 mg total) by mouth every 8 (eight) hours as  needed for nausea.   Oxycodone HCl 10 MG TABS Take 10 mg by mouth 4 (four) times daily.    pantoprazole (PROTONIX) 40 MG tablet TAKE 1 TABLET BY MOUTH TWICE DAILY (Patient taking differently: Take 40 mg by mouth daily.)   Polyethyl Glycol-Propyl Glycol 0.4-0.3 % SOLN Apply to eye as needed.   RELISTOR 150 MG TABS TK 3 TS PO QD   Semaglutide,0.25 or 0.5MG/DOS, (OZEMPIC, 0.25 OR 0.5 MG/DOSE,) 2 MG/1.5ML SOPN Inject 0.25 mLs into the skin once a week.   tiZANidine (ZANAFLEX) 2 MG tablet Take 2-4 mg by mouth 3 (three) times daily as  needed for muscle spasms (depends on pain if takes 1-2 tablets).    triamterene-hydrochlorothiazide (MAXZIDE-25) 37.5-25 MG per tablet Take 1 each (1 tablet total) by mouth daily.   Vitamin D, Ergocalciferol, (DRISDOL) 50000 units CAPS capsule Take 50,000 Units by mouth every 7 (seven) days.     PAST MEDICAL HISTORY: Past Medical History:  Diagnosis Date   ADHD (attention deficit hyperactivity disorder)    "vs Bipolar" (09/26/2016)   Anxiety    Bipolar disorder ([place])    "vs ADHD" (09/26/2016)   Candida esophagitis (HCC)    Chronic lower back pain    Depression    Dyspnea    Esophagitis    Gastroparesis    GERD (gastroesophageal reflux disease)    Headache    "q 4 months now" (09/26/2016)   Hypertension    Hypothyroidism    IBS (irritable bowel syndrome)    Migraine    "maybe 1-2/year" (09/26/2016)   OSA on CPAP    Panic disorder    Rheumatoid arthritis ([place])    "all over" (09/26/2016)   Spinal stenosis    Thyroid disease    hypothyroidism    PAST SURGICAL HISTORY: Past Surgical History:  Procedure Laterality Date   ABDOMINAL HERNIA REPAIR  02/01/2011   [facility]   BREAST BIOPSY Left    15+ years ago no visual scars, 2x   CARDIAC CATHETERIZATION  2012   DILATION AND CURETTAGE OF UTERUS     KNEE ARTHROSCOPY Left 01/24/2006   hx/notes 10/17/2010   LAPAROSCOPIC CHOLECYSTECTOMY  05/2001   hx/notes 10/17/2010   LIPOMA EXCISION Right 12/2001   thigh, hx/notes 10/17/2010   LUMBAR DISC SURGERY  1990s   POSTERIOR LUMBAR FUSION  12/2008   "L4, L5, S1"   TONSILLECTOMY     TOTAL KNEE ARTHROPLASTY Left 09/26/2016   Procedure: LEFT TOTAL KNEE ARTHROPLASTY;  Surgeon: Ninetta Lights, MD;  Location: [facility];  Service: Orthopedics;  Laterality: Left;   TUBAL LIGATION      FAMILY HISTORY: The patient family history includes Cirrhosis in her father; Crohn's disease in her father; Diabetes in her brother and maternal grandfather; Heart disease in her brother, father, and maternal grandmother;  Hypertension in her brother and father; Leukemia (age of onset: 72) in her mother; Peripheral Artery Disease in her brother.  SOCIAL HISTORY:  The patient  reports that she quit smoking about 22 years ago. Her smoking use included cigarettes. She has a 15.00 pack-year smoking history. She has never used smokeless tobacco. She reports previous drug use. Drugs: Cocaine and Marijuana. She reports that she does not drink alcohol.  REVIEW OF SYSTEMS: Review of Systems  Constitutional: Negative for chills and fever.  HENT:  Negative for hoarse voice and nosebleeds.   Eyes:  Negative for discharge, double vision and pain.  Cardiovascular:  Negative for chest pain, claudication, dyspnea on exertion, leg swelling,  near-syncope, orthopnea, palpitations, paroxysmal nocturnal dyspnea and syncope.  Respiratory:  Negative for hemoptysis and shortness of breath.   Musculoskeletal:  Negative for muscle cramps and myalgias.  Gastrointestinal:  Negative for abdominal pain, constipation, diarrhea, hematemesis, hematochezia, melena, nausea and vomiting.  Neurological:  Negative for dizziness and light-headedness.   PHYSICAL EXAM: Vitals with BMI 12/15/2020 04/01/2018 03/25/2018  Height 5' 6" 5' 6" -  Weight 229 lbs 193 lbs 10 oz -  BMI 58.30 94.07 -  Systolic 680 881 103  Diastolic 85 70 64  Pulse 83 76 74  Some encounter information is confidential and restricted. Go to Review Flowsheets activity to see all data.    CONSTITUTIONAL: Well-developed and well-nourished. No acute distress.  SKIN: Skin is warm and dry. No rash noted. No cyanosis. No pallor. No jaundice HEAD: Normocephalic and atraumatic.  EYES: No scleral icterus MOUTH/THROAT: Moist oral membranes.  NECK: No JVD present. No thyromegaly noted. No carotid bruits  LYMPHATIC: No visible cervical adenopathy.  CHEST Normal respiratory effort. No intercostal retractions  LUNGS: Clear to auscultation bilaterally. No stridor. No wheezes. No rales.   CARDIOVASCULAR: Regular rate and rhythm, positive S1-S2, no murmurs rubs or gallops appreciated. ABDOMINAL: Obese, soft, nontender, nondistended, positive bowel sounds all 4 quadrants. No apparent ascites.  EXTREMITIES: No peripheral edema.  2+ bilateral dorsalis pedis and posterior tibial pulses. HEMATOLOGIC: No significant bruising NEUROLOGIC: Oriented to person, place, and time. Nonfocal. Normal muscle tone.  PSYCHIATRIC: Normal mood and affect. Normal behavior. Cooperative  CARDIAC DATABASE: EKG: 12/15/2020: Normal sinus rhythm, 75 bpm, normal axis, without underlying ischemia or injury pattern.  Echocardiogram: No results found for this or any previous visit from the past 1095 days.   Stress Testing: No results found for this or any previous visit from the past 1095 days.  Heart Catheterization: In 2005, no records available to review.   LABORATORY DATA: External Labs: Collected: 07/22/2020 Creatinine 1.17 mg/dL. eGFR: 46 mL/min per 1.73 m Sodium 135, potassium 4, chloride 99, bicarb 30,  Collected: 06/17/2020 Hemoglobin A1c: 5.4 TSH: 3.05 Total cholesterol 185, triglycerides 99, HDL 76, LDL 91  IMPRESSION:    ICD-10-CM   1. Pre-op evaluation  Z01.818 EKG 12-Lead    PCV ECHOCARDIOGRAM COMPLETE    PCV MYOCARDIAL PERFUSION WITH LEXISCAN    2. Essential hypertension  I10     3. Prediabetes  R73.03     4. Mixed hyperlipidemia  E78.2     5. Former smoker  Z87.891        RECOMMENDATIONS: Christine Robles is a 65 y.o. female whose past medical history and cardiac risk factors include: Rheumatoid arthritis, hypothyroidism, obesity, hypertension, hypercholesterolemia, gastroparesis, prediabetes, OSA, bipolar, postmenopausal female, advanced age.  Preoperative risk stratification: Patient is scheduled for an elective noncardiac surgery.  Date to be determined. Tentatively planning to have right total knee replacement. No active chest pain to suggest angina  pectoris and clinically not in congestive heart failure. No prior history of coronary artery disease/prior PCI/TIA/CVA. Overall functional status unknown/unlimited. Shared decision was to proceed with an echocardiogram to evaluate for LVEF and structural heart disease and nuclear stress test to evaluate for reversible ischemia.  Benign essential hypertension: Blood pressure at today's office visit is within acceptable range. Patient states that her blood pressures at home are better controlled. Medications reconciled. Encouraged low-salt diet.  Hyperlipidemia: Currently on statin therapy.  PCP  FINAL MEDICATION LIST END OF ENCOUNTER: No orders of the defined types were placed in this encounter.   Medications  Discontinued During This Encounter  Medication Reason   benzonatate (TESSALON) 100 MG capsule Error   buPROPion (WELLBUTRIN XL) 300 MG 24 hr tablet Error   DULoxetine (CYMBALTA) 60 MG capsule Error   LINZESS 290 MCG CAPS capsule Error   nystatin (MYCOSTATIN) 100000 UNIT/ML suspension Error   OVER THE COUNTER MEDICATION Error   Plecanatide (TRULANCE) 3 MG TABS Error   pramipexole (MIRAPEX) 0.5 MG tablet Error   UNABLE TO FIND Error     Current Outpatient Medications:    ALPRAZolam (XANAX) 1 MG tablet, Take 0.5 mg by mouth 2 (two) times daily as needed for anxiety. , Disp: , Rfl:    amLODipine (NORVASC) 5 MG tablet, Take 1 tablet by mouth daily at 12 noon., Disp: , Rfl:    atorvastatin (LIPITOR) 10 MG tablet, Take 10 mg by mouth daily., Disp: , Rfl:    Cholecalciferol (VITAMIN D3) 50000 units CAPS, TK 1 C PO Q WEEK, Disp: , Rfl: 1   diclofenac Sodium (VOLTAREN) 1 % GEL, Apply 1 application topically as needed., Disp: , Rfl:    ENBREL SURECLICK 50 MG/ML injection, Inject 50 mg into the skin once a week. Mondays, Disp: , Rfl:    gabapentin (NEURONTIN) 400 MG capsule, Take 400 mg by mouth 3 (three) times daily., Disp: , Rfl:    lamoTRIgine (LAMICTAL) 200 MG tablet, Take 200 mg by  mouth daily., Disp: , Rfl: 1   Levomefolate Glucosamine (METHYLFOLATE PO), Take 15 mg by mouth daily at 12 noon., Disp: , Rfl:    levothyroxine (SYNTHROID, LEVOTHROID) 112 MCG tablet, TK 1 T PO QD IN THE MORNING OES, Disp: , Rfl: 5   Magnesium Oxide 250 MG TABS, Take 1 tablet by mouth daily at 12 noon., Disp: , Rfl:    Melatonin 10 MG TABS, Take 1 tablet by mouth at bedtime as needed., Disp: , Rfl:    meloxicam (MOBIC) 15 MG tablet, Take 15 mg by mouth every morning., Disp: , Rfl:    metoCLOPramide (REGLAN) 10 MG tablet, Take 1 tablet (10 mg total) by mouth every 8 (eight) hours as needed for nausea., Disp: 30 tablet, Rfl: 0   Oxycodone HCl 10 MG TABS, Take 10 mg by mouth 4 (four) times daily. , Disp: , Rfl: 0   pantoprazole (PROTONIX) 40 MG tablet, TAKE 1 TABLET BY MOUTH TWICE DAILY (Patient taking differently: Take 40 mg by mouth daily.), Disp: 60 tablet, Rfl: 2   Polyethyl Glycol-Propyl Glycol 0.4-0.3 % SOLN, Apply to eye as needed., Disp: , Rfl:    RELISTOR 150 MG TABS, TK 3 TS PO QD, Disp: , Rfl: 2   Semaglutide,0.25 or 0.5MG/DOS, (OZEMPIC, 0.25 OR 0.5 MG/DOSE,) 2 MG/1.5ML SOPN, Inject 0.25 mLs into the skin once a week., Disp: , Rfl:    tiZANidine (ZANAFLEX) 2 MG tablet, Take 2-4 mg by mouth 3 (three) times daily as needed for muscle spasms (depends on pain if takes 1-2 tablets). , Disp: , Rfl:    triamterene-hydrochlorothiazide (MAXZIDE-25) 37.5-25 MG per tablet, Take 1 each (1 tablet total) by mouth daily., Disp: 30 tablet, Rfl: 0   Vitamin D, Ergocalciferol, (DRISDOL) 50000 units CAPS capsule, Take 50,000 Units by mouth every 7 (seven) days., Disp: , Rfl:   Orders Placed This Encounter  Procedures   PCV MYOCARDIAL PERFUSION WITH LEXISCAN   EKG 12-Lead   PCV ECHOCARDIOGRAM COMPLETE    There are no Patient Instructions on file for this visit.   --Continue cardiac medications as reconciled in final medication  list. --Return in about 6 months (around 06/17/2021) for Follow up. Or sooner  if needed. --Continue follow-up with your primary care physician regarding the management of your other chronic comorbid conditions.  Patient's questions and concerns were addressed to her satisfaction. She voices understanding of the instructions provided during this encounter.   This note was created using a voice recognition software as a result there may be grammatical errors inadvertently enclosed that do not reflect the nature of this encounter. Every attempt is made to correct such errors.  Rex Kras, [place], [facility]  Pager: [phone] Office: [phone]

## 2020-12-16 DIAGNOSIS — Z981 Arthrodesis status: Secondary | ICD-10-CM | POA: Diagnosis not present

## 2020-12-16 DIAGNOSIS — M47818 Spondylosis without myelopathy or radiculopathy, sacral and sacrococcygeal region: Secondary | ICD-10-CM | POA: Diagnosis not present

## 2020-12-20 ENCOUNTER — Other Ambulatory Visit: Payer: Self-pay

## 2020-12-20 ENCOUNTER — Ambulatory Visit: Payer: Medicare Other

## 2020-12-20 DIAGNOSIS — Z01810 Encounter for preprocedural cardiovascular examination: Secondary | ICD-10-CM | POA: Diagnosis not present

## 2020-12-20 DIAGNOSIS — Z01818 Encounter for other preprocedural examination: Secondary | ICD-10-CM

## 2021-01-02 ENCOUNTER — Other Ambulatory Visit: Payer: Self-pay

## 2021-01-02 ENCOUNTER — Ambulatory Visit: Payer: Medicare Other

## 2021-01-02 DIAGNOSIS — Z01818 Encounter for other preprocedural examination: Secondary | ICD-10-CM | POA: Diagnosis not present

## 2021-01-02 DIAGNOSIS — Z01810 Encounter for preprocedural cardiovascular examination: Secondary | ICD-10-CM | POA: Diagnosis not present

## 2021-01-03 DIAGNOSIS — M47817 Spondylosis without myelopathy or radiculopathy, lumbosacral region: Secondary | ICD-10-CM | POA: Diagnosis not present

## 2021-01-03 DIAGNOSIS — M62830 Muscle spasm of back: Secondary | ICD-10-CM | POA: Diagnosis not present

## 2021-01-03 DIAGNOSIS — M069 Rheumatoid arthritis, unspecified: Secondary | ICD-10-CM | POA: Diagnosis not present

## 2021-01-03 DIAGNOSIS — G894 Chronic pain syndrome: Secondary | ICD-10-CM | POA: Diagnosis not present

## 2021-01-04 NOTE — Progress Notes (Signed)
Sent message, via epic in basket, requesting orders in epic from surgeon.  

## 2021-01-05 DIAGNOSIS — M1711 Unilateral primary osteoarthritis, right knee: Secondary | ICD-10-CM | POA: Diagnosis present

## 2021-01-09 DIAGNOSIS — M1711 Unilateral primary osteoarthritis, right knee: Secondary | ICD-10-CM | POA: Diagnosis not present

## 2021-01-10 NOTE — Progress Notes (Signed)
DUE TO COVID-19 ONLY ONE VISITOR IS ALLOWED TO COME WITH YOU AND STAY IN THE WAITING ROOM ONLY DURING PRE OP AND PROCEDURE DAY OF SURGERY.  2 VISITOR  MAY VISIT WITH YOU AFTER SURGERY IN YOUR PRIVATE ROOM DURING VISITING HOURS ONLY!  YOU NEED TO HAVE A COVID 19 TEST ON___8/19/2022 ___'@_'$  '@_from'$  8am-3pm _____, THIS TEST MUST BE DONE BEFORE SURGERY,  Covid test is done at [place], [place] [address].  This is a drive thru.  No appt required. Please see map.                 Your procedure is scheduled on:  01/24/2021   Report to [facility] Main  Entrance   Report to admitting at    [phone]     Call this number if you have problems the morning of surgery [phone]    REMEMBER: NO  SOLID FOOD CANDY OR GUM AFTER MIDNIGHT. CLEAR LIQUIDS UNTIL    0430am        . NOTHING BY MOUTH EXCEPT CLEAR LIQUIDS UNTIL       0430am   . PLEASE FINISH ENSURE DRINK PER SURGEON ORDER  WHICH NEEDS TO BE COMPLETED AT  0430am     .      CLEAR LIQUID DIET   Foods Allowed                                                                    Coffee and tea, regular and decaf                            Fruit ices (not with fruit pulp)                                      Iced Popsicles                                    Carbonated beverages, regular and diet                                    Cranberry, grape and apple juices Sports drinks like Gatorade Lightly seasoned clear broth or consume(fat free) Sugar, honey syrup ___________________________________________________________________      BRUSH YOUR TEETH MORNING OF SURGERY AND RINSE YOUR MOUTH OUT, NO CHEWING GUM CANDY OR MINTS.     Take these medicines the morning of surgery with A SIP OF WATER:  Protonix, amlodipine, gabapentin, lamictal, synthroid  DO NOT TAKE ANY DIABETIC MEDICATIONS DAY OF YOUR SURGERY                               You may not have any metal on your body including hair pins and              piercings  Do  not wear jewelry, make-up, lotions, powders or perfumes, deodorant  Do not wear nail polish on your fingernails.  Do not shave  48 hours prior to surgery.              Men may shave face and neck.   Do not bring valuables to the hospital. [place].  Contacts, dentures or bridgework may not be worn into surgery.  Leave suitcase in the car. After surgery it may be brought to your room.     Patients discharged the day of surgery will not be allowed to drive home. IF YOU ARE HAVING SURGERY AND GOING HOME THE SAME DAY, YOU MUST HAVE AN ADULT TO DRIVE YOU HOME AND BE WITH YOU FOR 24 HOURS. YOU MAY GO HOME BY TAXI OR UBER OR ORTHERWISE, BUT AN ADULT MUST ACCOMPANY YOU HOME AND STAY WITH YOU FOR 24 HOURS.  Name and phone number of your driver:  Special Instructions: N/A              Please read over the following fact sheets you were given: _____________________________________________________________________  [facility] - Preparing for Surgery Before surgery, you can play an important role.  Because skin is not sterile, your skin needs to be as free of germs as possible.  You can reduce the number of germs on your skin by washing with CHG (chlorahexidine gluconate) soap before surgery.  CHG is an antiseptic cleaner which kills germs and bonds with the skin to continue killing germs even after washing. Please DO NOT use if you have an allergy to CHG or antibacterial soaps.  If your skin becomes reddened/irritated stop using the CHG and inform your nurse when you arrive at Short Stay. Do not shave (including legs and underarms) for at least 48 hours prior to the first CHG shower.  You may shave your face/neck. Please follow these instructions carefully:  1.  Shower with CHG Soap the night before surgery and the  morning of Surgery.  2.  If you choose to wash your hair, wash your hair first as usual with your  normal  shampoo.  3.  After you  shampoo, rinse your hair and body thoroughly to remove the  shampoo.                           4.  Use CHG as you would any other liquid soap.  You can apply chg directly  to the skin and wash                       Gently with a scrungie or clean washcloth.  5.  Apply the CHG Soap to your body ONLY FROM THE NECK DOWN.   Do not use on face/ open                           Wound or open sores. Avoid contact with eyes, ears mouth and genitals (private parts).                       Wash face,  Genitals (private parts) with your normal soap.             6.  Wash thoroughly, paying special attention to the area where your surgery  will be performed.  7.  Thoroughly rinse your body with  warm water from the neck down.  8.  DO NOT shower/wash with your normal soap after using and rinsing off  the CHG Soap.                9.  Pat yourself dry with a clean towel.            10.  Wear clean pajamas.            11.  Place clean sheets on your bed the night of your first shower and do not  sleep with pets. Day of Surgery : Do not apply any lotions/deodorants the morning of surgery.  Please wear clean clothes to the hospital/surgery center.  FAILURE TO FOLLOW THESE INSTRUCTIONS MAY RESULT IN THE CANCELLATION OF YOUR SURGERY PATIENT SIGNATURE_________________________________  NURSE SIGNATURE__________________________________  ________________________________________________________________________

## 2021-01-12 ENCOUNTER — Emergency Department (HOSPITAL_BASED_OUTPATIENT_CLINIC_OR_DEPARTMENT_OTHER)
Admission: EM | Admit: 2021-01-12 | Discharge: 2021-01-12 | Disposition: A | Discharge status: Home / Self Care | Payer: Medicare Other | Attending: Emergency Medicine | Admitting: Emergency Medicine

## 2021-01-12 ENCOUNTER — Encounter (HOSPITAL_COMMUNITY): Payer: Self-pay

## 2021-01-12 ENCOUNTER — Other Ambulatory Visit: Payer: Self-pay

## 2021-01-12 ENCOUNTER — Encounter (HOSPITAL_COMMUNITY)
Admission: RE | Admit: 2021-01-12 | Discharge: 2021-01-12 | Disposition: A | Discharge status: Home / Self Care | Payer: Medicare Other | Source: Ambulatory Visit | Attending: Orthopedic Surgery | Admitting: Orthopedic Surgery

## 2021-01-12 ENCOUNTER — Encounter (HOSPITAL_BASED_OUTPATIENT_CLINIC_OR_DEPARTMENT_OTHER): Payer: Self-pay

## 2021-01-12 DIAGNOSIS — Z794 Long term (current) use of insulin: Secondary | ICD-10-CM | POA: Diagnosis not present

## 2021-01-12 DIAGNOSIS — Z79899 Other long term (current) drug therapy: Secondary | ICD-10-CM | POA: Insufficient documentation

## 2021-01-12 DIAGNOSIS — I10 Essential (primary) hypertension: Secondary | ICD-10-CM | POA: Insufficient documentation

## 2021-01-12 DIAGNOSIS — E876 Hypokalemia: Secondary | ICD-10-CM | POA: Diagnosis not present

## 2021-01-12 DIAGNOSIS — Z87891 Personal history of nicotine dependence: Secondary | ICD-10-CM | POA: Diagnosis not present

## 2021-01-12 DIAGNOSIS — Z01812 Encounter for preprocedural laboratory examination: Secondary | ICD-10-CM | POA: Insufficient documentation

## 2021-01-12 DIAGNOSIS — E039 Hypothyroidism, unspecified: Secondary | ICD-10-CM | POA: Insufficient documentation

## 2021-01-12 DIAGNOSIS — Z96652 Presence of left artificial knee joint: Secondary | ICD-10-CM | POA: Diagnosis not present

## 2021-01-12 HISTORY — DX: Tremor, unspecified: R25.1

## 2021-01-12 HISTORY — DX: Fibromyalgia: M79.7

## 2021-01-12 HISTORY — DX: Prediabetes: R73.03

## 2021-01-12 LAB — COMPREHENSIVE METABOLIC PANEL
ALT: 11 U/L (ref 0–44)
AST: 15 U/L (ref 15–41)
Albumin: 3.9 g/dL (ref 3.5–5.0)
Alkaline Phosphatase: 83 U/L (ref 38–126)
Anion gap: 9 (ref 5–15)
BUN: 10 mg/dL (ref 8–23)
CO2: 27 mmol/L (ref 22–32)
Calcium: 9.8 mg/dL (ref 8.9–10.3)
Chloride: 100 mmol/L (ref 98–111)
Creatinine, Ser: 1.2 mg/dL — ABNORMAL HIGH (ref 0.44–1.00)
GFR, Estimated: 50 mL/min — ABNORMAL LOW (ref >60)
Glucose, Bld: 131 mg/dL — ABNORMAL HIGH (ref 70–99)
Potassium: 2.8 mmol/L — ABNORMAL LOW (ref 3.5–5.1)
Sodium: 136 mmol/L (ref 135–145)
Total Bilirubin: 0.3 mg/dL (ref 0.3–1.2)
Total Protein: 7.2 g/dL (ref 6.5–8.1)

## 2021-01-12 LAB — CBC
HCT: 41.1 % (ref 36.0–46.0)
Hemoglobin: 13.5 g/dL (ref 12.0–15.0)
MCH: 28.9 pg (ref 26.0–34.0)
MCHC: 32.8 g/dL (ref 30.0–36.0)
MCV: 88 fL (ref 80.0–100.0)
Platelets: 408 10*3/uL — ABNORMAL HIGH (ref 150–400)
RBC: 4.67 MIL/uL (ref 3.87–5.11)
RDW: 13.9 % (ref 11.5–15.5)
WBC: 7.3 10*3/uL (ref 4.0–10.5)
nRBC: 0 % (ref 0.0–0.2)

## 2021-01-12 LAB — BASIC METABOLIC PANEL
Anion gap: 8 (ref 5–15)
BUN: 10 mg/dL (ref 8–23)
CO2: 27 mmol/L (ref 22–32)
Calcium: 9.8 mg/dL (ref 8.9–10.3)
Chloride: 101 mmol/L (ref 98–111)
Creatinine, Ser: 1.17 mg/dL — ABNORMAL HIGH (ref 0.44–1.00)
GFR, Estimated: 51 mL/min — ABNORMAL LOW (ref >60)
Glucose, Bld: 104 mg/dL — ABNORMAL HIGH (ref 70–99)
Potassium: 2.8 mmol/L — ABNORMAL LOW (ref 3.5–5.1)
Sodium: 136 mmol/L (ref 135–145)

## 2021-01-12 LAB — GLUCOSE, CAPILLARY: Glucose-Capillary: 106 mg/dL — ABNORMAL HIGH (ref 70–99)

## 2021-01-12 LAB — SURGICAL PCR SCREEN
MRSA, PCR: NEGATIVE
Staphylococcus aureus: NEGATIVE

## 2021-01-12 LAB — MAGNESIUM: Magnesium: 2 mg/dL (ref 1.7–2.4)

## 2021-01-12 LAB — HEMOGLOBIN A1C
Hgb A1c MFr Bld: 5.2 % (ref 4.8–5.6)
Mean Plasma Glucose: 102.54 mg/dL

## 2021-01-12 MED ORDER — POTASSIUM CHLORIDE CRYS ER 20 MEQ PO TBCR: 20.0000 meq | EXTENDED_RELEASE_TABLET | Freq: Two times a day (BID) | ORAL | 0 refills | Status: DC

## 2021-01-12 MED ORDER — POTASSIUM CHLORIDE 10 MEQ/100ML IV SOLN
10.0000 meq | INTRAVENOUS | Status: AC
  Administered 2021-01-12 (×2): 10 meq via INTRAVENOUS
  Filled 2021-01-12 (×2): qty 100

## 2021-01-12 MED ORDER — POTASSIUM CHLORIDE CRYS ER 20 MEQ PO TBCR
40.0000 meq | EXTENDED_RELEASE_TABLET | Freq: Once | ORAL | Status: AC
  Administered 2021-01-12: 40 meq via ORAL
  Filled 2021-01-12: qty 2

## 2021-01-12 MED ORDER — MAGNESIUM SULFATE 50 % IJ SOLN
1.0000 g | Freq: Once | INTRAMUSCULAR | Status: AC
  Administered 2021-01-12: 1 g via INTRAVENOUS
  Filled 2021-01-12: qty 2

## 2021-01-12 NOTE — Progress Notes (Addendum)
Anesthesia Review:  PCP: DR Ulanda Edison  Requested LOV note they are to fax On chart dated 12/09/2020 and 10/03/2020 .  Cardiologist : DR Terri Skains- LOV 12/15/20  on chart. Requested cardiac clearance form surgeon office.  They are to fax.  On chart dated 01/07/2021.   Chest x-ray : EKG : 12/15/20  Echo : 12/20/20 on chart  Stress test: 01/03/2021  Cardiac Cath :  Activity level: cannot do a flight of stairs without difficulty  Sleep Study/ CPAP : has cpap  Fasting Blood Sugar :      / Checks Blood Sugar -- times a day:   Blood Thinner/ Instructions /Last Dose: ASA / Instructions/ Last Dose :   PreDiabetes per pt checks glucose occasionally at home  Hgba1c-01/12/21- 5.2  BMp on 01/12/21 with Potassium of 2.8 routed to Dr Thea Alken.  Audria Nine made aware.

## 2021-01-12 NOTE — ED Provider Notes (Signed)
[facility] EMERGENCY DEPARTMENT Provider Note   CSN: [id number] Arrival date & time: 01/12/21  1703     History Chief Complaint  Patient presents with   Abnormal Lab    Christine Robles is a 65 y.o. female.   Abnormal Lab  Patient presents with low potassium level.  She was seen by orthopedic physician for a preoperative appointment, was sent to the ED due to hypokalemia.  He was hypokalemic to 2.8, she is asymptomatic and denies any other concerns.  She has no history of hypokalemia, she is prediabetic.  She has pain to the right knee, but denies any other acute complaints.  No alleviating, no aggravating factors.   Past Medical History:  Diagnosis Date   Anxiety    Bipolar disorder ([place])    "vs ADHD" (09/26/2016)   Candida esophagitis (HCC)    Chronic lower back pain    Depression    Esophagitis    Fibromyalgia    Gastroparesis    Headache    "q 4 months now" (09/26/2016)   Hypertension    Hypothyroidism    IBS (irritable bowel syndrome)    Migraine    "maybe 1-2/year" (09/26/2016)   Occasional tremors    pt reports hx of hands shaking per pt that come and go   OSA on CPAP    Panic disorder    Pre-diabetes    Rheumatoid arthritis ([place])    "all over" (09/26/2016)   Spinal stenosis    Thyroid disease    hypothyroidism    Patient Active Problem List   Diagnosis Date Noted   Osteoarthritis of right knee 01/05/2021   Slow transit constipation 03/18/2018   Primary localized osteoarthritis of left knee 09/26/2016   Streptococcal sore throat 08/10/2012    Class: Acute   Hypothyroidism 03/08/2009   PANIC DISORDER 03/08/2009   DEPRESSION 03/08/2009   Essential hypertension 03/08/2009   GERD 03/08/2009   SPINAL STENOSIS 03/08/2009   IRRITABLE BOWEL SYNDROME, HX OF 03/08/2009    Past Surgical History:  Procedure Laterality Date   ABDOMINAL HERNIA REPAIR  02/01/2011   [facility]   BREAST BIOPSY Left    15+ years ago no visual scars, 2x   CARDIAC  CATHETERIZATION  2012   DILATION AND CURETTAGE OF UTERUS     KNEE ARTHROSCOPY Left 01/24/2006   hx/notes 10/17/2010   LAPAROSCOPIC CHOLECYSTECTOMY  05/2001   hx/notes 10/17/2010   LIPOMA EXCISION Right 12/2001   thigh, hx/notes 10/17/2010   LUMBAR DISC SURGERY  1990s   POSTERIOR LUMBAR FUSION  12/2008   "L4, L5, S1"   TONSILLECTOMY     TOTAL KNEE ARTHROPLASTY Left 09/26/2016   Procedure: LEFT TOTAL KNEE ARTHROPLASTY;  Surgeon: Ninetta Lights, MD;  Location: [facility];  Service: Orthopedics;  Laterality: Left;   TUBAL LIGATION       OB History   No obstetric history on file.     Family History  Problem Relation Age of Onset   Leukemia Mother 78   Hypertension Father    Heart disease Father    Cirrhosis Father    Crohn's disease Father    Heart disease Brother        pacemaker   Peripheral Artery Disease Brother    Hypertension Brother    Heart disease Maternal Grandmother    Diabetes Brother    Diabetes Maternal Grandfather    Colon cancer Neg Hx     Social History   Tobacco Use   Smoking status:  Former    Packs/day: 1.00    Years: 15.00    Pack years: 15.00    Types: Cigarettes    Quit date: 08/08/1998    Years since quitting: 22.4   Smokeless tobacco: Never  Vaping Use   Vaping Use: Never used  Substance Use Topics   Alcohol use: Not Currently   Drug use: Never    Home Medications Prior to Admission medications   Medication Sig Start Date End Date Taking? Authorizing Provider  ALPRAZolam Duanne Moron) 1 MG tablet Take 0.5-1 mg by mouth 2 (two) times daily as needed for anxiety.    [provider]  amLODipine (NORVASC) 5 MG tablet Take 5 mg by mouth in the morning. 09/05/20   [provider]  atorvastatin (LIPITOR) 20 MG tablet Take 20 mg by mouth daily.    [provider]  buPROPion ER (WELLBUTRIN SR) 100 MG 12 hr tablet Take 100 mg by mouth in the morning.    [provider]  Cholecalciferol (VITAMIN D3) 50000 units CAPS Take 50,000  Units by mouth every Thursday. 02/11/18   [provider]  diclofenac Sodium (VOLTAREN) 1 % GEL Apply 1 application topically 4 (four) times daily as needed (pain.).    [provider]  FLUoxetine (PROZAC) 20 MG capsule Take 20 mg by mouth daily with breakfast.    [provider]  gabapentin (NEURONTIN) 400 MG capsule Take 1,200 mg by mouth at bedtime. 12/05/20   [provider]  lamoTRIgine (LAMICTAL) 200 MG tablet Take 200 mg by mouth in the morning. 12/09/17   [provider]  Levomefolate Glucosamine (METHYLFOLATE PO) Take 15 mg by mouth in the morning.    [provider]  levothyroxine (SYNTHROID, LEVOTHROID) 112 MCG tablet Take 112 mcg by mouth daily before breakfast. 02/25/18   [provider]  lithium carbonate (ESKALITH) 450 MG CR tablet Take 450 mg by mouth at bedtime.    [provider]  Melatonin 10 MG TABS Take 10 mg by mouth at bedtime.    [provider]  meloxicam (MOBIC) 15 MG tablet Take 15 mg by mouth every morning. 12/05/20   [provider]  metoCLOPramide (REGLAN) 10 MG tablet Take 1 tablet (10 mg total) by mouth every 8 (eight) hours as needed for nausea. 03/25/18   Rozier, Richard T, MD  OLANZapine (ZYPREXA) 10 MG tablet Take 10 mg by mouth at bedtime.    [provider]  Oxycodone HCl 10 MG TABS Take 10 mg by mouth every 4 (four) hours. 08/07/16   [provider]  pantoprazole (PROTONIX) 40 MG tablet TAKE 1 TABLET BY MOUTH TWICE DAILY Patient taking differently: Take 40 mg by mouth daily. 09/20/14   Lafayette Dragon, MD  Polyethyl Glycol-Propyl Glycol 0.4-0.3 % SOLN Place 1-2 drops into both eyes 3 (three) times daily as needed (dry/irritated eyes).    [provider]  RELISTOR 150 MG TABS Take 150 mg by mouth in the morning, at noon, and at bedtime. 03/16/18   [provider]  Semaglutide,0.25 or 0.'5MG'$ /DOS, (OZEMPIC, 0.25 OR 0.5 MG/DOSE,) 2 MG/1.5ML SOPN Inject  0.25 mg into the skin every Friday. 10/27/20   [provider]  SIMPONI 50 MG/0.5ML SOAJ Inject 50 mg into the skin every 30 (thirty) days. 12/25/20   [provider]  tiZANidine (ZANAFLEX) 2 MG tablet Take 2 mg by mouth 3 (three) times daily as needed for muscle spasms.    [provider]  triamterene-hydrochlorothiazide [facility]) 37.5-25  MG per tablet Take 1 each (1 tablet total) by mouth daily. 08/14/12   Patrecia Pour, NP    Allergies    Escitalopram oxalate, Buspirone hcl, Fentanyl, and Morphine and related  Review of Systems   Review of Systems  Constitutional:  Negative for fever.  Respiratory:  Negative for shortness of breath.   Cardiovascular:  Negative for chest pain.  Gastrointestinal:  Negative for abdominal pain.  Musculoskeletal:  Positive for arthralgias.  Neurological:  Negative for dizziness and weakness.   Physical Exam Updated Vital Signs BP (!) 145/87   Pulse 82   Temp 98.9 F (37.2 C) (Oral)   Resp 18   Ht '5\' 6"'$  (1.676 m)   Wt 100.7 kg   LMP 08/08/1994   SpO2 96%   BMI 35.83 kg/m   Physical Exam Vitals and nursing note reviewed. Exam conducted with a chaperone present.  Constitutional:      Appearance: She is obese.  HENT:     Head: Normocephalic and atraumatic.  Eyes:     General: No scleral icterus.       Right eye: No discharge.        Left eye: No discharge.     Extraocular Movements: Extraocular movements intact.     Pupils: Pupils are equal, round, and reactive to light.  Cardiovascular:     Rate and Rhythm: Normal rate and regular rhythm.     Pulses: Normal pulses.     Heart sounds: Normal heart sounds. No murmur heard.   No friction rub. No gallop.  Pulmonary:     Effort: Pulmonary effort is normal. No respiratory distress.     Breath sounds: Normal breath sounds.  Abdominal:     General: Abdomen is flat. Bowel sounds are normal. There is no distension.     Palpations: Abdomen is soft.     Tenderness:  There is no abdominal tenderness.  Skin:    General: Skin is warm and dry.     Coloration: Skin is not jaundiced.  Neurological:     Mental Status: She is alert. Mental status is at baseline.     Coordination: Coordination normal.    ED Results / Procedures / Treatments   Labs (all labs ordered are listed, but only abnormal results are displayed) Labs Reviewed  COMPREHENSIVE METABOLIC PANEL - Abnormal; Notable for the following components:      Result Value   Potassium 2.8 (*)    Glucose, Bld 131 (*)    Creatinine, Ser 1.20 (*)    GFR, Estimated 50 (*)    All other components within normal limits  MAGNESIUM    EKG None  Radiology No results found.  Procedures Procedures   Medications Ordered in ED Medications  potassium chloride 10 mEq in 100 mL IVPB (has no administration in time range)  potassium chloride SA (KLOR-CON) CR tablet 40 mEq (has no administration in time range)  magnesium sulfate (IV Push/IM) injection 1 g (has no administration in time range)    ED Course  I have reviewed the triage vital signs and the nursing notes.  Pertinent labs & imaging results that were available during my care of the patient were reviewed by me and considered in my medical decision making (see chart for details).    MDM Rules/Calculators/A&P                           Patient sent with hypokalemia, confirmed with lab testing.  She is asymptomatic, do not see reason to work-up with other labs at this time.  We will replenish her potassium levels and check a mag level, will give her 1 g in case she has hypomagnesium as well.  Potassium replenished.  Patient is resting comfortably, will discharge and have her follow-up with PCP early next week.  Return precautions discussed and agreed upon.  Her vitals are stable, she is appropriate discharge at this time.  Final Clinical Impression(s) / ED Diagnoses Final diagnoses:  None    Rx / DC Orders ED Discharge Orders     None         Sherrill Raring, Hershal Coria 01/12/21 2034    Lennice Sites, DO 01/12/21 2246

## 2021-01-12 NOTE — Discharge Instructions (Signed)
Please take 40 mg of potassium twice daily for the next 7 days.  Please follow-up with your primary care doctor early next week to recheck your for low potassium level.  You were very low in potassium, so we are prescribing oral supplements.  This can interact with your potassium sparing diuretic, the side effect would be increase levels of potassium that can cause heart arrhythmia.  Although this is less likely for short-term dose, if you start to have palpitations or chest pain please discontinue taking the additional potassium.

## 2021-01-12 NOTE — ED Notes (Signed)
Called lab again to add mag to previous labs

## 2021-01-12 NOTE — ED Triage Notes (Addendum)
Pt reports she had preop labs today per PCP-was called and advised to come to ED for low K+ pt NAD-to triage in w/c

## 2021-01-16 DIAGNOSIS — E876 Hypokalemia: Secondary | ICD-10-CM | POA: Diagnosis not present

## 2021-01-16 NOTE — Care Plan (Signed)
Ortho Bundle Case Management Note  Patient Details  Name: Christine Robles MRN: [id number] Date of Birth: 05/11/1956    Met with patient in the office prior to surgery. She will discharge to home with friend to assist. Has rolling walker at home. HHPT referral to [facility] and OPPT set up with [facility].  Patient and MD in agreement with plan. Choice offered                DME Arranged:    DME Agency:     HH Arranged:  PT HH Agency:  [facility]  Additional Comments: Please contact me with any questions of if this plan should need to change.  Ladell Heads,  [place] [facility] Specialist  [phone] 01/16/2021, 10:44 AM

## 2021-01-20 ENCOUNTER — Other Ambulatory Visit: Payer: Self-pay | Admitting: Orthopedic Surgery

## 2021-01-20 LAB — SARS CORONAVIRUS 2 (TAT 6-24 HRS): SARS Coronavirus 2: NEGATIVE

## 2021-01-23 NOTE — Anesthesia Preprocedure Evaluation (Addendum)
Anesthesia Evaluation  Patient identified by MRN, date of birth, ID band Patient awake    Reviewed: Allergy & Precautions, NPO status , Patient's Chart, lab work & pertinent test results  History of Anesthesia Complications Negative for: history of anesthetic complications  Airway Mallampati: III  TM Distance: >3 FB Neck ROM: Full    Dental  (+) Dental Advisory Given, Edentulous Upper, Edentulous Lower   Pulmonary sleep apnea , former smoker,    Pulmonary exam normal        Cardiovascular hypertension, Pt. on medications Normal cardiovascular exam     Neuro/Psych  Headaches, PSYCHIATRIC DISORDERS Anxiety Depression Bipolar Disorder    GI/Hepatic Neg liver ROS, GERD  Controlled and Medicated, IBS    Endo/Other  Hypothyroidism  K 2.8 - sent to ED and repleted, level not rechecked Pre-DM Obesity   Renal/GU Renal InsufficiencyRenal disease     Musculoskeletal  (+) Arthritis , Fibromyalgia -, narcotic dependent  Abdominal   Peds  Hematology negative hematology ROS (+)  Plt 408k    Anesthesia Other Findings Covid test negative   Reproductive/Obstetrics                           Anesthesia Physical Anesthesia Plan  ASA: 3  Anesthesia Plan: Spinal   Post-op Pain Management:  [facility]ional for Post-op pain   Induction:   PONV Risk Score and Plan: 2 and Treatment may vary due to age or medical condition and Propofol infusion  Airway Management Planned: Natural Airway and Simple Face Mask  Additional Equipment: None  Intra-op Plan:   Post-operative Plan:   Informed Consent: I have reviewed the patients History and Physical, chart, labs and discussed the procedure including the risks, benefits and alternatives for the proposed anesthesia with the patient or authorized representative who has indicated his/her understanding and acceptance.       Plan Discussed with: CRNA and  Anesthesiologist  Anesthesia Plan Comments: (Labs reviewed, platelets acceptable. Discussed risks and benefits of spinal, including spinal/epidural hematoma, infection, failed block, and PDPH. Patient expressed understanding and wished to proceed. )       Anesthesia Quick Evaluation

## 2021-01-24 ENCOUNTER — Other Ambulatory Visit: Payer: Self-pay

## 2021-01-24 ENCOUNTER — Encounter (HOSPITAL_COMMUNITY): Payer: Self-pay | Admitting: Orthopedic Surgery

## 2021-01-24 ENCOUNTER — Observation Stay (HOSPITAL_COMMUNITY)
Admission: RE | Admit: 2021-01-24 | Discharge: 2021-01-25 | Disposition: A | Discharge status: Home / Self Care | Payer: Medicare Other | Attending: Orthopedic Surgery | Admitting: Orthopedic Surgery

## 2021-01-24 ENCOUNTER — Observation Stay (HOSPITAL_COMMUNITY): Payer: Medicare Other

## 2021-01-24 ENCOUNTER — Ambulatory Visit (HOSPITAL_COMMUNITY): Payer: Medicare Other | Admitting: Physician Assistant

## 2021-01-24 ENCOUNTER — Ambulatory Visit (HOSPITAL_COMMUNITY): Payer: Medicare Other | Admitting: Anesthesiology

## 2021-01-24 ENCOUNTER — Encounter (HOSPITAL_COMMUNITY)
Admission: RE | Disposition: A | Discharge status: Home / Self Care | Payer: Self-pay | Source: Home / Self Care | Attending: Orthopedic Surgery

## 2021-01-24 DIAGNOSIS — Z96651 Presence of right artificial knee joint: Secondary | ICD-10-CM

## 2021-01-24 DIAGNOSIS — M1711 Unilateral primary osteoarthritis, right knee: Principal | ICD-10-CM

## 2021-01-24 DIAGNOSIS — E039 Hypothyroidism, unspecified: Secondary | ICD-10-CM | POA: Insufficient documentation

## 2021-01-24 DIAGNOSIS — Z79899 Other long term (current) drug therapy: Secondary | ICD-10-CM | POA: Insufficient documentation

## 2021-01-24 DIAGNOSIS — R7303 Prediabetes: Secondary | ICD-10-CM | POA: Diagnosis not present

## 2021-01-24 DIAGNOSIS — G8918 Other acute postprocedural pain: Secondary | ICD-10-CM | POA: Diagnosis not present

## 2021-01-24 DIAGNOSIS — Z96652 Presence of left artificial knee joint: Secondary | ICD-10-CM | POA: Diagnosis not present

## 2021-01-24 DIAGNOSIS — Z9989 Dependence on other enabling machines and devices: Secondary | ICD-10-CM | POA: Diagnosis not present

## 2021-01-24 DIAGNOSIS — Z87891 Personal history of nicotine dependence: Secondary | ICD-10-CM | POA: Insufficient documentation

## 2021-01-24 DIAGNOSIS — I10 Essential (primary) hypertension: Secondary | ICD-10-CM | POA: Diagnosis not present

## 2021-01-24 DIAGNOSIS — Z471 Aftercare following joint replacement surgery: Secondary | ICD-10-CM | POA: Diagnosis not present

## 2021-01-24 DIAGNOSIS — G4733 Obstructive sleep apnea (adult) (pediatric): Secondary | ICD-10-CM | POA: Diagnosis not present

## 2021-01-24 DIAGNOSIS — T8182XA Emphysema (subcutaneous) resulting from a procedure, initial encounter: Secondary | ICD-10-CM | POA: Diagnosis not present

## 2021-01-24 HISTORY — PX: TOTAL KNEE ARTHROPLASTY: SHX125

## 2021-01-24 LAB — POCT I-STAT, CHEM 8
BUN: 8 mg/dL (ref 8–23)
Calcium, Ion: 1.35 mmol/L (ref 1.15–1.40)
Chloride: 103 mmol/L (ref 98–111)
Creatinine, Ser: 1.4 mg/dL — ABNORMAL HIGH (ref 0.44–1.00)
Glucose, Bld: 104 mg/dL — ABNORMAL HIGH (ref 70–99)
HCT: 36 % (ref 36.0–46.0)
Hemoglobin: 12.2 g/dL (ref 12.0–15.0)
Potassium: 3.3 mmol/L — ABNORMAL LOW (ref 3.5–5.1)
Sodium: 138 mmol/L (ref 135–145)
TCO2: 27 mmol/L (ref 22–32)

## 2021-01-24 LAB — GLUCOSE, CAPILLARY: Glucose-Capillary: 107 mg/dL — ABNORMAL HIGH (ref 70–99)

## 2021-01-24 SURGERY — ARTHROPLASTY, KNEE, TOTAL
Anesthesia: Spinal | Site: Knee | Laterality: Right

## 2021-01-24 MED ORDER — HYDROMORPHONE HCL 1 MG/ML IJ SOLN: 0.5000 mg | INTRAMUSCULAR | Status: DC | PRN

## 2021-01-24 MED ORDER — DEXAMETHASONE SODIUM PHOSPHATE 10 MG/ML IJ SOLN
INTRAMUSCULAR | Status: AC
  Filled 2021-01-24: qty 1

## 2021-01-24 MED ORDER — FLEET ENEMA 7-19 GM/118ML RE ENEM: 1.0000 | ENEMA | Freq: Once | RECTAL | Status: DC | PRN

## 2021-01-24 MED ORDER — ATORVASTATIN CALCIUM 20 MG PO TABS
20.0000 mg | ORAL_TABLET | Freq: Every day | ORAL | Status: DC
  Administered 2021-01-24 – 2021-01-25 (×2): 20 mg via ORAL
  Filled 2021-01-24 (×2): qty 1

## 2021-01-24 MED ORDER — BISACODYL 10 MG RE SUPP: 10.0000 mg | Freq: Every day | RECTAL | Status: DC | PRN

## 2021-01-24 MED ORDER — ONDANSETRON HCL 4 MG/2ML IJ SOLN
INTRAMUSCULAR | Status: DC | PRN
  Administered 2021-01-24: 4 mg via INTRAVENOUS

## 2021-01-24 MED ORDER — PANTOPRAZOLE SODIUM 40 MG PO TBEC
40.0000 mg | DELAYED_RELEASE_TABLET | Freq: Two times a day (BID) | ORAL | Status: DC
  Administered 2021-01-24 – 2021-01-25 (×2): 40 mg via ORAL
  Filled 2021-01-24 (×2): qty 1

## 2021-01-24 MED ORDER — POLYETHYLENE GLYCOL 3350 17 G PO PACK: 17.0000 g | PACK | Freq: Every day | ORAL | Status: DC | PRN

## 2021-01-24 MED ORDER — LAMOTRIGINE 100 MG PO TABS
200.0000 mg | ORAL_TABLET | Freq: Every morning | ORAL | Status: DC
  Administered 2021-01-25: 200 mg via ORAL
  Filled 2021-01-24: qty 2

## 2021-01-24 MED ORDER — ALUM & MAG HYDROXIDE-SIMETH 200-200-20 MG/5ML PO SUSP: 30.0000 mL | ORAL | Status: DC | PRN

## 2021-01-24 MED ORDER — ONDANSETRON HCL 4 MG PO TABS: 4.0000 mg | ORAL_TABLET | Freq: Four times a day (QID) | ORAL | Status: DC | PRN

## 2021-01-24 MED ORDER — BUPIVACAINE IN DEXTROSE 0.75-8.25 % IT SOLN
INTRATHECAL | Status: DC | PRN
  Administered 2021-01-24: 1.8 mL via INTRATHECAL

## 2021-01-24 MED ORDER — TRANEXAMIC ACID-NACL 1000-0.7 MG/100ML-% IV SOLN
1000.0000 mg | INTRAVENOUS | Status: AC
  Administered 2021-01-24: 1000 mg via INTRAVENOUS
  Filled 2021-01-24: qty 100

## 2021-01-24 MED ORDER — FLUOXETINE HCL 20 MG PO CAPS
20.0000 mg | ORAL_CAPSULE | Freq: Every day | ORAL | Status: DC
  Administered 2021-01-25: 20 mg via ORAL
  Filled 2021-01-24: qty 1

## 2021-01-24 MED ORDER — CHLORHEXIDINE GLUCONATE 0.12 % MT SOLN
15.0000 mL | Freq: Once | OROMUCOSAL | Status: AC
  Administered 2021-01-24: 15 mL via OROMUCOSAL

## 2021-01-24 MED ORDER — ONDANSETRON HCL 4 MG/2ML IJ SOLN: 4.0000 mg | Freq: Once | INTRAMUSCULAR | Status: DC | PRN

## 2021-01-24 MED ORDER — WATER FOR IRRIGATION, STERILE IR SOLN
Status: DC | PRN
  Administered 2021-01-24: 2000 mL

## 2021-01-24 MED ORDER — PHENYLEPHRINE HCL-NACL 20-0.9 MG/250ML-% IV SOLN
INTRAVENOUS | Status: DC | PRN
  Administered 2021-01-24: 25 ug/min via INTRAVENOUS

## 2021-01-24 MED ORDER — TRIAMTERENE-HCTZ 37.5-25 MG PO TABS
1.0000 | ORAL_TABLET | Freq: Every day | ORAL | Status: DC
  Administered 2021-01-25: 1 via ORAL
  Filled 2021-01-24: qty 1

## 2021-01-24 MED ORDER — SODIUM CHLORIDE 0.9 % IR SOLN
Status: DC | PRN
  Administered 2021-01-24: 3000 mL

## 2021-01-24 MED ORDER — ALPRAZOLAM 0.5 MG PO TABS: 0.5000 mg | ORAL_TABLET | Freq: Two times a day (BID) | ORAL | Status: DC | PRN

## 2021-01-24 MED ORDER — OXYCODONE HCL 5 MG/5ML PO SOLN: 5.0000 mg | Freq: Once | ORAL | Status: DC | PRN

## 2021-01-24 MED ORDER — ASPIRIN EC 325 MG PO TBEC
325.0000 mg | DELAYED_RELEASE_TABLET | Freq: Every day | ORAL | Status: DC
  Administered 2021-01-25: 325 mg via ORAL
  Filled 2021-01-24: qty 1

## 2021-01-24 MED ORDER — ONDANSETRON HCL 4 MG/2ML IJ SOLN
INTRAMUSCULAR | Status: AC
  Filled 2021-01-24: qty 2

## 2021-01-24 MED ORDER — FENTANYL CITRATE (PF) 100 MCG/2ML IJ SOLN
INTRAMUSCULAR | Status: AC
  Filled 2021-01-24: qty 2

## 2021-01-24 MED ORDER — POLYETHYL GLYCOL-PROPYL GLYCOL 0.4-0.3 % OP SOLN: 1.0000 [drp] | Freq: Three times a day (TID) | OPHTHALMIC | Status: DC | PRN

## 2021-01-24 MED ORDER — CEFAZOLIN SODIUM-DEXTROSE 2-4 GM/100ML-% IV SOLN
2.0000 g | INTRAVENOUS | Status: AC
  Administered 2021-01-24: 2 g via INTRAVENOUS
  Filled 2021-01-24: qty 100

## 2021-01-24 MED ORDER — ORAL CARE MOUTH RINSE: 15.0000 mL | Freq: Once | OROMUCOSAL | Status: AC

## 2021-01-24 MED ORDER — LACTATED RINGERS IV SOLN: INTRAVENOUS | Status: DC

## 2021-01-24 MED ORDER — OXYCODONE HCL 5 MG PO TABS: 5.0000 mg | ORAL_TABLET | Freq: Once | ORAL | Status: DC | PRN

## 2021-01-24 MED ORDER — PROPOFOL 10 MG/ML IV BOLUS
INTRAVENOUS | Status: DC | PRN
  Administered 2021-01-24: 20 mg via INTRAVENOUS
  Administered 2021-01-24 (×3): 30 mg via INTRAVENOUS

## 2021-01-24 MED ORDER — PROPOFOL 10 MG/ML IV BOLUS
INTRAVENOUS | Status: AC
  Filled 2021-01-24: qty 20

## 2021-01-24 MED ORDER — PROPOFOL 500 MG/50ML IV EMUL
INTRAVENOUS | Status: DC | PRN
  Administered 2021-01-24: 40 ug/kg/min via INTRAVENOUS

## 2021-01-24 MED ORDER — GLYCOPYRROLATE 0.2 MG/ML IJ SOLN
INTRAMUSCULAR | Status: AC
  Filled 2021-01-24: qty 1

## 2021-01-24 MED ORDER — GLYCOPYRROLATE PF 0.2 MG/ML IJ SOSY
PREFILLED_SYRINGE | INTRAMUSCULAR | Status: DC | PRN
  Administered 2021-01-24: .2 mg via INTRAVENOUS

## 2021-01-24 MED ORDER — MIDAZOLAM HCL 2 MG/2ML IJ SOLN
INTRAMUSCULAR | Status: DC | PRN
  Administered 2021-01-24: 2 mg via INTRAVENOUS

## 2021-01-24 MED ORDER — PHENOL 1.4 % MT LIQD: 1.0000 | OROMUCOSAL | Status: DC | PRN

## 2021-01-24 MED ORDER — METOCLOPRAMIDE HCL 5 MG/ML IJ SOLN: 5.0000 mg | Freq: Three times a day (TID) | INTRAMUSCULAR | Status: DC | PRN

## 2021-01-24 MED ORDER — DEXAMETHASONE SODIUM PHOSPHATE 10 MG/ML IJ SOLN
10.0000 mg | Freq: Once | INTRAMUSCULAR | Status: AC
  Administered 2021-01-25: 10 mg via INTRAVENOUS
  Filled 2021-01-24: qty 1

## 2021-01-24 MED ORDER — OXYCODONE HCL 5 MG PO TABS
10.0000 mg | ORAL_TABLET | ORAL | Status: DC
Start: 2021-01-24 — End: 2021-01-25
  Administered 2021-01-24 – 2021-01-25 (×8): 10 mg via ORAL
  Filled 2021-01-24 (×8): qty 2

## 2021-01-24 MED ORDER — PHENYLEPHRINE HCL (PRESSORS) 10 MG/ML IV SOLN
INTRAVENOUS | Status: AC
  Filled 2021-01-24: qty 2

## 2021-01-24 MED ORDER — ONDANSETRON HCL 4 MG/2ML IJ SOLN: 4.0000 mg | Freq: Four times a day (QID) | INTRAMUSCULAR | Status: DC | PRN

## 2021-01-24 MED ORDER — MIDAZOLAM HCL 2 MG/2ML IJ SOLN
INTRAMUSCULAR | Status: AC
  Filled 2021-01-24: qty 2

## 2021-01-24 MED ORDER — METHYLNALTREXONE BROMIDE 150 MG PO TABS: 150.0000 mg | ORAL_TABLET | Freq: Three times a day (TID) | ORAL | Status: DC

## 2021-01-24 MED ORDER — HYDROMORPHONE HCL 1 MG/ML IJ SOLN
0.5000 mg | INTRAMUSCULAR | Status: DC | PRN
  Administered 2021-01-24 (×2): 1 mg via INTRAVENOUS
  Filled 2021-01-24 (×2): qty 1

## 2021-01-24 MED ORDER — 0.9 % SODIUM CHLORIDE (POUR BTL) OPTIME
TOPICAL | Status: DC | PRN
  Administered 2021-01-24: 1000 mL

## 2021-01-24 MED ORDER — TRANEXAMIC ACID-NACL 1000-0.7 MG/100ML-% IV SOLN
1000.0000 mg | Freq: Once | INTRAVENOUS | Status: AC
  Administered 2021-01-24: 1000 mg via INTRAVENOUS
  Filled 2021-01-24: qty 100

## 2021-01-24 MED ORDER — OLANZAPINE 10 MG PO TABS
10.0000 mg | ORAL_TABLET | Freq: Every day | ORAL | Status: DC
  Administered 2021-01-24: 10 mg via ORAL
  Filled 2021-01-24: qty 1

## 2021-01-24 MED ORDER — TIZANIDINE HCL 4 MG PO TABS
2.0000 mg | ORAL_TABLET | Freq: Three times a day (TID) | ORAL | Status: DC | PRN
  Administered 2021-01-24: 2 mg via ORAL
  Filled 2021-01-24: qty 1

## 2021-01-24 MED ORDER — CEFAZOLIN SODIUM-DEXTROSE 2-4 GM/100ML-% IV SOLN
2.0000 g | Freq: Four times a day (QID) | INTRAVENOUS | Status: AC
  Administered 2021-01-24 (×2): 2 g via INTRAVENOUS
  Filled 2021-01-24 (×3): qty 100

## 2021-01-24 MED ORDER — GABAPENTIN 400 MG PO CAPS
1200.0000 mg | ORAL_CAPSULE | Freq: Every day | ORAL | Status: DC
  Administered 2021-01-24: 1200 mg via ORAL
  Filled 2021-01-24: qty 3

## 2021-01-24 MED ORDER — BUPIVACAINE-EPINEPHRINE (PF) 0.5% -1:200000 IJ SOLN
INTRAMUSCULAR | Status: DC | PRN
  Administered 2021-01-24: 30 mL via PERINEURAL

## 2021-01-24 MED ORDER — BUPIVACAINE HCL (PF) 0.25 % IJ SOLN
INTRAMUSCULAR | Status: AC
  Filled 2021-01-24: qty 30

## 2021-01-24 MED ORDER — POLYVINYL ALCOHOL 1.4 % OP SOLN
1.0000 [drp] | Freq: Three times a day (TID) | OPHTHALMIC | Status: DC | PRN
  Filled 2021-01-24: qty 15

## 2021-01-24 MED ORDER — ACETAMINOPHEN 325 MG PO TABS: 325.0000 mg | ORAL_TABLET | Freq: Four times a day (QID) | ORAL | Status: DC | PRN

## 2021-01-24 MED ORDER — POVIDONE-IODINE 10 % EX SWAB
2.0000 "application " | Freq: Once | CUTANEOUS | Status: AC
  Administered 2021-01-24: 2 via TOPICAL

## 2021-01-24 MED ORDER — POTASSIUM CHLORIDE IN NACL 20-0.45 MEQ/L-% IV SOLN
INTRAVENOUS | Status: DC
  Filled 2021-01-24 (×2): qty 1000

## 2021-01-24 MED ORDER — BUPIVACAINE HCL 0.25 % IJ SOLN
INTRAMUSCULAR | Status: DC | PRN
  Administered 2021-01-24: 30 mL

## 2021-01-24 MED ORDER — LEVOTHYROXINE SODIUM 112 MCG PO TABS
112.0000 ug | ORAL_TABLET | Freq: Every day | ORAL | Status: DC
  Administered 2021-01-25: 112 ug via ORAL
  Filled 2021-01-24: qty 1

## 2021-01-24 MED ORDER — ACETAMINOPHEN 500 MG PO TABS
1000.0000 mg | ORAL_TABLET | Freq: Four times a day (QID) | ORAL | Status: AC
  Administered 2021-01-24 – 2021-01-25 (×4): 1000 mg via ORAL
  Filled 2021-01-24 (×4): qty 2

## 2021-01-24 MED ORDER — LITHIUM CARBONATE ER 450 MG PO TBCR
450.0000 mg | EXTENDED_RELEASE_TABLET | Freq: Every day | ORAL | Status: DC
  Administered 2021-01-24: 450 mg via ORAL
  Filled 2021-01-24: qty 1

## 2021-01-24 MED ORDER — BUPROPION HCL ER (SR) 100 MG PO TB12
100.0000 mg | ORAL_TABLET | Freq: Every morning | ORAL | Status: DC
  Administered 2021-01-25: 100 mg via ORAL
  Filled 2021-01-24: qty 1

## 2021-01-24 MED ORDER — MENTHOL 3 MG MT LOZG: 1.0000 | LOZENGE | OROMUCOSAL | Status: DC | PRN

## 2021-01-24 MED ORDER — METOCLOPRAMIDE HCL 5 MG PO TABS: 5.0000 mg | ORAL_TABLET | Freq: Three times a day (TID) | ORAL | Status: DC | PRN

## 2021-01-24 MED ORDER — KETOROLAC TROMETHAMINE 30 MG/ML IJ SOLN
INTRAMUSCULAR | Status: DC | PRN
  Administered 2021-01-24: 30 mg

## 2021-01-24 MED ORDER — AMLODIPINE BESYLATE 5 MG PO TABS
5.0000 mg | ORAL_TABLET | Freq: Every morning | ORAL | Status: DC
  Administered 2021-01-25: 5 mg via ORAL
  Filled 2021-01-24: qty 1

## 2021-01-24 MED ORDER — PROPOFOL 1000 MG/100ML IV EMUL
INTRAVENOUS | Status: AC
  Filled 2021-01-24: qty 100

## 2021-01-24 MED ORDER — ACETAMINOPHEN 500 MG PO TABS
1000.0000 mg | ORAL_TABLET | Freq: Once | ORAL | Status: AC
  Administered 2021-01-24: 1000 mg via ORAL
  Filled 2021-01-24: qty 2

## 2021-01-24 MED ORDER — KETOROLAC TROMETHAMINE 30 MG/ML IJ SOLN
INTRAMUSCULAR | Status: AC
  Filled 2021-01-24: qty 1

## 2021-01-24 SURGICAL SUPPLY — 63 items
ATTUNE MED DOME PAT 38 KNEE (Knees) ×1 IMPLANT
ATTUNE MED DOME PAT 38MM KNEE (Knees) ×1 IMPLANT
ATTUNE PS FEM RT SZ 5 CEM KNEE (Femur) ×2 IMPLANT
BAG COUNTER SPONGE SURGICOUNT (BAG) IMPLANT
BAG SPEC THK2 15X12 ZIP CLS (MISCELLANEOUS)
BAG SPNG CNTER NS LX DISP (BAG)
BAG SURGICOUNT SPONGE COUNTING (BAG)
BAG ZIPLOCK 12X15 (MISCELLANEOUS) IMPLANT
BASEPLATE TIB CMT FB PCKT SZ5 (Knees) ×2 IMPLANT
BLADE SAG 18X100X1.27 (BLADE) ×3 IMPLANT
BLADE SAW SGTL 11.0X1.19X90.0M (BLADE) ×3 IMPLANT
BLADE SAW SGTL 13X75X1.27 (BLADE) ×3 IMPLANT
BLADE SURG 15 STRL LF DISP TIS (BLADE) ×1 IMPLANT
BLADE SURG 15 STRL SS (BLADE) ×3
BNDG CMPR MED 10X6 ELC LF (GAUZE/BANDAGES/DRESSINGS) ×1
BNDG ELASTIC 6X10 VLCR STRL LF (GAUZE/BANDAGES/DRESSINGS) ×3 IMPLANT
BOWL SMART MIX CTS (DISPOSABLE) ×3 IMPLANT
BSPLAT TIB 5 CMNT FXBRNG STRL (Knees) ×1 IMPLANT
CEMENT HV SMART SET (Cement) ×6 IMPLANT
CLOSURE STERI-STRIP 1/2X4 (GAUZE/BANDAGES/DRESSINGS) ×1
CLSR STERI-STRIP ANTIMIC 1/2X4 (GAUZE/BANDAGES/DRESSINGS) ×3 IMPLANT
COVER SURGICAL LIGHT HANDLE (MISCELLANEOUS) ×3 IMPLANT
CUFF TOURN SGL QUICK 34 (TOURNIQUET CUFF) ×3
CUFF TRNQT CYL 34X4.125X (TOURNIQUET CUFF) ×1 IMPLANT
DECANTER SPIKE VIAL GLASS SM (MISCELLANEOUS) IMPLANT
DRAPE SHEET LG 3/4 BI-LAMINATE (DRAPES) ×3 IMPLANT
DRAPE U-SHAPE 47X51 STRL (DRAPES) ×3 IMPLANT
DRSG MEPILEX BORDER 4X12 (GAUZE/BANDAGES/DRESSINGS) ×3 IMPLANT
DRSG PAD ABDOMINAL 8X10 ST (GAUZE/BANDAGES/DRESSINGS) ×6 IMPLANT
DURAPREP 26ML APPLICATOR (WOUND CARE) ×6 IMPLANT
ELECT REM PT RETURN 15FT ADLT (MISCELLANEOUS) ×3 IMPLANT
GLOVE SRG 8 PF TXTR STRL LF DI (GLOVE) ×1 IMPLANT
GLOVE SURG ENC MOIS LTX SZ6.5 (GLOVE) ×3 IMPLANT
GLOVE SURG ENC MOIS LTX SZ7.5 (GLOVE) ×3 IMPLANT
GLOVE SURG UNDER POLY LF SZ7 (GLOVE) ×3 IMPLANT
GLOVE SURG UNDER POLY LF SZ8 (GLOVE) ×3
GOWN STRL REUS W/ TWL LRG LVL3 (GOWN DISPOSABLE) ×2 IMPLANT
GOWN STRL REUS W/TWL LRG LVL3 (GOWN DISPOSABLE) ×6
HANDPIECE INTERPULSE COAX TIP (DISPOSABLE) ×3
HOLDER FOLEY CATH W/STRAP (MISCELLANEOUS) IMPLANT
HOOD PEEL AWAY FLYTE STAYCOOL (MISCELLANEOUS) ×9 IMPLANT
IMMOBILIZER KNEE 20 (SOFTGOODS) ×3
IMMOBILIZER KNEE 20 THIGH 36 (SOFTGOODS) ×1 IMPLANT
INSERT TIB ATTUNE FB SZ5X8 (Insert) ×2 IMPLANT
KIT TURNOVER KIT A (KITS) ×3 IMPLANT
MANIFOLD NEPTUNE II (INSTRUMENTS) ×3 IMPLANT
NS IRRIG 1000ML POUR BTL (IV SOLUTION) ×3 IMPLANT
PACK ICE MAXI GEL EZY WRAP (MISCELLANEOUS) ×3 IMPLANT
PACK TOTAL KNEE CUSTOM (KITS) ×3 IMPLANT
PENCIL SMOKE EVACUATOR (MISCELLANEOUS) ×3 IMPLANT
PIN DRILL FIX HALF THREAD (BIT) ×2 IMPLANT
PIN STEINMAN FIXATION KNEE (PIN) ×2 IMPLANT
PROTECTOR NERVE ULNAR (MISCELLANEOUS) ×3 IMPLANT
SET HNDPC FAN SPRY TIP SCT (DISPOSABLE) ×1 IMPLANT
SET PAD KNEE POSITIONER (MISCELLANEOUS) ×3 IMPLANT
SUT VIC AB 1 CT1 36 (SUTURE) ×6 IMPLANT
SUT VIC AB 2-0 CT1 27 (SUTURE) ×6
SUT VIC AB 2-0 CT1 TAPERPNT 27 (SUTURE) ×2 IMPLANT
SUT VIC AB 3-0 SH 8-18 (SUTURE) ×3 IMPLANT
TRAY FOLEY MTR SLVR 16FR STAT (SET/KITS/TRAYS/PACK) ×3 IMPLANT
TUBE SUCTION HIGH CAP CLEAR NV (SUCTIONS) ×3 IMPLANT
WATER STERILE IRR 1000ML POUR (IV SOLUTION) ×6 IMPLANT
WRAP KNEE MAXI GEL POST OP (GAUZE/BANDAGES/DRESSINGS) ×2 IMPLANT

## 2021-01-24 NOTE — Anesthesia Procedure Notes (Signed)
Spinal  Patient location during procedure: OR Start time: 01/24/2021 7:34 AM End time: 01/24/2021 7:41 AM Reason for block: surgical anesthesia Staffing Performed: anesthesiologist  Anesthesiologist: Audry Pili, MD Preanesthetic Checklist Completed: patient identified, IV checked, risks and benefits discussed, surgical consent, monitors and equipment checked, pre-op evaluation and timeout performed Spinal Block Patient position: sitting Prep: DuraPrep Patient monitoring: heart rate, cardiac monitor, continuous pulse ox and blood pressure Approach: midline Location: L2-3 Injection technique: single-shot Needle Needle type: Quincke  Needle gauge: 22 G Additional Notes Consent was obtained prior to the procedure with all questions answered and concerns addressed. Risks including, but not limited to, bleeding, infection, nerve damage, paralysis, failed block, inadequate analgesia, allergic reaction, high spinal, itching, and headache were discussed and the patient wished to proceed. Functioning IV was confirmed and monitors were applied. Sterile prep and drape, including hand hygiene, mask, and sterile gloves were used. The patient was positioned and the spine was prepped. The skin was anesthetized with lidocaine. Free flow of clear CSF was obtained prior to injecting local anesthetic into the CSF. The spinal needle aspirated freely following injection. The needle was carefully withdrawn. The patient tolerated the procedure well.   Renold Don, MD

## 2021-01-24 NOTE — H&P (Addendum)
PREOPERATIVE H&P  Chief Complaint: right knee pain  HPI: Christine Robles is a 65 y.o. female who presents for preoperative history and physical with a diagnosis of right knee oa. Symptoms are rated as moderate to severe, and have been worsening.  This is significantly impairing activities of daily living.  She has elected for surgical management.   She has failed injections, activity modification, anti-inflammatories, and assistive devices.  Preoperative X-rays demonstrate end stage degenerative changes with osteophyte formation, loss of joint space, subchondral sclerosis.   Past Medical History:  Diagnosis Date   Anxiety    Bipolar disorder ([place])    "vs ADHD" (09/26/2016)   Candida esophagitis (HCC)    Chronic lower back pain    Depression    Esophagitis    Fibromyalgia    Gastroparesis    Headache    "q 4 months now" (09/26/2016)   Hypertension    Hypothyroidism    IBS (irritable bowel syndrome)    Migraine    "maybe 1-2/year" (09/26/2016)   Occasional tremors    pt reports hx of hands shaking per pt that come and go   OSA on CPAP    Panic disorder    Pre-diabetes    Rheumatoid arthritis ([place])    "all over" (09/26/2016)   Spinal stenosis    Thyroid disease    hypothyroidism   Past Surgical History:  Procedure Laterality Date   ABDOMINAL HERNIA REPAIR  02/01/2011   [facility]   BREAST BIOPSY Left    15+ years ago no visual scars, 2x   CARDIAC CATHETERIZATION  2012   DILATION AND CURETTAGE OF UTERUS     KNEE ARTHROSCOPY Left 01/24/2006   hx/notes 10/17/2010   LAPAROSCOPIC CHOLECYSTECTOMY  05/2001   hx/notes 10/17/2010   LIPOMA EXCISION Right 12/2001   thigh, hx/notes 10/17/2010   LUMBAR DISC SURGERY  1990s   POSTERIOR LUMBAR FUSION  12/2008   "L4, L5, S1"   TONSILLECTOMY     TOTAL KNEE ARTHROPLASTY Left 09/26/2016   Procedure: LEFT TOTAL KNEE ARTHROPLASTY;  Surgeon: Ninetta Lights, MD;  Location: [facility];  Service: Orthopedics;  Laterality: Left;   TUBAL LIGATION      Social History   Socioeconomic History   Marital status: Widowed    Spouse name: Not on file   Number of children: 1   Years of education: Not on file   Highest education level: Some college, no degree  Occupational History   Occupation: diabled  Tobacco Use   Smoking status: Former    Packs/day: 1.00    Years: 15.00    Pack years: 15.00    Types: Cigarettes    Quit date: 08/08/1998    Years since quitting: 22.4   Smokeless tobacco: Never  Vaping Use   Vaping Use: Never used  Substance and Sexual Activity   Alcohol use: Not Currently   Drug use: Never   Sexual activity: Not Currently  Other Topics Concern   Not on file  Social History Narrative   Patient is right-handed. She lives alone in a one level home. She drinks 1/2 cup of coffee a day. She exercises occasionally on a stationary bike at home.   Social Determinants of Health   Financial Resource Strain: Not on file  Food Insecurity: Not on file  Transportation Needs: Not on file  Physical Activity: Not on file  Stress: Not on file  Social Connections: Not on file   Family History  Problem Relation Age of Onset  Leukemia Mother 24   Hypertension Father    Heart disease Father    Cirrhosis Father    Crohn's disease Father    Heart disease Brother        pacemaker   Peripheral Artery Disease Brother    Hypertension Brother    Heart disease Maternal Grandmother    Diabetes Brother    Diabetes Maternal Grandfather    Colon cancer Neg Hx    Allergies  Allergen Reactions   Escitalopram Oxalate Other (See Comments)    Stroke like symptoms. Pt thinks she may have been given too big a dose.   Buspirone Hcl Other (See Comments)    Abnormal behavior   Fentanyl Other (See Comments)    Headache    Morphine And Related Other (See Comments)    headache   Prior to Admission medications   Medication Sig Start Date End Date Taking? Authorizing Provider  amLODipine (NORVASC) 5 MG tablet Take 5 mg by mouth in  the morning. 09/05/20  Yes [provider]  atorvastatin (LIPITOR) 20 MG tablet Take 20 mg by mouth daily.   Yes [provider]  buPROPion ER (WELLBUTRIN SR) 100 MG 12 hr tablet Take 100 mg by mouth in the morning.   Yes [provider]  Cholecalciferol (VITAMIN D3) 50000 units CAPS Take 50,000 Units by mouth every Thursday. 02/11/18  Yes [provider]  diclofenac Sodium (VOLTAREN) 1 % GEL Apply 1 application topically 4 (four) times daily as needed (pain.).   Yes [provider]  FLUoxetine (PROZAC) 20 MG capsule Take 20 mg by mouth daily with breakfast.   Yes [provider]  gabapentin (NEURONTIN) 400 MG capsule Take 1,200 mg by mouth at bedtime. 12/05/20  Yes [provider]  lamoTRIgine (LAMICTAL) 200 MG tablet Take 200 mg by mouth in the morning. 12/09/17  Yes [provider]  Levomefolate Glucosamine (METHYLFOLATE PO) Take 15 mg by mouth in the morning.   Yes [provider]  levothyroxine (SYNTHROID, LEVOTHROID) 112 MCG tablet Take 112 mcg by mouth daily before breakfast. 02/25/18  Yes [provider]  lithium carbonate (ESKALITH) 450 MG CR tablet Take 450 mg by mouth at bedtime.   Yes [provider]  Melatonin 10 MG TABS Take 10 mg by mouth at bedtime.   Yes [provider]  meloxicam (MOBIC) 15 MG tablet Take 15 mg by mouth every morning. 12/05/20  Yes [provider]  metoCLOPramide (REGLAN) 10 MG tablet Take 1 tablet (10 mg total) by mouth every 8 (eight) hours as needed for nausea. 03/25/18  Yes Rozier, Richard T, MD  OLANZapine (ZYPREXA) 10 MG tablet Take 10 mg by mouth at bedtime.   Yes [provider]  Oxycodone HCl 10 MG TABS Take 10 mg by mouth every 4 (four) hours. 08/07/16  Yes [provider]  pantoprazole (PROTONIX) 40 MG tablet TAKE 1 TABLET BY MOUTH TWICE DAILY Patient taking differently: Take 40 mg by mouth daily. 09/20/14  Yes Lafayette Dragon, MD   Polyethyl Glycol-Propyl Glycol 0.4-0.3 % SOLN Place 1-2 drops into both eyes 3 (three) times daily as needed (dry/irritated eyes).   Yes [provider]  potassium chloride SA (KLOR-CON) 20 MEQ tablet Take 1 tablet (20 mEq total) by mouth 2 (two) times daily. 01/12/21  Yes Sherrill Raring, PA-C  RELISTOR 150 MG TABS Take 150 mg by mouth in the morning, at noon, and at bedtime. 03/16/18  Yes [provider]  Semaglutide,0.25 or  0.'5MG'$ /DOS, (OZEMPIC, 0.25 OR 0.5 MG/DOSE,) 2 MG/1.5ML SOPN Inject 0.25 mg into the skin every Friday. 10/27/20  Yes [provider]  SIMPONI 50 MG/0.5ML SOAJ Inject 50 mg into the skin every 30 (thirty) days. 12/25/20  Yes [provider]  tiZANidine (ZANAFLEX) 2 MG tablet Take 2 mg by mouth 3 (three) times daily as needed for muscle spasms.   Yes [provider]  triamterene-hydrochlorothiazide (MAXZIDE-25) 37.5-25 MG per tablet Take 1 each (1 tablet total) by mouth daily. 08/14/12  Yes Patrecia Pour, NP  ALPRAZolam Duanne Moron) 1 MG tablet Take 0.5-1 mg by mouth 2 (two) times daily as needed for anxiety.    [provider]     Positive ROS: All other systems have been reviewed and were otherwise negative with the exception of those mentioned in the HPI and as above.  Physical Exam: General: Alert, no acute distress Cardiovascular: No pedal edema Respiratory: No cyanosis, no use of accessory musculature GI: No organomegaly, abdomen is soft and non-tender Skin: No lesions in the area of chief complaint Neurologic: Sensation intact distally Psychiatric: Patient is competent for consent with normal mood and affect Lymphatic: No axillary or cervical lymphadenopathy  MUSCULOSKELETAL: right knee painful arc of motion with crepitance  Assessment: Right knee oa   Plan: Plan for Procedure(s): TOTAL KNEE ARTHROPLASTY  The risks benefits and alternatives were discussed with the patient including but not limited to the risks of  nonoperative treatment, versus surgical intervention including infection, bleeding, nerve injury,  blood clots, cardiopulmonary complications, morbidity, mortality, among others, and they were willing to proceed.    Patient's anticipated LOS is less than 2 midnights, meeting these requirements: - Younger than 59 - Lives within 1 hour of care - Has a competent adult at home to recover with post-op recover - NO history of  - Chronic pain requiring opiods  - Diabetes  - Coronary Artery Disease  - Heart failure  - Heart attack  - Stroke  - DVT/VTE  - Cardiac arrhythmia  - Respiratory Failure/COPD  - Renal failure  - Anemia  - Advanced Liver disease      Johnny Bridge, MD Cell [phone]   01/24/2021 7:24 AM

## 2021-01-24 NOTE — Discharge Instructions (Signed)
INSTRUCTIONS AFTER JOINT REPLACEMENT   Remove items at home which could result in a fall. This includes throw rugs or furniture in walking pathways ICE to the affected joint every three hours while awake for 30 minutes at a time, for at least the first 3-5 days, and then as needed for pain and swelling.  Continue to use ice for pain and swelling. You may notice swelling that will progress down to the foot and ankle.  This is normal after surgery.  Elevate your leg when you are not up walking on it.   Continue to use the breathing machine you got in the hospital (incentive spirometer) which will help keep your temperature down.  It is common for your temperature to cycle up and down following surgery, especially at night when you are not up moving around and exerting yourself.  The breathing machine keeps your lungs expanded and your temperature down.   DIET:  As you were doing prior to hospitalization, we recommend a well-balanced diet.  DRESSING / WOUND CARE / SHOWERING  You may shower 3 days after surgery, but keep the wounds dry during showering.  You may use an occlusive plastic wrap (Press'n Seal for example), NO SOAKING/SUBMERGING IN THE BATHTUB.  If the bandage gets wet, change with a clean dry gauze.  If the incision gets wet, pat the wound dry with a clean towel.  ACTIVITY  Increase activity slowly as tolerated, but follow the weight bearing instructions below.   No driving for 6 weeks or until further direction given by your physician.  You cannot drive while taking narcotics.  No lifting or carrying greater than 10 lbs. until further directed by your surgeon. Avoid periods of inactivity such as sitting longer than an hour when not asleep. This helps prevent blood clots.  You may return to work once you are authorized by your doctor.     WEIGHT BEARING   Weight bearing as tolerated with assist device (walker, cane, etc) as directed, use it as long as suggested by your surgeon or  therapist, typically at least 4-6 weeks.   EXERCISES  Results after joint replacement surgery are often greatly improved when you follow the exercise, range of motion and muscle strengthening exercises prescribed by your doctor. Safety measures are also important to protect the joint from further injury. Any time any of these exercises cause you to have increased pain or swelling, decrease what you are doing until you are comfortable again and then slowly increase them. If you have problems or questions, call your caregiver or physical therapist for advice.   Rehabilitation is important following a joint replacement. After just a few days of immobilization, the muscles of the leg can become weakened and shrink (atrophy).  These exercises are designed to build up the tone and strength of the thigh and leg muscles and to improve motion. Often times heat used for twenty to thirty minutes before working out will loosen up your tissues and help with improving the range of motion but do not use heat for the first two weeks following surgery (sometimes heat can increase post-operative swelling).   These exercises can be done on a training (exercise) mat, on the floor, on a table or on a bed. Use whatever works the best and is most comfortable for you.    Use music or television while you are exercising so that the exercises are a pleasant break in your day. This will make your life better with the exercises acting   as a break in your routine that you can look forward to.   Perform all exercises about fifteen times, three times per day or as directed.  You should exercise both the operative leg and the other leg as well.  Exercises include:   Quad Sets - Tighten up the muscle on the front of the thigh (Quad) and hold for 5-10 seconds.   Straight Leg Raises - With your knee straight (if you were given a brace, keep it on), lift the leg to 60 degrees, hold for 3 seconds, and slowly lower the leg.  Perform this  exercise against resistance later as your leg gets stronger.  Leg Slides: Lying on your back, slowly slide your foot toward your buttocks, bending your knee up off the floor (only go as far as is comfortable). Then slowly slide your foot back down until your leg is flat on the floor again.  Angel Wings: Lying on your back spread your legs to the side as far apart as you can without causing discomfort.  Hamstring Strength:  Lying on your back, push your heel against the floor with your leg straight by tightening up the muscles of your buttocks.  Repeat, but this time bend your knee to a comfortable angle, and push your heel against the floor.  You may put a pillow under the heel to make it more comfortable if necessary.   A rehabilitation program following joint replacement surgery can speed recovery and prevent re-injury in the future due to weakened muscles. Contact your doctor or a physical therapist for more information on knee rehabilitation.    CONSTIPATION  Constipation is defined medically as fewer than three stools per week and severe constipation as less than one stool per week.  Even if you have a regular bowel pattern at home, your normal regimen is likely to be disrupted due to multiple reasons following surgery.  Combination of anesthesia, postoperative narcotics, change in appetite and fluid intake all can affect your bowels.   YOU MUST use at least one of the following options; they are listed in order of increasing strength to get the job done.  They are all available over the counter, and you may need to use some, POSSIBLY even all of these options:    Drink plenty of fluids (prune juice may be helpful) and high fiber foods Colace 100 mg by mouth twice a day  Senokot for constipation as directed and as needed Dulcolax (bisacodyl), take with full glass of water  Miralax (polyethylene glycol) once or twice a day as needed.  If you have tried all these things and are unable to have a  bowel movement in the first 3-4 days after surgery call either your surgeon or your primary doctor.    If you experience loose stools or diarrhea, hold the medications until you stool forms back up.  If your symptoms do not get better within 1 week or if they get worse, check with your doctor.  If you experience "the worst abdominal pain ever" or develop nausea or vomiting, please contact the office immediately for further recommendations for treatment.   ITCHING:  If you experience itching with your medications, try taking only a single pain pill, or even half a pain pill at a time.  You can also use Benadryl over the counter for itching or also to help with sleep.   TED HOSE STOCKINGS:  Use stockings on both legs until for at least 2 weeks or as directed by  physician office. They may be removed at night for sleeping.  MEDICATIONS:  See your medication summary on the "After Visit Summary" that nursing will review with you.  You may have some home medications which will be placed on hold until you complete the course of blood thinner medication.  It is important for you to complete the blood thinner medication as prescribed.  PRECAUTIONS:  If you experience chest pain or shortness of breath - call 911 immediately for transfer to the hospital emergency department.   If you develop a fever greater that 101 F, purulent drainage from wound, increased redness or drainage from wound, foul odor from the wound/dressing, or calf pain - CONTACT YOUR SURGEON.                                                   FOLLOW-UP APPOINTMENTS:  If you do not already have a post-op appointment, please call the office for an appointment to be seen by your surgeon.  Guidelines for how soon to be seen are listed in your "After Visit Summary", but are typically between 1-4 weeks after surgery.  OTHER INSTRUCTIONS:    POST-OPERATIVE OPIOID TAPER INSTRUCTIONS: It is important to wean off of your opioid medication as soon as  possible. If you do not need pain medication after your surgery it is ok to stop day one. Opioids include: Codeine, Hydrocodone(Norco, Vicodin), Oxycodone(Percocet, oxycontin) and hydromorphone amongst others.  Long term and even short term use of opiods can cause: Increased pain response Dependence Constipation Depression Respiratory depression And more.  Withdrawal symptoms can include Flu like symptoms Nausea, vomiting And more Techniques to manage these symptoms Hydrate well Eat regular healthy meals Stay active Use relaxation techniques(deep breathing, meditating, yoga) Do Not substitute Alcohol to help with tapering If you have been on opioids for less than two weeks and do not have pain than it is ok to stop all together.  Plan to wean off of opioids This plan should start within one week post op of your joint replacement. Maintain the same interval or time between taking each dose and first decrease the dose.  Cut the total daily intake of opioids by one tablet each day Next start to increase the time between doses. The last dose that should be eliminated is the evening dose.   MAKE SURE YOU:  Understand these instructions.  Get help right away if you are not doing well or get worse.    Thank you for letting us be a part of your medical care team.  It is a privilege we respect greatly.  We hope these instructions will help you stay on track for a fast and full recovery!

## 2021-01-24 NOTE — Transfer of Care (Signed)
Immediate Anesthesia Transfer of Care Note  Patient: Christine Robles  Procedure(s) Performed: TOTAL KNEE ARTHROPLASTY (Right: Knee)  Patient Location: PACU  Anesthesia Type:Spinal and MAC combined with regional for post-op pain  Level of Consciousness: awake, alert , oriented and patient cooperative  Airway & Oxygen Therapy: Patient Spontanous Breathing and Patient connected to face mask oxygen  Post-op Assessment: Report given to RN and Post -op Vital signs reviewed and stable  Post vital signs: Reviewed and stable  Last Vitals:  Vitals Value Taken Time  BP 132/84 01/24/21 1008  Temp    Pulse 78 01/24/21 1011  Resp 15 01/24/21 1011  SpO2 99 % 01/24/21 1011  Vitals shown include unvalidated device data.  Last Pain:  Vitals:   01/24/21 0625  TempSrc: Oral  PainSc:          Complications: No notable events documented.

## 2021-01-24 NOTE — Anesthesia Postprocedure Evaluation (Signed)
Anesthesia Post Note  Patient: Christine Robles  Procedure(s) Performed: TOTAL KNEE ARTHROPLASTY (Right: Knee)     Patient location during evaluation: PACU Anesthesia Type: Spinal Level of consciousness: awake and alert Pain management: pain level controlled Vital Signs Assessment: post-procedure vital signs reviewed and stable Respiratory status: spontaneous breathing and respiratory function stable Cardiovascular status: blood pressure returned to baseline and stable Postop Assessment: spinal receding and no apparent nausea or vomiting Anesthetic complications: no   No notable events documented.  Last Vitals:  Vitals:   01/24/21 1100 01/24/21 1118  BP: 136/84 (!) 156/92  Pulse: 72 70  Resp: 11 16  Temp: (!) 36.4 C 36.5 C  SpO2: 100% 100%    Last Pain:  Vitals:   01/24/21 1118  TempSrc: Oral  PainSc:                  Audry Pili

## 2021-01-24 NOTE — Evaluation (Signed)
Physical Therapy Evaluation Patient Details Name: Christine Robles MRN: [id number] DOB: 09-27-1955 Today's Date: 01/24/2021   History of Present Illness  65 y.o. female admitted 01/24/21 for R TKA. PMH includes L TKA 2018, lumbar fusion 2010, spinal stenosis, HTN, OSA, RA, fibromyalgia, bipolar.  Clinical Impression  Pt is s/p TKA resulting in the deficits listed below (see PT Problem List). Pt ambulated 5' with RW, distance limited by pain/fatigue. Initiated TKA HEP.  Pt will benefit from skilled PT to increase their independence and safety with mobility to allow discharge to the venue listed below.      Follow Up Recommendations Follow surgeon's recommendation for DC plan and follow-up therapies;Supervision for mobility/OOB    Equipment Recommendations  None recommended by PT    Recommendations for Other Services       Precautions / Restrictions Precautions Precautions: Knee Precaution Booklet Issued: Yes (comment) Precaution Comments: pt reports 5 falls in the past 1 year; reviewed no pillow under R knee Restrictions Weight Bearing Restrictions: No      Mobility  Bed Mobility Overal bed mobility: Needs Assistance Bed Mobility: Supine to Sit     Supine to sit: Min assist;HOB elevated     General bed mobility comments: min A to support RLE, used rail    Transfers Overall transfer level: Needs assistance Equipment used: Rolling walker (2 wheeled) Transfers: Sit to/from Stand Sit to Stand: Mod assist;From elevated surface         General transfer comment: assist to power up  Ambulation/Gait Ambulation/Gait assistance: Min guard;Min assist Gait Distance (Feet): 5 Feet Assistive device: Rolling walker (2 wheeled) Gait Pattern/deviations: Step-to pattern;Decreased stride length     General Gait Details: VCs for sequencing  Stairs            Wheelchair Mobility    Modified Rankin (Stroke Patients Only)       Balance Overall balance  assessment: Modified Independent                                           Pertinent Vitals/Pain Pain Assessment: 0-10 Pain Score: 6  Pain Location: R knee Pain Descriptors / Indicators: Sore;Restless;Operative site guarding Pain Intervention(s): Limited activity within patient's tolerance;Monitored during session;Premedicated before session;Ice applied;Repositioned    Home Living Family/patient expects to be discharged to:: Private residence Living Arrangements: Alone Available Help at Discharge: Friend(s);Available 24 hours/day Type of Home: House Home Access: Stairs to enter Entrance Stairs-Rails: Right Entrance Stairs-Number of Steps: 3 Home Layout: One level Home Equipment: Walker - 2 wheels;Cane - single point;Walker - 4 wheels;Toilet riser      Prior Function Level of Independence: Independent with assistive device(s)         Comments: walks with RW in home, cane going out     Hand Dominance        Extremity/Trunk Assessment   Upper Extremity Assessment Upper Extremity Assessment: Overall WFL for tasks assessed    Lower Extremity Assessment Lower Extremity Assessment: RLE deficits/detail RLE Deficits / Details: SLR -3/5, knee AAROM 10-45* limited by pain RLE Sensation: WNL RLE Coordination: WNL    Cervical / Trunk Assessment Cervical / Trunk Assessment: Normal  Communication   Communication: No difficulties  Cognition Arousal/Alertness: Awake/alert Behavior During Therapy: WFL for tasks assessed/performed Overall Cognitive Status: Within Functional Limits for tasks assessed  General Comments      Exercises Total Joint Exercises Ankle Circles/Pumps: AROM;Both;10 reps;Supine Heel Slides: AAROM;Right;10 reps;Supine Straight Leg Raises: AAROM;Right;5 reps;Supine Goniometric ROM: 10-45* AAROM   Assessment/Plan    PT Assessment Patient needs continued PT services  PT  Problem List Decreased strength;Decreased mobility;Decreased range of motion;Decreased activity tolerance;Decreased balance;Pain;Obesity       PT Treatment Interventions DME instruction;Gait training;Stair training;Functional mobility training;Therapeutic exercise;Patient/family education;Therapeutic activities    PT Goals (Current goals can be found in the Care Plan section)  Acute Rehab PT Goals Patient Stated Goal: quilting PT Goal Formulation: With patient Time For Goal Achievement: 01/31/21 Potential to Achieve Goals: Good    Frequency 7X/week   Barriers to discharge        Co-evaluation               AM-PAC PT "6 Clicks" Mobility  Outcome Measure Help needed turning from your back to your side while in a flat bed without using bedrails?: A Little Help needed moving from lying on your back to sitting on the side of a flat bed without using bedrails?: A Lot Help needed moving to and from a bed to a chair (including a wheelchair)?: A Little Help needed standing up from a chair using your arms (e.g., wheelchair or bedside chair)?: A Lot Help needed to walk in hospital room?: A Lot Help needed climbing 3-5 steps with a railing? : Total 6 Click Score: 13    End of Session Equipment Utilized During Treatment: Gait belt Activity Tolerance: Patient limited by pain Patient left: in chair;with call bell/phone within reach;with family/visitor present;with chair alarm set Nurse Communication: Mobility status PT Visit Diagnosis: Difficulty in walking, not elsewhere classified (R26.2);Pain;Muscle weakness (generalized) (M62.81) Pain - Right/Left: Right Pain - part of body: Knee    Time: [id number] PT Time Calculation (min) (ACUTE ONLY): 31 min   Charges:   PT Evaluation $PT Eval Moderate Complexity: 1 Mod PT Treatments $Gait Training: 8-22 mins       Blondell Reveal Kistler PT 01/24/2021  Acute Rehabilitation Services Pager [phone] Office  [phone]

## 2021-01-24 NOTE — Anesthesia Procedure Notes (Signed)
Anesthesia Regional Block: Adductor canal block   Pre-Anesthetic Checklist: , timeout performed,  Correct Patient, Correct Site, Correct Laterality,  Correct Procedure, Correct Position, site marked,  Risks and benefits discussed,  Surgical consent,  Pre-op evaluation,  At surgeon's request and post-op pain management  Laterality: Right  Prep: chloraprep       Needles:  Injection technique: Single-shot  Needle Type: Echogenic Needle     Needle Length: 10cm  Needle Gauge: 21     Additional Needles:   Narrative:  Start time: 01/24/2021 7:10 AM End time: 01/24/2021 7:13 AM Injection made incrementally with aspirations every 5 mL.  Performed by: Personally  Anesthesiologist: Audry Pili, MD  Additional Notes: No pain on injection. No increased resistance to injection. Injection made in 5cc increments. Good needle visualization. Patient tolerated the procedure well.

## 2021-01-24 NOTE — Op Note (Signed)
DATE OF SURGERY:  01/24/2021 TIME: 9:32 AM  PATIENT NAME:  Christine Robles   AGE: 65 y.o.    PRE-OPERATIVE DIAGNOSIS: Right knee primary localized osteoarthritis  POST-OPERATIVE DIAGNOSIS:  Same  PROCEDURE: Right total Knee Arthroplasty  SURGEON:  Johnny Bridge, MD   ASSISTANT:  Merlene Pulling, PA-C, present and scrubbed throughout the case, critical for assistance with exposure, retraction, instrumentation, and closure.   OPERATIVE IMPLANTS: Depuy Attune size 5 posterior Stabilized Femur, with a size 5 fixed Bearing Tibia, 8 polyethylene insert with a 38 medialized oval dome polyethylene patella.  PREOPERATIVE INDICATIONS:  Christine Robles is a 65 y.o. year old female with end stage bone on bone degenerative arthritis of the knee who failed conservative treatment, including injections, antiinflammatories, activity modification, and assistive devices, and had significant impairment of their activities of daily living, and elected for Total Knee Arthroplasty.   The risks, benefits, and alternatives were discussed at length including but not limited to the risks of infection, bleeding, nerve injury, stiffness, blood clots, the need for revision surgery, cardiopulmonary complications, among others, and they were willing to proceed.  OPERATIVE FINDINGS AND UNIQUE ASPECTS OF THE CASE: She had extensive advanced patellofemoral degenerative changes with thinning of the patella, and only measured 19 mm before the cut.  It was scalloped out.  The medial and lateral compartments had a little bit of degenerative changes as well, although the lateral tibia was scalloped out and had grade 4 changes.  ESTIMATED BLOOD LOSS: Approximately 70 minutes  OPERATIVE DESCRIPTION:  The patient was brought to the operative room and placed in a supine position.  Anesthesia was administered.  IV antibiotics were given.  The lower extremity was prepped and draped in the usual sterile fashion.  Time out  was performed.  The leg was elevated and exsanguinated and the tourniquet was inflated.  Anterior quadriceps tendon splitting approach was performed.  The patella was everted and osteophytes were removed.  The anterior horn of the medial and lateral meniscus was removed.   The patella was then measured, and cut with the saw.  The thickness before the cut was 19 and after the cut was 14.  A metal shield was used to protect the patella throughout the case.    The distal femur was opened with the drill and the intramedullary distal femoral cutting jig was utilized, set at 5 degrees resecting 9 mm off the distal femur.  Care was taken to protect the collateral ligaments.  Then the extramedullary tibial cutting jig was utilized making the appropriate cut using the anterior tibial crest as a reference building in appropriate posterior slope.  Care was taken during the cut to protect the medial and collateral ligaments.  The proximal tibia was removed along with the posterior horns of the menisci.  The PCL was sacrificed.  I referenced the lateral tibia with 2 mm.  The extensor gap was measured and found to have adequate resection, measuring to a size 8.  It did appear that I got through the popliteus tendon, so I released the tourniquet and confirmed appropriate hemostasis and that there was no major vessel injury.  The distal femoral sizing jig was applied, taking care to avoid notching.  This was set at 3 degrees of external rotation.  Then the 4-in-1 cutting jig was applied and the anterior and posterior femur was cut, along with the chamfer cuts.  All posterior osteophytes were removed.  The flexion gap was then measured and was symmetric  with the extension gap.  I completed the distal femoral preparation using the appropriate jig to prepare the box.  The proximal tibia sized and prepared accordingly with the reamer and the punch, and then all components were trialed with the poly insert.  The knee was  found to have excellent balance and full motion.    The above named components were then cemented into place and all excess cement was removed.  The real polyethylene implant was placed.  After the cement had cured I released the tourniquet and confirmed excellent hemostasis with no major posterior vessel injury.    The knee was easily taken through a range of motion and the patella tracked well and the knee irrigated copiously and the parapatellar and subcutaneous tissue closed with vicryl, and monocryl with steri strips for the skin.  The wounds were injected with marcaine, and dressed with sterile gauze and the patient was awakened and returned to the PACU in stable and satisfactory condition.  There were no complications.  Total tourniquet time was ~70 minutes.

## 2021-01-25 DIAGNOSIS — Z96652 Presence of left artificial knee joint: Secondary | ICD-10-CM | POA: Diagnosis not present

## 2021-01-25 DIAGNOSIS — R7303 Prediabetes: Secondary | ICD-10-CM | POA: Diagnosis not present

## 2021-01-25 DIAGNOSIS — Z79899 Other long term (current) drug therapy: Secondary | ICD-10-CM | POA: Diagnosis not present

## 2021-01-25 DIAGNOSIS — I10 Essential (primary) hypertension: Secondary | ICD-10-CM | POA: Diagnosis not present

## 2021-01-25 DIAGNOSIS — E039 Hypothyroidism, unspecified: Secondary | ICD-10-CM | POA: Diagnosis not present

## 2021-01-25 DIAGNOSIS — M1711 Unilateral primary osteoarthritis, right knee: Secondary | ICD-10-CM | POA: Diagnosis not present

## 2021-01-25 DIAGNOSIS — Z87891 Personal history of nicotine dependence: Secondary | ICD-10-CM | POA: Diagnosis not present

## 2021-01-25 LAB — BASIC METABOLIC PANEL
Anion gap: 6 (ref 5–15)
BUN: 11 mg/dL (ref 8–23)
CO2: 27 mmol/L (ref 22–32)
Calcium: 8.8 mg/dL — ABNORMAL LOW (ref 8.9–10.3)
Chloride: 103 mmol/L (ref 98–111)
Creatinine, Ser: 1.11 mg/dL — ABNORMAL HIGH (ref 0.44–1.00)
GFR, Estimated: 55 mL/min — ABNORMAL LOW (ref >60)
Glucose, Bld: 98 mg/dL (ref 70–99)
Potassium: 3.4 mmol/L — ABNORMAL LOW (ref 3.5–5.1)
Sodium: 136 mmol/L (ref 135–145)

## 2021-01-25 LAB — CBC
HCT: 35.3 % — ABNORMAL LOW (ref 36.0–46.0)
Hemoglobin: 11.3 g/dL — ABNORMAL LOW (ref 12.0–15.0)
MCH: 29.4 pg (ref 26.0–34.0)
MCHC: 32 g/dL (ref 30.0–36.0)
MCV: 91.9 fL (ref 80.0–100.0)
Platelets: 379 10*3/uL (ref 150–400)
RBC: 3.84 MIL/uL — ABNORMAL LOW (ref 3.87–5.11)
RDW: 13.5 % (ref 11.5–15.5)
WBC: 8.1 10*3/uL (ref 4.0–10.5)
nRBC: 0 % (ref 0.0–0.2)

## 2021-01-25 MED ORDER — HYDROMORPHONE HCL 2 MG PO TABS: 2.0000 mg | ORAL_TABLET | ORAL | 0 refills | Status: DC | PRN

## 2021-01-25 MED ORDER — ASPIRIN EC 325 MG PO TBEC: 325.0000 mg | DELAYED_RELEASE_TABLET | Freq: Two times a day (BID) | ORAL | 0 refills | Status: DC

## 2021-01-25 NOTE — Progress Notes (Signed)
Provided discharge education/instructions, all questions and concerns addressed, all due meds given, discharged home with belongings accompanied by family.

## 2021-01-25 NOTE — TOC Transition Note (Signed)
Transition of Care [facility]) - CM/SW Discharge Note   Patient Details  Name: Christine Robles MRN: [id number] Date of Birth: 1955/10/27  [facility] of [facility]) CM/SW Contact:  Lennart Pall, LCSW Phone Number: 01/25/2021, 9:16 AM   Clinical Narrative:    Met with and confirming she has all needed DME at home.  Plan for HHPT prearranged with [place].  No further TOC needs.   Final next level of care: [place] Barriers to Discharge: No Barriers Identified   Patient Goals and CMS Choice Patient states their goals for this hospitalization and ongoing recovery are:: return home      Discharge Placement                       Discharge Plan and Services                DME Arranged: N/A DME Agency: NA       HH Arranged: PT [place] Agency: [facility]        Social Determinants of Health (SDOH) Interventions     Readmission Risk Interventions No flowsheet data found.

## 2021-01-25 NOTE — Plan of Care (Signed)
  Problem: Activity: Goal: Ability to avoid complications of mobility impairment will improve Outcome: Progressing   Problem: Pain Management: Goal: Pain level will decrease with appropriate interventions Outcome: Progressing   Problem: Clinical Measurements: Goal: Postoperative complications will be avoided or minimized Outcome: Progressing   

## 2021-01-25 NOTE — Progress Notes (Signed)
Physical Therapy Treatment Patient Details Name: Christine Robles MRN: [id number] DOB: Jun 05, 1955 Today's Date: 01/25/2021    History of Present Illness 65 y.o. female admitted 01/24/21 for R TKA. PMH includes L TKA 2018, lumbar fusion 2010, spinal stenosis, HTN, OSA, RA, fibromyalgia, bipolar.    PT Comments    Pt is progressing well with mobility and is ready to DC home from PT standpoint. She ambulated 61' with RW, completed stair training, and demonstrates good understanding of HEP.     Follow Up Recommendations  Follow surgeon's recommendation for DC plan and follow-up therapies;Supervision for mobility/OOB;Home health PT     Equipment Recommendations  None recommended by PT    Recommendations for Other Services       Precautions / Restrictions Precautions Precautions: Knee Precaution Booklet Issued: Yes (comment) Precaution Comments: pt reports 5 falls in the past 1 year; reviewed no pillow under R knee Restrictions Weight Bearing Restrictions: No RLE Weight Bearing: Weight bearing as tolerated    Mobility  Bed Mobility Overal bed mobility: Modified Independent Bed Mobility: Supine to Sit     Supine to sit: Modified independent (Device/Increase time);HOB elevated     General bed mobility comments: used rail    Transfers Overall transfer level: Needs assistance Equipment used: Rolling walker (2 wheeled) Transfers: Sit to/from Stand Sit to Stand: Min guard         General transfer comment: VCs hand placement  Ambulation/Gait Ambulation/Gait assistance: Supervision Gait Distance (Feet): 75 Feet Assistive device: Rolling walker (2 wheeled) Gait Pattern/deviations: Step-to pattern;Decreased stride length Gait velocity: decr   General Gait Details: VCs for sequencing, no loss of balance   Stairs Stairs: Yes Stairs assistance: Supervision Stair Management: Two rails;Step to pattern;Forwards Number of Stairs: 3 General stair comments: VCs  sequencing, pt reports she has 2 rails and that she can reach both   Wheelchair Mobility    Modified Rankin (Stroke Patients Only)       Balance Overall balance assessment: Modified Independent                                          Cognition Arousal/Alertness: Awake/alert Behavior During Therapy: WFL for tasks assessed/performed Overall Cognitive Status: Within Functional Limits for tasks assessed                                        Exercises Total Joint Exercises Ankle Circles/Pumps: AROM;Both;10 reps;Supine Quad Sets: AROM;Right;5 reps;Supine Short Arc Quad: AROM;Right;5 reps;Supine Heel Slides: AAROM;Right;10 reps;Supine Hip ABduction/ADduction: AROM;Right;10 reps;Supine Straight Leg Raises: AAROM;Right;Supine;10 reps Long Arc Quad: AROM;Right;10 reps;Seated Knee Flexion: AAROM;Right;10 reps;Seated Goniometric ROM: 5-60* AAROM R knee    General Comments        Pertinent Vitals/Pain Pain Score: 6  Pain Location: R knee Pain Descriptors / Indicators: Sore;Operative site guarding Pain Intervention(s): Limited activity within patient's tolerance;Monitored during session;Premedicated before session;Ice applied    Home Living                      Prior Function            PT Goals (current goals can now be found in the care plan section) Acute Rehab PT Goals Patient Stated Goal: quilting PT Goal Formulation: With patient Time For Goal Achievement: 01/31/21 Potential to Achieve  Goals: Good Progress towards PT goals: Progressing toward goals    Frequency    7X/week      PT Plan Current plan remains appropriate    Co-evaluation              AM-PAC PT "6 Clicks" Mobility   Outcome Measure  Help needed turning from your back to your side while in a flat bed without using bedrails?: A Little Help needed moving from lying on your back to sitting on the side of a flat bed without using bedrails?: A  Little Help needed moving to and from a bed to a chair (including a wheelchair)?: None Help needed standing up from a chair using your arms (e.g., wheelchair or bedside chair)?: None Help needed to walk in hospital room?: None Help needed climbing 3-5 steps with a railing? : A Little 6 Click Score: 21    End of Session Equipment Utilized During Treatment: Gait belt Activity Tolerance: Patient tolerated treatment well Patient left: with call bell/phone within reach;in bed Nurse Communication: Mobility status PT Visit Diagnosis: Difficulty in walking, not elsewhere classified (R26.2);Pain;Muscle weakness (generalized) (M62.81) Pain - Right/Left: Right Pain - part of body: Knee     Time: [id number] PT Time Calculation (min) (ACUTE ONLY): 29 min  Charges:  $Gait Training: 8-22 mins $Therapeutic Exercise: 8-22 mins                    Blondell Reveal Kistler PT 01/25/2021  Acute Rehabilitation Services Pager [phone] Office [phone]

## 2021-01-25 NOTE — Progress Notes (Signed)
Physical Therapy Treatment Patient Details Name: Christine Robles MRN: [id number] DOB: February 28, 1956 Today's Date: 01/25/2021    History of Present Illness 65 y.o. female admitted 01/24/21 for R TKA. PMH includes L TKA 2018, lumbar fusion 2010, spinal stenosis, HTN, OSA, RA, fibromyalgia, bipolar.    PT Comments    Pt is progressing with mobility, she ambulated 60' with RW without loss of balance, no buckling of knee. Initiated TKA HEP. Will plan to do stair training this afternoon, then I expect she'll be ready to DC home.    Follow Up Recommendations  Follow surgeon's recommendation for DC plan and follow-up therapies;Supervision for mobility/OOB;Home health PT     Equipment Recommendations  None recommended by PT    Recommendations for Other Services       Precautions / Restrictions Precautions Precautions: Knee Precaution Booklet Issued: Yes (comment) Precaution Comments: pt reports 5 falls in the past 1 year; reviewed no pillow under R knee Restrictions Weight Bearing Restrictions: No RLE Weight Bearing: Weight bearing as tolerated    Mobility  Bed Mobility Overal bed mobility: Needs Assistance Bed Mobility: Supine to Sit     Supine to sit: Min assist     General bed mobility comments: min A to support RLE, used rail    Transfers Overall transfer level: Needs assistance Equipment used: Rolling walker (2 wheeled) Transfers: Sit to/from Stand Sit to Stand: Min assist         General transfer comment: assist to power up, VCs hand placement  Ambulation/Gait Ambulation/Gait assistance: Min guard Gait Distance (Feet): 60 Feet Assistive device: Rolling walker (2 wheeled) Gait Pattern/deviations: Step-to pattern;Decreased stride length Gait velocity: decr   General Gait Details: VCs for sequencing, no loss of balance   Stairs             Wheelchair Mobility    Modified Rankin (Stroke Patients Only)       Balance Overall balance assessment:  Modified Independent                                          Cognition Arousal/Alertness: Awake/alert Behavior During Therapy: WFL for tasks assessed/performed Overall Cognitive Status: Within Functional Limits for tasks assessed                                        Exercises Total Joint Exercises Ankle Circles/Pumps: AROM;Both;10 reps;Supine Quad Sets: AROM;Right;5 reps;Supine Short Arc Quad: AROM;Right;5 reps;Supine Heel Slides: AAROM;Right;10 reps;Supine Straight Leg Raises: AAROM;Right;Supine;10 reps    General Comments        Pertinent Vitals/Pain Pain Score: 8  Pain Location: R knee Pain Descriptors / Indicators: Sore;Operative site guarding Pain Intervention(s): Limited activity within patient's tolerance;Monitored during session;Premedicated before session;Ice applied    Home Living                      Prior Function            PT Goals (current goals can now be found in the care plan section) Acute Rehab PT Goals Patient Stated Goal: quilting PT Goal Formulation: With patient Time For Goal Achievement: 01/31/21 Potential to Achieve Goals: Good Progress towards PT goals: Progressing toward goals    Frequency    7X/week      PT Plan Current plan  remains appropriate    Co-evaluation              AM-PAC PT "6 Clicks" Mobility   Outcome Measure  Help needed turning from your back to your side while in a flat bed without using bedrails?: A Little Help needed moving from lying on your back to sitting on the side of a flat bed without using bedrails?: A Little Help needed moving to and from a bed to a chair (including a wheelchair)?: A Little Help needed standing up from a chair using your arms (e.g., wheelchair or bedside chair)?: A Little Help needed to walk in hospital room?: A Little Help needed climbing 3-5 steps with a railing? : A Lot 6 Click Score: 17    End of Session Equipment Utilized  During Treatment: Gait belt Activity Tolerance: Patient tolerated treatment well Patient left: in chair;with call bell/phone within reach;with chair alarm set Nurse Communication: Mobility status PT Visit Diagnosis: Difficulty in walking, not elsewhere classified (R26.2);Pain;Muscle weakness (generalized) (M62.81) Pain - Right/Left: Right Pain - part of body: Knee     Time: 1011-1036 PT Time Calculation (min) (ACUTE ONLY): 25 min  Charges:  $Gait Training: 8-22 mins $Therapeutic Exercise: 8-22 mins                     Blondell Reveal Kistler PT 01/25/2021  Acute Rehabilitation Services Pager [phone] Office [phone]

## 2021-01-25 NOTE — Discharge Summary (Signed)
Discharge Summary  Patient ID: Christine Robles MRN: [id number] DOB/AGE: [birth year] 67 y.o.  Admit date: 01/24/2021 Discharge date: 01/25/2021  Admission Diagnoses:  Osteoarthritis of right knee  Discharge Diagnoses:  Active Problems:   S/P TKR (total knee replacement), right   S/P total knee arthroplasty, right   Past Medical History:  Diagnosis Date   Anxiety    Bipolar disorder ([place])    "vs ADHD" (09/26/2016)   Candida esophagitis ([facility])    Chronic lower back pain    Depression    Esophagitis    Fibromyalgia    Gastroparesis    Headache    "q 4 months now" (09/26/2016)   Hypertension    Hypothyroidism    IBS (irritable bowel syndrome)    Migraine    "maybe 1-2/year" (09/26/2016)   Occasional tremors    pt reports hx of hands shaking per pt that come and go   OSA on CPAP    Panic disorder    Pre-diabetes    Rheumatoid arthritis ([place])    "all over" (09/26/2016)   Spinal stenosis    Thyroid disease    hypothyroidism    Surgeries: Procedure(s): TOTAL KNEE ARTHROPLASTY on 01/24/2021   Consultants (if any):   Discharged Condition: Improved  Hospital Course: Christine Robles is an 65 y.o. female who was admitted 01/24/2021 with a diagnosis of Osteoarthritis of right knee and went to the operating room on 01/24/2021 and underwent the above named procedures.    She was given perioperative antibiotics:  Anti-infectives (From admission, onward)    Start     Dose/Rate Route Frequency Ordered Stop   01/24/21 1300  ceFAZolin (ANCEF) IVPB 2g/100 mL premix        2 g 200 mL/hr over 30 Minutes Intravenous Every 6 hours 01/24/21 1115 01/24/21 2047   01/24/21 0600  ceFAZolin (ANCEF) IVPB 2g/100 mL premix        2 g 200 mL/hr over 30 Minutes Intravenous On call to O.R. 01/24/21 0532 01/24/21 0759     .  She was given sequential compression devices, early ambulation, and aspirin for DVT prophylaxis.  She benefited maximally from the hospital stay and there were no  complications.    Recent vital signs:  Vitals:   01/25/21 1238 01/25/21 1416  BP: 137/74 (!) 141/79  Pulse: 79 84  Resp: 16 16  Temp: 98.1 F (36.7 C) 98.3 F (36.8 C)  SpO2: 91% 93%    Recent laboratory studies:  Lab Results  Component Value Date   HGB 11.3 (L) 01/25/2021   HGB 12.2 01/24/2021   HGB 13.5 01/12/2021   Lab Results  Component Value Date   WBC 8.1 01/25/2021   PLT 379 01/25/2021   Lab Results  Component Value Date   INR 0.92 08/14/2010   Lab Results  Component Value Date   NA 136 01/25/2021   K 3.4 (L) 01/25/2021   CL 103 01/25/2021   CO2 27 01/25/2021   BUN 11 01/25/2021   CREATININE 1.11 (H) 01/25/2021   GLUCOSE 98 01/25/2021    Discharge Medications:   Allergies as of 01/25/2021       Reactions   Escitalopram Oxalate Other (See Comments)   Stroke like symptoms. Pt thinks she may have been given too big a dose.   Buspirone Hcl Other (See Comments)   Abnormal behavior   Fentanyl Other (See Comments)   Headache    Morphine And Related Other (See Comments)   headache  Medication List     STOP taking these medications    diclofenac Sodium 1 % Gel Commonly known as: VOLTAREN   meloxicam 15 MG tablet Commonly known as: MOBIC       TAKE these medications    ALPRAZolam 1 MG tablet Commonly known as: XANAX Take 0.5-1 mg by mouth 2 (two) times daily as needed for anxiety.   amLODipine 5 MG tablet Commonly known as: NORVASC Take 5 mg by mouth in the morning.   aspirin EC 325 MG tablet Take 1 tablet (325 mg total) by mouth 2 (two) times daily.   atorvastatin 20 MG tablet Commonly known as: LIPITOR Take 20 mg by mouth daily.   buPROPion ER 100 MG 12 hr tablet Commonly known as: WELLBUTRIN SR Take 100 mg by mouth in the morning.   FLUoxetine 20 MG capsule Commonly known as: PROZAC Take 20 mg by mouth daily with breakfast.   gabapentin 400 MG capsule Commonly known as: NEURONTIN Take 1,200 mg by mouth at  bedtime.   HYDROmorphone 2 MG tablet Commonly known as: Dilaudid Take 1 tablet (2 mg total) by mouth every 4 (four) hours as needed for severe pain. To use instead of oxycodone for breakthrough pain   lamoTRIgine 200 MG tablet Commonly known as: LAMICTAL Take 200 mg by mouth in the morning.   levothyroxine 112 MCG tablet Commonly known as: SYNTHROID Take 112 mcg by mouth daily before breakfast.   lithium carbonate 450 MG CR tablet Commonly known as: ESKALITH Take 450 mg by mouth at bedtime.   Melatonin 10 MG Tabs Take 10 mg by mouth at bedtime.   METHYLFOLATE PO Take 15 mg by mouth in the morning.   metoCLOPramide 10 MG tablet Commonly known as: REGLAN Take 1 tablet (10 mg total) by mouth every 8 (eight) hours as needed for nausea.   OLANZapine 10 MG tablet Commonly known as: ZYPREXA Take 10 mg by mouth at bedtime.   Oxycodone HCl 10 MG Tabs Take 10 mg by mouth every 4 (four) hours.   Ozempic (0.25 or 0.5 MG/DOSE) 2 MG/1.5ML Sopn Generic drug: Semaglutide(0.25 or 0.'5MG'$ /DOS) Inject 0.25 mg into the skin every Friday.   pantoprazole 40 MG tablet Commonly known as: PROTONIX TAKE 1 TABLET BY MOUTH TWICE DAILY What changed: when to take this   Polyethyl Glycol-Propyl Glycol 0.4-0.3 % Soln Place 1-2 drops into both eyes 3 (three) times daily as needed (dry/irritated eyes).   potassium chloride SA 20 MEQ tablet Commonly known as: KLOR-CON Take 1 tablet (20 mEq total) by mouth 2 (two) times daily.   Relistor 150 MG Tabs Generic drug: Methylnaltrexone Bromide Take 150 mg by mouth in the morning, at noon, and at bedtime.   Simponi 50 MG/0.5ML Soaj Generic drug: Golimumab Inject 50 mg into the skin every 30 (thirty) days.   tiZANidine 2 MG tablet Commonly known as: ZANAFLEX Take 2 mg by mouth 3 (three) times daily as needed for muscle spasms.   triamterene-hydrochlorothiazide 37.5-25 MG tablet Commonly known as: MAXZIDE-25 Take 1 each (1 tablet total) by mouth  daily.   Vitamin D3 1.25 MG (50000 UT) Caps Take 50,000 Units by mouth every Thursday.        Diagnostic Studies: DG Knee Right Port  Result Date: 01/24/2021 CLINICAL DATA:  Post right total knee replacement. EXAM: PORTABLE RIGHT KNEE - 1-2 VIEW COMPARISON:  None. FINDINGS: Post right total knee replacement. No fracture or dislocation. Prosthetic hardware appears in anatomic alignment. There is a minimal amount  of expected adjacent postoperative adjacent emphysema and intra-articular air. No radiopaque foreign body. IMPRESSION: Post uncomplicated right total knee replacement. Electronically Signed   By: Sandi Mariscal M.D.   On: 01/24/2021 11:05   PCV MYOCARDIAL PERFUSION WITH LEXISCAN  Result Date: 01/03/2021 Lexiscan Sestamibi stress test 01/02/2021: Lexiscan nuclear stress test performed using *-day protocol. SPECT images show decrease tracer uptake in inferior myocardium at both rest and stress. This is most likely due to breast attenuation, with imaging performed in sitting position. Low risk study.   Disposition: Discharge disposition: 06-Home-Health Care Svc         Follow-up Information     Marchia Bond, MD. Go on 02/03/2021.   Specialty: Orthopedic Surgery Why: Your appointment is scheduled for 11:30. Contact information: [address] [place] [address] [phone]         [facility] Follow up.   Specialty: Home Health Services Why: HHPT will provide 6 home visits prior to you starting Outpatient physical therapy Contact information: [address] [place] [address] [phone]         [facility], [place]. Go on 02/03/2021.   Why: Your therapy appointment has been scheduled for 12:00. Please go over to the therapy side after your appointment with Dr. Mardelle Matte to complete your paperwork Contact information: [facility] [place] [address] [phone]                   Signed: Jola Baptist 01/25/2021, 2:47 PM

## 2021-01-25 NOTE — Progress Notes (Signed)
Subjective: 1 Day Post-Op s/p Procedure(s): TOTAL KNEE ARTHROPLASTY   Patient is alert, oriented, yes  Patient reports pain as moderate.   Denies chest pain, SOB, Calf pain. No nausea/vomiting. No other complaints.    Objective:  PE: VITALS:   Vitals:   01/24/21 2046 01/24/21 2306 01/25/21 0100 01/25/21 0528  BP: (!) 139/92  116/74 (!) 150/96  Pulse: 67 75 76 79  Resp: '17 16 17 17  '$ Temp: 97.7 F (36.5 C)  98.3 F (36.8 C) 98.4 F (36.9 C)  TempSrc:      SpO2: 96% 94% 90% 96%  Weight:      Height:        ABD soft Sensation intact distally Intact pulses distally Dorsiflexion/Plantar flexion intact Incision: dressing C/D/I Compartment soft  LABS  Results for orders placed or performed during the hospital encounter of 01/24/21 (from the past 24 hour(s))  CBC     Status: Abnormal   Collection Time: 01/25/21  3:36 AM  Result Value Ref Range   WBC 8.1 4.0 - 10.5 K/uL   RBC 3.84 (L) 3.87 - 5.11 MIL/uL   Hemoglobin 11.3 (L) 12.0 - 15.0 g/dL   HCT 35.3 (L) 36.0 - 46.0 %   MCV 91.9 80.0 - 100.0 fL   MCH 29.4 26.0 - 34.0 pg   MCHC 32.0 30.0 - 36.0 g/dL   RDW 13.5 11.5 - 15.5 %   Platelets 379 150 - 400 K/uL   nRBC 0.0 0.0 - 0.2 %  Basic metabolic panel     Status: Abnormal   Collection Time: 01/25/21  3:36 AM  Result Value Ref Range   Sodium 136 135 - 145 mmol/L   Potassium 3.4 (L) 3.5 - 5.1 mmol/L   Chloride 103 98 - 111 mmol/L   CO2 27 22 - 32 mmol/L   Glucose, Bld 98 70 - 99 mg/dL   BUN 11 8 - 23 mg/dL   Creatinine, Ser 1.11 (H) 0.44 - 1.00 mg/dL   Calcium 8.8 (L) 8.9 - 10.3 mg/dL   GFR, Estimated 55 (L) >60 mL/min   Anion gap 6 5 - 15    DG Knee Right Port  Result Date: 01/24/2021 CLINICAL DATA:  Post right total knee replacement. EXAM: PORTABLE RIGHT KNEE - 1-2 VIEW COMPARISON:  None. FINDINGS: Post right total knee replacement. No fracture or dislocation. Prosthetic hardware appears in anatomic alignment. There is a minimal amount of expected  adjacent postoperative adjacent emphysema and intra-articular air. No radiopaque foreign body. IMPRESSION: Post uncomplicated right total knee replacement. Electronically Signed   By: Sandi Mariscal M.D.   On: 01/24/2021 11:05    Assessment/Plan: Active Problems:   S/P TKR (total knee replacement), right   S/P total knee arthroplasty, right  1 Day Post-Op s/p Procedure(s): TOTAL KNEE ARTHROPLASTY -overall doing well, was able to ambulate 5 feet yesterday to door with PT, limited by pain  Weightbearing: WBAT RLE Insicional and dressing care: Reinforce dressings as needed VTE prophylaxis: Aspirin '325mg'$  BID x 30 days Pain control: home oxycodone '10mg'$  q 4 hours, IV dilaudid on top for breakthrough pain, will discharge with home dilaudid for breakthrough pain Follow - up plan: 2 weeks with Dr. Mardelle Matte Dispo: pending progress with PT today, ideally discharge home this afternoon if able to pass stair training  Contact information:   Weekdays 8-5 Merlene Pulling, PA-C [phone] A fter hours and holidays please check [email or web address] for group call information for [facility]  01/25/2021, 7:35 AM

## 2021-01-25 NOTE — Plan of Care (Signed)

## 2021-01-26 ENCOUNTER — Encounter (HOSPITAL_COMMUNITY): Payer: Self-pay | Admitting: Orthopedic Surgery

## 2021-01-26 DIAGNOSIS — G43909 Migraine, unspecified, not intractable, without status migrainosus: Secondary | ICD-10-CM | POA: Diagnosis not present

## 2021-01-26 DIAGNOSIS — R7303 Prediabetes: Secondary | ICD-10-CM | POA: Diagnosis not present

## 2021-01-26 DIAGNOSIS — Z87891 Personal history of nicotine dependence: Secondary | ICD-10-CM | POA: Diagnosis not present

## 2021-01-26 DIAGNOSIS — M797 Fibromyalgia: Secondary | ICD-10-CM | POA: Diagnosis not present

## 2021-01-26 DIAGNOSIS — K3184 Gastroparesis: Secondary | ICD-10-CM | POA: Diagnosis not present

## 2021-01-26 DIAGNOSIS — M069 Rheumatoid arthritis, unspecified: Secondary | ICD-10-CM | POA: Diagnosis not present

## 2021-01-26 DIAGNOSIS — Z96651 Presence of right artificial knee joint: Secondary | ICD-10-CM | POA: Diagnosis not present

## 2021-01-26 DIAGNOSIS — E039 Hypothyroidism, unspecified: Secondary | ICD-10-CM | POA: Diagnosis not present

## 2021-01-26 DIAGNOSIS — G8929 Other chronic pain: Secondary | ICD-10-CM | POA: Diagnosis not present

## 2021-01-26 DIAGNOSIS — Z9181 History of falling: Secondary | ICD-10-CM | POA: Diagnosis not present

## 2021-01-26 DIAGNOSIS — I10 Essential (primary) hypertension: Secondary | ICD-10-CM | POA: Diagnosis not present

## 2021-01-26 DIAGNOSIS — M4800 Spinal stenosis, site unspecified: Secondary | ICD-10-CM | POA: Diagnosis not present

## 2021-01-26 DIAGNOSIS — Z791 Long term (current) use of non-steroidal anti-inflammatories (NSAID): Secondary | ICD-10-CM | POA: Diagnosis not present

## 2021-01-26 DIAGNOSIS — M545 Low back pain, unspecified: Secondary | ICD-10-CM | POA: Diagnosis not present

## 2021-01-26 DIAGNOSIS — K589 Irritable bowel syndrome without diarrhea: Secondary | ICD-10-CM | POA: Diagnosis not present

## 2021-01-26 DIAGNOSIS — Z471 Aftercare following joint replacement surgery: Secondary | ICD-10-CM | POA: Diagnosis not present

## 2021-01-26 DIAGNOSIS — G4733 Obstructive sleep apnea (adult) (pediatric): Secondary | ICD-10-CM | POA: Diagnosis not present

## 2021-01-28 DIAGNOSIS — E039 Hypothyroidism, unspecified: Secondary | ICD-10-CM | POA: Diagnosis not present

## 2021-01-28 DIAGNOSIS — M069 Rheumatoid arthritis, unspecified: Secondary | ICD-10-CM | POA: Diagnosis not present

## 2021-01-28 DIAGNOSIS — G4733 Obstructive sleep apnea (adult) (pediatric): Secondary | ICD-10-CM | POA: Diagnosis not present

## 2021-01-28 DIAGNOSIS — G8929 Other chronic pain: Secondary | ICD-10-CM | POA: Diagnosis not present

## 2021-01-28 DIAGNOSIS — M797 Fibromyalgia: Secondary | ICD-10-CM | POA: Diagnosis not present

## 2021-01-28 DIAGNOSIS — K589 Irritable bowel syndrome without diarrhea: Secondary | ICD-10-CM | POA: Diagnosis not present

## 2021-01-28 DIAGNOSIS — Z471 Aftercare following joint replacement surgery: Secondary | ICD-10-CM | POA: Diagnosis not present

## 2021-01-28 DIAGNOSIS — Z9181 History of falling: Secondary | ICD-10-CM | POA: Diagnosis not present

## 2021-01-28 DIAGNOSIS — M545 Low back pain, unspecified: Secondary | ICD-10-CM | POA: Diagnosis not present

## 2021-01-28 DIAGNOSIS — K3184 Gastroparesis: Secondary | ICD-10-CM | POA: Diagnosis not present

## 2021-01-28 DIAGNOSIS — I10 Essential (primary) hypertension: Secondary | ICD-10-CM | POA: Diagnosis not present

## 2021-01-28 DIAGNOSIS — R7303 Prediabetes: Secondary | ICD-10-CM | POA: Diagnosis not present

## 2021-01-28 DIAGNOSIS — Z791 Long term (current) use of non-steroidal anti-inflammatories (NSAID): Secondary | ICD-10-CM | POA: Diagnosis not present

## 2021-01-28 DIAGNOSIS — Z96651 Presence of right artificial knee joint: Secondary | ICD-10-CM | POA: Diagnosis not present

## 2021-01-28 DIAGNOSIS — G43909 Migraine, unspecified, not intractable, without status migrainosus: Secondary | ICD-10-CM | POA: Diagnosis not present

## 2021-01-28 DIAGNOSIS — Z87891 Personal history of nicotine dependence: Secondary | ICD-10-CM | POA: Diagnosis not present

## 2021-01-28 DIAGNOSIS — M4800 Spinal stenosis, site unspecified: Secondary | ICD-10-CM | POA: Diagnosis not present

## 2021-01-29 DIAGNOSIS — Z791 Long term (current) use of non-steroidal anti-inflammatories (NSAID): Secondary | ICD-10-CM | POA: Diagnosis not present

## 2021-01-29 DIAGNOSIS — K3184 Gastroparesis: Secondary | ICD-10-CM | POA: Diagnosis not present

## 2021-01-29 DIAGNOSIS — M4800 Spinal stenosis, site unspecified: Secondary | ICD-10-CM | POA: Diagnosis not present

## 2021-01-29 DIAGNOSIS — Z96651 Presence of right artificial knee joint: Secondary | ICD-10-CM | POA: Diagnosis not present

## 2021-01-29 DIAGNOSIS — K589 Irritable bowel syndrome without diarrhea: Secondary | ICD-10-CM | POA: Diagnosis not present

## 2021-01-29 DIAGNOSIS — M069 Rheumatoid arthritis, unspecified: Secondary | ICD-10-CM | POA: Diagnosis not present

## 2021-01-29 DIAGNOSIS — G4733 Obstructive sleep apnea (adult) (pediatric): Secondary | ICD-10-CM | POA: Diagnosis not present

## 2021-01-29 DIAGNOSIS — R7303 Prediabetes: Secondary | ICD-10-CM | POA: Diagnosis not present

## 2021-01-29 DIAGNOSIS — G43909 Migraine, unspecified, not intractable, without status migrainosus: Secondary | ICD-10-CM | POA: Diagnosis not present

## 2021-01-29 DIAGNOSIS — E039 Hypothyroidism, unspecified: Secondary | ICD-10-CM | POA: Diagnosis not present

## 2021-01-29 DIAGNOSIS — Z9181 History of falling: Secondary | ICD-10-CM | POA: Diagnosis not present

## 2021-01-29 DIAGNOSIS — I10 Essential (primary) hypertension: Secondary | ICD-10-CM | POA: Diagnosis not present

## 2021-01-29 DIAGNOSIS — Z87891 Personal history of nicotine dependence: Secondary | ICD-10-CM | POA: Diagnosis not present

## 2021-01-29 DIAGNOSIS — M545 Low back pain, unspecified: Secondary | ICD-10-CM | POA: Diagnosis not present

## 2021-01-29 DIAGNOSIS — Z471 Aftercare following joint replacement surgery: Secondary | ICD-10-CM | POA: Diagnosis not present

## 2021-01-29 DIAGNOSIS — M797 Fibromyalgia: Secondary | ICD-10-CM | POA: Diagnosis not present

## 2021-01-29 DIAGNOSIS — G8929 Other chronic pain: Secondary | ICD-10-CM | POA: Diagnosis not present

## 2021-01-30 DIAGNOSIS — M4800 Spinal stenosis, site unspecified: Secondary | ICD-10-CM | POA: Diagnosis not present

## 2021-01-30 DIAGNOSIS — M797 Fibromyalgia: Secondary | ICD-10-CM | POA: Diagnosis not present

## 2021-01-30 DIAGNOSIS — I10 Essential (primary) hypertension: Secondary | ICD-10-CM | POA: Diagnosis not present

## 2021-01-30 DIAGNOSIS — Z87891 Personal history of nicotine dependence: Secondary | ICD-10-CM | POA: Diagnosis not present

## 2021-01-30 DIAGNOSIS — E039 Hypothyroidism, unspecified: Secondary | ICD-10-CM | POA: Diagnosis not present

## 2021-01-30 DIAGNOSIS — Z791 Long term (current) use of non-steroidal anti-inflammatories (NSAID): Secondary | ICD-10-CM | POA: Diagnosis not present

## 2021-01-30 DIAGNOSIS — M069 Rheumatoid arthritis, unspecified: Secondary | ICD-10-CM | POA: Diagnosis not present

## 2021-01-30 DIAGNOSIS — Z471 Aftercare following joint replacement surgery: Secondary | ICD-10-CM | POA: Diagnosis not present

## 2021-01-30 DIAGNOSIS — G43909 Migraine, unspecified, not intractable, without status migrainosus: Secondary | ICD-10-CM | POA: Diagnosis not present

## 2021-01-30 DIAGNOSIS — K589 Irritable bowel syndrome without diarrhea: Secondary | ICD-10-CM | POA: Diagnosis not present

## 2021-01-30 DIAGNOSIS — Z9181 History of falling: Secondary | ICD-10-CM | POA: Diagnosis not present

## 2021-01-30 DIAGNOSIS — Z96651 Presence of right artificial knee joint: Secondary | ICD-10-CM | POA: Diagnosis not present

## 2021-01-30 DIAGNOSIS — R7303 Prediabetes: Secondary | ICD-10-CM | POA: Diagnosis not present

## 2021-01-30 DIAGNOSIS — G8929 Other chronic pain: Secondary | ICD-10-CM | POA: Diagnosis not present

## 2021-01-30 DIAGNOSIS — K3184 Gastroparesis: Secondary | ICD-10-CM | POA: Diagnosis not present

## 2021-01-30 DIAGNOSIS — G4733 Obstructive sleep apnea (adult) (pediatric): Secondary | ICD-10-CM | POA: Diagnosis not present

## 2021-01-30 DIAGNOSIS — M545 Low back pain, unspecified: Secondary | ICD-10-CM | POA: Diagnosis not present

## 2021-02-01 DIAGNOSIS — M069 Rheumatoid arthritis, unspecified: Secondary | ICD-10-CM | POA: Diagnosis not present

## 2021-02-01 DIAGNOSIS — I10 Essential (primary) hypertension: Secondary | ICD-10-CM | POA: Diagnosis not present

## 2021-02-01 DIAGNOSIS — Z96651 Presence of right artificial knee joint: Secondary | ICD-10-CM | POA: Diagnosis not present

## 2021-02-01 DIAGNOSIS — M797 Fibromyalgia: Secondary | ICD-10-CM | POA: Diagnosis not present

## 2021-02-01 DIAGNOSIS — E039 Hypothyroidism, unspecified: Secondary | ICD-10-CM | POA: Diagnosis not present

## 2021-02-01 DIAGNOSIS — Z87891 Personal history of nicotine dependence: Secondary | ICD-10-CM | POA: Diagnosis not present

## 2021-02-01 DIAGNOSIS — K589 Irritable bowel syndrome without diarrhea: Secondary | ICD-10-CM | POA: Diagnosis not present

## 2021-02-01 DIAGNOSIS — G43909 Migraine, unspecified, not intractable, without status migrainosus: Secondary | ICD-10-CM | POA: Diagnosis not present

## 2021-02-01 DIAGNOSIS — Z9181 History of falling: Secondary | ICD-10-CM | POA: Diagnosis not present

## 2021-02-01 DIAGNOSIS — Z471 Aftercare following joint replacement surgery: Secondary | ICD-10-CM | POA: Diagnosis not present

## 2021-02-01 DIAGNOSIS — M545 Low back pain, unspecified: Secondary | ICD-10-CM | POA: Diagnosis not present

## 2021-02-01 DIAGNOSIS — K3184 Gastroparesis: Secondary | ICD-10-CM | POA: Diagnosis not present

## 2021-02-01 DIAGNOSIS — G8929 Other chronic pain: Secondary | ICD-10-CM | POA: Diagnosis not present

## 2021-02-01 DIAGNOSIS — G4733 Obstructive sleep apnea (adult) (pediatric): Secondary | ICD-10-CM | POA: Diagnosis not present

## 2021-02-01 DIAGNOSIS — Z791 Long term (current) use of non-steroidal anti-inflammatories (NSAID): Secondary | ICD-10-CM | POA: Diagnosis not present

## 2021-02-01 DIAGNOSIS — M4800 Spinal stenosis, site unspecified: Secondary | ICD-10-CM | POA: Diagnosis not present

## 2021-02-01 DIAGNOSIS — R7303 Prediabetes: Secondary | ICD-10-CM | POA: Diagnosis not present

## 2021-02-02 DIAGNOSIS — Z9181 History of falling: Secondary | ICD-10-CM | POA: Diagnosis not present

## 2021-02-02 DIAGNOSIS — M069 Rheumatoid arthritis, unspecified: Secondary | ICD-10-CM | POA: Diagnosis not present

## 2021-02-02 DIAGNOSIS — G4733 Obstructive sleep apnea (adult) (pediatric): Secondary | ICD-10-CM | POA: Diagnosis not present

## 2021-02-02 DIAGNOSIS — E039 Hypothyroidism, unspecified: Secondary | ICD-10-CM | POA: Diagnosis not present

## 2021-02-02 DIAGNOSIS — M545 Low back pain, unspecified: Secondary | ICD-10-CM | POA: Diagnosis not present

## 2021-02-02 DIAGNOSIS — K3184 Gastroparesis: Secondary | ICD-10-CM | POA: Diagnosis not present

## 2021-02-02 DIAGNOSIS — M797 Fibromyalgia: Secondary | ICD-10-CM | POA: Diagnosis not present

## 2021-02-02 DIAGNOSIS — G8929 Other chronic pain: Secondary | ICD-10-CM | POA: Diagnosis not present

## 2021-02-02 DIAGNOSIS — Z96651 Presence of right artificial knee joint: Secondary | ICD-10-CM | POA: Diagnosis not present

## 2021-02-02 DIAGNOSIS — R7303 Prediabetes: Secondary | ICD-10-CM | POA: Diagnosis not present

## 2021-02-02 DIAGNOSIS — K589 Irritable bowel syndrome without diarrhea: Secondary | ICD-10-CM | POA: Diagnosis not present

## 2021-02-02 DIAGNOSIS — I10 Essential (primary) hypertension: Secondary | ICD-10-CM | POA: Diagnosis not present

## 2021-02-02 DIAGNOSIS — Z87891 Personal history of nicotine dependence: Secondary | ICD-10-CM | POA: Diagnosis not present

## 2021-02-02 DIAGNOSIS — G43909 Migraine, unspecified, not intractable, without status migrainosus: Secondary | ICD-10-CM | POA: Diagnosis not present

## 2021-02-02 DIAGNOSIS — Z791 Long term (current) use of non-steroidal anti-inflammatories (NSAID): Secondary | ICD-10-CM | POA: Diagnosis not present

## 2021-02-02 DIAGNOSIS — Z471 Aftercare following joint replacement surgery: Secondary | ICD-10-CM | POA: Diagnosis not present

## 2021-02-02 DIAGNOSIS — M4800 Spinal stenosis, site unspecified: Secondary | ICD-10-CM | POA: Diagnosis not present

## 2021-02-03 DIAGNOSIS — M1711 Unilateral primary osteoarthritis, right knee: Secondary | ICD-10-CM | POA: Diagnosis not present

## 2021-02-09 DIAGNOSIS — M069 Rheumatoid arthritis, unspecified: Secondary | ICD-10-CM | POA: Diagnosis not present

## 2021-02-09 DIAGNOSIS — M62830 Muscle spasm of back: Secondary | ICD-10-CM | POA: Diagnosis not present

## 2021-02-09 DIAGNOSIS — M47817 Spondylosis without myelopathy or radiculopathy, lumbosacral region: Secondary | ICD-10-CM | POA: Diagnosis not present

## 2021-02-09 DIAGNOSIS — G894 Chronic pain syndrome: Secondary | ICD-10-CM | POA: Diagnosis not present

## 2021-02-15 DIAGNOSIS — M1711 Unilateral primary osteoarthritis, right knee: Secondary | ICD-10-CM | POA: Diagnosis not present

## 2021-02-15 DIAGNOSIS — M25661 Stiffness of right knee, not elsewhere classified: Secondary | ICD-10-CM | POA: Diagnosis not present

## 2021-02-15 DIAGNOSIS — M6281 Muscle weakness (generalized): Secondary | ICD-10-CM | POA: Diagnosis not present

## 2021-02-15 DIAGNOSIS — M25561 Pain in right knee: Secondary | ICD-10-CM | POA: Diagnosis not present

## 2021-03-01 DIAGNOSIS — E1169 Type 2 diabetes mellitus with other specified complication: Secondary | ICD-10-CM | POA: Diagnosis not present

## 2021-03-01 DIAGNOSIS — E7800 Pure hypercholesterolemia, unspecified: Secondary | ICD-10-CM | POA: Diagnosis not present

## 2021-03-01 DIAGNOSIS — E039 Hypothyroidism, unspecified: Secondary | ICD-10-CM | POA: Diagnosis not present

## 2021-03-01 DIAGNOSIS — E119 Type 2 diabetes mellitus without complications: Secondary | ICD-10-CM | POA: Diagnosis not present

## 2021-03-01 DIAGNOSIS — I10 Essential (primary) hypertension: Secondary | ICD-10-CM | POA: Diagnosis not present

## 2021-03-01 DIAGNOSIS — M069 Rheumatoid arthritis, unspecified: Secondary | ICD-10-CM | POA: Diagnosis not present

## 2021-03-01 DIAGNOSIS — G43109 Migraine with aura, not intractable, without status migrainosus: Secondary | ICD-10-CM | POA: Diagnosis not present

## 2021-03-01 DIAGNOSIS — G8929 Other chronic pain: Secondary | ICD-10-CM | POA: Diagnosis not present

## 2021-03-02 DIAGNOSIS — G4733 Obstructive sleep apnea (adult) (pediatric): Secondary | ICD-10-CM | POA: Diagnosis not present

## 2021-03-09 DIAGNOSIS — M47817 Spondylosis without myelopathy or radiculopathy, lumbosacral region: Secondary | ICD-10-CM | POA: Diagnosis not present

## 2021-03-09 DIAGNOSIS — G894 Chronic pain syndrome: Secondary | ICD-10-CM | POA: Diagnosis not present

## 2021-03-09 DIAGNOSIS — M62830 Muscle spasm of back: Secondary | ICD-10-CM | POA: Diagnosis not present

## 2021-03-09 DIAGNOSIS — M069 Rheumatoid arthritis, unspecified: Secondary | ICD-10-CM | POA: Diagnosis not present

## 2021-04-04 DIAGNOSIS — E1169 Type 2 diabetes mellitus with other specified complication: Secondary | ICD-10-CM | POA: Diagnosis not present

## 2021-04-04 DIAGNOSIS — M069 Rheumatoid arthritis, unspecified: Secondary | ICD-10-CM | POA: Diagnosis not present

## 2021-04-04 DIAGNOSIS — G8929 Other chronic pain: Secondary | ICD-10-CM | POA: Diagnosis not present

## 2021-04-04 DIAGNOSIS — K219 Gastro-esophageal reflux disease without esophagitis: Secondary | ICD-10-CM | POA: Diagnosis not present

## 2021-04-04 DIAGNOSIS — E039 Hypothyroidism, unspecified: Secondary | ICD-10-CM | POA: Diagnosis not present

## 2021-04-04 DIAGNOSIS — E7800 Pure hypercholesterolemia, unspecified: Secondary | ICD-10-CM | POA: Diagnosis not present

## 2021-04-04 DIAGNOSIS — G43109 Migraine with aura, not intractable, without status migrainosus: Secondary | ICD-10-CM | POA: Diagnosis not present

## 2021-04-04 DIAGNOSIS — I10 Essential (primary) hypertension: Secondary | ICD-10-CM | POA: Diagnosis not present

## 2021-04-06 DIAGNOSIS — M47817 Spondylosis without myelopathy or radiculopathy, lumbosacral region: Secondary | ICD-10-CM | POA: Diagnosis not present

## 2021-04-06 DIAGNOSIS — M069 Rheumatoid arthritis, unspecified: Secondary | ICD-10-CM | POA: Diagnosis not present

## 2021-04-06 DIAGNOSIS — G894 Chronic pain syndrome: Secondary | ICD-10-CM | POA: Diagnosis not present

## 2021-04-06 DIAGNOSIS — M62830 Muscle spasm of back: Secondary | ICD-10-CM | POA: Diagnosis not present

## 2021-05-01 ENCOUNTER — Ambulatory Visit
Admission: RE | Admit: 2021-05-01 | Discharge: 2021-05-01 | Disposition: A | Discharge status: Home / Self Care | Payer: Medicare Other | Source: Ambulatory Visit | Attending: Physical Medicine and Rehabilitation | Admitting: Physical Medicine and Rehabilitation

## 2021-05-01 ENCOUNTER — Other Ambulatory Visit: Payer: Self-pay | Admitting: Physical Medicine and Rehabilitation

## 2021-05-01 ENCOUNTER — Other Ambulatory Visit: Payer: Self-pay

## 2021-05-01 DIAGNOSIS — M545 Low back pain, unspecified: Secondary | ICD-10-CM

## 2021-05-01 DIAGNOSIS — M25561 Pain in right knee: Secondary | ICD-10-CM | POA: Diagnosis not present

## 2021-05-04 DIAGNOSIS — R112 Nausea with vomiting, unspecified: Secondary | ICD-10-CM | POA: Diagnosis not present

## 2021-05-05 ENCOUNTER — Encounter (HOSPITAL_BASED_OUTPATIENT_CLINIC_OR_DEPARTMENT_OTHER): Payer: Self-pay

## 2021-05-05 ENCOUNTER — Other Ambulatory Visit: Payer: Self-pay

## 2021-05-05 DIAGNOSIS — Z7982 Long term (current) use of aspirin: Secondary | ICD-10-CM | POA: Diagnosis not present

## 2021-05-05 DIAGNOSIS — I10 Essential (primary) hypertension: Secondary | ICD-10-CM | POA: Insufficient documentation

## 2021-05-05 DIAGNOSIS — R300 Dysuria: Secondary | ICD-10-CM | POA: Diagnosis not present

## 2021-05-05 DIAGNOSIS — R112 Nausea with vomiting, unspecified: Secondary | ICD-10-CM | POA: Diagnosis not present

## 2021-05-05 DIAGNOSIS — R638 Other symptoms and signs concerning food and fluid intake: Secondary | ICD-10-CM | POA: Diagnosis not present

## 2021-05-05 DIAGNOSIS — Z87891 Personal history of nicotine dependence: Secondary | ICD-10-CM | POA: Insufficient documentation

## 2021-05-05 DIAGNOSIS — N390 Urinary tract infection, site not specified: Secondary | ICD-10-CM | POA: Diagnosis not present

## 2021-05-05 DIAGNOSIS — Z79899 Other long term (current) drug therapy: Secondary | ICD-10-CM | POA: Insufficient documentation

## 2021-05-05 DIAGNOSIS — E039 Hypothyroidism, unspecified: Secondary | ICD-10-CM | POA: Diagnosis not present

## 2021-05-05 DIAGNOSIS — R110 Nausea: Secondary | ICD-10-CM | POA: Diagnosis not present

## 2021-05-05 DIAGNOSIS — Z96653 Presence of artificial knee joint, bilateral: Secondary | ICD-10-CM | POA: Diagnosis not present

## 2021-05-05 LAB — COMPREHENSIVE METABOLIC PANEL
ALT: 11 U/L (ref 0–44)
AST: 14 U/L — ABNORMAL LOW (ref 15–41)
Albumin: 4.2 g/dL (ref 3.5–5.0)
Alkaline Phosphatase: 75 U/L (ref 38–126)
Anion gap: 12 (ref 5–15)
BUN: 15 mg/dL (ref 8–23)
CO2: 24 mmol/L (ref 22–32)
Calcium: 10 mg/dL (ref 8.9–10.3)
Chloride: 98 mmol/L (ref 98–111)
Creatinine, Ser: 0.71 mg/dL (ref 0.44–1.00)
GFR, Estimated: >60 mL/min (ref >60)
Glucose, Bld: 95 mg/dL (ref 70–99)
Potassium: 3.4 mmol/L — ABNORMAL LOW (ref 3.5–5.1)
Sodium: 134 mmol/L — ABNORMAL LOW (ref 135–145)
Total Bilirubin: 0.5 mg/dL (ref 0.3–1.2)
Total Protein: 7.6 g/dL (ref 6.5–8.1)

## 2021-05-05 LAB — CBC
HCT: 43.4 % (ref 36.0–46.0)
Hemoglobin: 14.3 g/dL (ref 12.0–15.0)
MCH: 27.6 pg (ref 26.0–34.0)
MCHC: 32.9 g/dL (ref 30.0–36.0)
MCV: 83.6 fL (ref 80.0–100.0)
Platelets: 488 10*3/uL — ABNORMAL HIGH (ref 150–400)
RBC: 5.19 MIL/uL — ABNORMAL HIGH (ref 3.87–5.11)
RDW: 12.4 % (ref 11.5–15.5)
WBC: 8.2 10*3/uL (ref 4.0–10.5)
nRBC: 0 % (ref 0.0–0.2)

## 2021-05-05 LAB — LIPASE, BLOOD: Lipase: 26 U/L (ref 11–51)

## 2021-05-05 MED ORDER — ONDANSETRON 4 MG PO TBDP
4.0000 mg | ORAL_TABLET | Freq: Once | ORAL | Status: AC | PRN
  Administered 2021-05-05: 4 mg via ORAL
  Filled 2021-05-05: qty 1

## 2021-05-05 NOTE — ED Triage Notes (Signed)
Pt c/o n/v x 2-3 days-denies abd pain and diarrhea-NAD-to triage in w/c

## 2021-05-06 ENCOUNTER — Emergency Department (HOSPITAL_BASED_OUTPATIENT_CLINIC_OR_DEPARTMENT_OTHER)
Admission: EM | Admit: 2021-05-06 | Discharge: 2021-05-06 | Disposition: A | Discharge status: Home / Self Care | Payer: Medicare Other | Attending: Emergency Medicine | Admitting: Emergency Medicine

## 2021-05-06 DIAGNOSIS — N390 Urinary tract infection, site not specified: Secondary | ICD-10-CM

## 2021-05-06 DIAGNOSIS — R110 Nausea: Secondary | ICD-10-CM

## 2021-05-06 LAB — URINALYSIS, DIPSTICK ONLY
Glucose, UA: NEGATIVE mg/dL
Hgb urine dipstick: NEGATIVE
Ketones, ur: 15 mg/dL — AB
Nitrite: NEGATIVE
Protein, ur: 30 mg/dL — AB
Specific Gravity, Urine: 1.03 (ref 1.005–1.030)
pH: 7 (ref 5.0–8.0)

## 2021-05-06 MED ORDER — CEPHALEXIN 500 MG PO CAPS: 500.0000 mg | ORAL_CAPSULE | Freq: Three times a day (TID) | ORAL | 0 refills | Status: DC

## 2021-05-06 MED ORDER — ONDANSETRON 8 MG PO TBDP: ORAL_TABLET | ORAL | 0 refills | Status: DC

## 2021-05-06 MED ORDER — ONDANSETRON HCL 4 MG/2ML IJ SOLN
4.0000 mg | Freq: Once | INTRAMUSCULAR | Status: AC
  Administered 2021-05-06: 4 mg via INTRAVENOUS
  Filled 2021-05-06: qty 2

## 2021-05-06 MED ORDER — CEPHALEXIN 250 MG PO CAPS
500.0000 mg | ORAL_CAPSULE | Freq: Once | ORAL | Status: AC
  Administered 2021-05-06: 500 mg via ORAL
  Filled 2021-05-06: qty 2

## 2021-05-06 MED ORDER — SODIUM CHLORIDE 0.9 % IV BOLUS
1000.0000 mL | Freq: Once | INTRAVENOUS | Status: AC
  Administered 2021-05-06: 1000 mL via INTRAVENOUS

## 2021-05-06 NOTE — ED Provider Notes (Signed)
[facility] EMERGENCY DEPARTMENT Provider Note   CSN: [id number] Arrival date & time: 05/05/21  2213     History Chief Complaint  Patient presents with   Vomiting    Christine Robles is a 65 y.o. female.  Patient is a 64 year old female with past medical history of diabetes, irritable bowel, hypertension, anxiety.  Patient reports feeling nauseated and having decreased p.o. intake since her dose of Ozempic was increased.  She reports having no appetite and has had minimal solids or liquids in several days.  She describes decreased urination.  She denies to me that she is having any abdominal pain, fevers, diarrhea, bloody stools.  She was called in Phenergan suppositories by her primary doctor, however these have not helped.  The history is provided by the patient.      Past Medical History:  Diagnosis Date   Anxiety    Bipolar disorder ([place])    "vs ADHD" (09/26/2016)   Candida esophagitis (HCC)    Chronic lower back pain    Depression    Esophagitis    Fibromyalgia    Gastroparesis    Headache    "q 4 months now" (09/26/2016)   Hypertension    Hypothyroidism    IBS (irritable bowel syndrome)    Migraine    "maybe 1-2/year" (09/26/2016)   Occasional tremors    pt reports hx of hands shaking per pt that come and go   OSA on CPAP    Panic disorder    Pre-diabetes    Rheumatoid arthritis ([place])    "all over" (09/26/2016)   Spinal stenosis    Thyroid disease    hypothyroidism    Patient Active Problem List   Diagnosis Date Noted   S/P TKR (total knee replacement), right 01/24/2021   S/P total knee arthroplasty, right 01/24/2021   Slow transit constipation 03/18/2018   Primary localized osteoarthritis of left knee 09/26/2016   Streptococcal sore throat 08/10/2012    Class: Acute   Hypothyroidism 03/08/2009   PANIC DISORDER 03/08/2009   DEPRESSION 03/08/2009   Essential hypertension 03/08/2009   GERD 03/08/2009   SPINAL STENOSIS 03/08/2009    IRRITABLE BOWEL SYNDROME, HX OF 03/08/2009    Past Surgical History:  Procedure Laterality Date   ABDOMINAL HERNIA REPAIR  02/01/2011   [facility]   BREAST BIOPSY Left    15+ years ago no visual scars, 2x   CARDIAC CATHETERIZATION  2012   DILATION AND CURETTAGE OF UTERUS     KNEE ARTHROSCOPY Left 01/24/2006   hx/notes 10/17/2010   LAPAROSCOPIC CHOLECYSTECTOMY  05/2001   hx/notes 10/17/2010   LIPOMA EXCISION Right 12/2001   thigh, hx/notes 10/17/2010   LUMBAR DISC SURGERY  1990s   POSTERIOR LUMBAR FUSION  12/2008   "L4, L5, S1"   TONSILLECTOMY     TOTAL KNEE ARTHROPLASTY Left 09/26/2016   Procedure: LEFT TOTAL KNEE ARTHROPLASTY;  Surgeon: Ninetta Lights, MD;  Location: [facility];  Service: Orthopedics;  Laterality: Left;   TOTAL KNEE ARTHROPLASTY Right 01/24/2021   Procedure: TOTAL KNEE ARTHROPLASTY;  Surgeon: Marchia Bond, MD;  Location: [facility];  Service: Orthopedics;  Laterality: Right;   TUBAL LIGATION       OB History   No obstetric history on file.     Family History  Problem Relation Age of Onset   Leukemia Mother 67   Hypertension Father    Heart disease Father    Cirrhosis Father    Crohn's disease Father    Heart  disease Brother        pacemaker   Peripheral Artery Disease Brother    Hypertension Brother    Heart disease Maternal Grandmother    Diabetes Brother    Diabetes Maternal Grandfather    Colon cancer Neg Hx     Social History   Tobacco Use   Smoking status: Former    Packs/day: 1.00    Years: 15.00    Pack years: 15.00    Types: Cigarettes    Quit date: 08/08/1998    Years since quitting: 22.7   Smokeless tobacco: Never  Vaping Use   Vaping Use: Never used  Substance Use Topics   Alcohol use: Not Currently   Drug use: Never    Home Medications Prior to Admission medications   Medication Sig Start Date End Date Taking? Authorizing Provider  ALPRAZolam Duanne Moron) 1 MG tablet Take 0.5-1 mg by mouth 2 (two) times daily as needed for anxiety.     [provider]  amLODipine (NORVASC) 5 MG tablet Take 5 mg by mouth in the morning. 09/05/20   [provider]  aspirin EC 325 MG tablet Take 1 tablet (325 mg total) by mouth 2 (two) times daily. 01/25/21   Merlene Pulling K, PA-C  atorvastatin (LIPITOR) 20 MG tablet Take 20 mg by mouth daily.    [provider]  buPROPion ER (WELLBUTRIN SR) 100 MG 12 hr tablet Take 100 mg by mouth in the morning.    [provider]  Cholecalciferol (VITAMIN D3) 50000 units CAPS Take 50,000 Units by mouth every Thursday. 02/11/18   [provider]  FLUoxetine (PROZAC) 20 MG capsule Take 20 mg by mouth daily with breakfast.    [provider]  gabapentin (NEURONTIN) 400 MG capsule Take 1,200 mg by mouth at bedtime. 12/05/20   [provider]  HYDROmorphone (DILAUDID) 2 MG tablet Take 1 tablet (2 mg total) by mouth every 4 (four) hours as needed for severe pain. To use instead of oxycodone for breakthrough pain 01/25/21   Merlene Pulling K, PA-C  lamoTRIgine (LAMICTAL) 200 MG tablet Take 200 mg by mouth in the morning. 12/09/17   [provider]  Levomefolate Glucosamine (METHYLFOLATE PO) Take 15 mg by mouth in the morning.    [provider]  levothyroxine (SYNTHROID, LEVOTHROID) 112 MCG tablet Take 112 mcg by mouth daily before breakfast. 02/25/18   [provider]  lithium carbonate (ESKALITH) 450 MG CR tablet Take 450 mg by mouth at bedtime.    [provider]  Melatonin 10 MG TABS Take 10 mg by mouth at bedtime.    [provider]  metoCLOPramide (REGLAN) 10 MG tablet Take 1 tablet (10 mg total) by mouth every 8 (eight) hours as needed for nausea. 03/25/18   Rozier, Richard T, MD  OLANZapine (ZYPREXA) 10 MG tablet Take 10 mg by mouth at bedtime.    [provider]  Oxycodone HCl 10 MG TABS Take 10 mg by mouth every 4 (four) hours. 08/07/16   [provider]  pantoprazole (PROTONIX) 40 MG tablet TAKE 1  TABLET BY MOUTH TWICE DAILY Patient taking differently: Take 40 mg by mouth daily. 09/20/14   Lafayette Dragon, MD  Polyethyl Glycol-Propyl Glycol 0.4-0.3 % SOLN Place 1-2 drops into both eyes 3 (three) times daily as needed (dry/irritated eyes).    [provider]  potassium chloride SA (KLOR-CON) 20 MEQ tablet Take 1 tablet (20 mEq total) by mouth 2 (two) times daily. 01/12/21  Sherrill Raring, PA-C  RELISTOR 150 MG TABS Take 150 mg by mouth in the morning, at noon, and at bedtime. 03/16/18   [provider]  Semaglutide,0.25 or 0.5MG /DOS, (OZEMPIC, 0.25 OR 0.5 MG/DOSE,) 2 MG/1.5ML SOPN Inject 0.25 mg into the skin every Friday. 10/27/20   [provider]  SIMPONI 50 MG/0.5ML SOAJ Inject 50 mg into the skin every 30 (thirty) days. 12/25/20   [provider]  tiZANidine (ZANAFLEX) 2 MG tablet Take 2 mg by mouth 3 (three) times daily as needed for muscle spasms.    [provider]  triamterene-hydrochlorothiazide (MAXZIDE-25) 37.5-25 MG per tablet Take 1 each (1 tablet total) by mouth daily. 08/14/12   Patrecia Pour, NP    Allergies    Escitalopram oxalate, Buspirone hcl, Fentanyl, and Morphine and related  Review of Systems   Review of Systems  All other systems reviewed and are negative.  Physical Exam Updated Vital Signs BP 125/77 (BP Location: Right Arm)   Pulse 72   Temp 98.2 F (36.8 C) (Oral)   Resp 17   Ht 5\' 6"  (1.676 m)   Wt 90.7 kg   LMP 08/08/1994   SpO2 95%   BMI 32.28 kg/m   Physical Exam Vitals and nursing note reviewed.  Constitutional:      General: She is not in acute distress.    Appearance: She is well-developed. She is not diaphoretic.  HENT:     Head: Normocephalic and atraumatic.  Cardiovascular:     Rate and Rhythm: Normal rate and regular rhythm.     Heart sounds: No murmur heard.   No friction rub. No gallop.  Pulmonary:     Effort: Pulmonary effort is normal. No respiratory distress.     Breath sounds: Normal  breath sounds. No wheezing.  Abdominal:     General: Bowel sounds are normal. There is no distension.     Palpations: Abdomen is soft.     Tenderness: There is no abdominal tenderness.  Musculoskeletal:        General: Normal range of motion.     Cervical back: Normal range of motion and neck supple.  Skin:    General: Skin is warm and dry.  Neurological:     General: No focal deficit present.     Mental Status: She is alert and oriented to person, place, and time.    ED Results / Procedures / Treatments   Labs (all labs ordered are listed, but only abnormal results are displayed) Labs Reviewed  COMPREHENSIVE METABOLIC PANEL - Abnormal; Notable for the following components:      Result Value   Sodium 134 (*)    Potassium 3.4 (*)    AST 14 (*)    All other components within normal limits  CBC - Abnormal; Notable for the following components:   RBC 5.19 (*)    Platelets 488 (*)    All other components within normal limits  LIPASE, BLOOD  URINALYSIS, ROUTINE W REFLEX MICROSCOPIC    EKG None  Radiology No results found.  Procedures Procedures   Medications Ordered in ED Medications  sodium chloride 0.9 % bolus 1,000 mL (has no administration in time range)  ondansetron (ZOFRAN) injection 4 mg (has no administration in time range)  ondansetron (ZOFRAN-ODT) disintegrating tablet 4 mg (4 mg Oral Given 05/05/21 2236)    ED Course  I have reviewed the triage vital signs and the nursing notes.  Pertinent labs & imaging results that were available during  my care of the patient were reviewed by me and considered in my medical decision making (see chart for details).    MDM Rules/Calculators/A&P  Patient presenting here with complaints of as described in the HPI.  Her abdomen is benign and vital signs are stable.  She was hydrated with normal saline and given nausea medications.  Urinalysis suggestive of a urinary tract infection.  This will be treated with Keflex, but  patient seems appropriate for discharge.  She has a benign abdomen, stable vital signs, and is not complaining of any abdominal discomfort, just nausea.  Final Clinical Impression(s) / ED Diagnoses Final diagnoses:  None    Rx / DC Orders ED Discharge Orders     None        Veryl Speak, MD 05/06/21 [phone]

## 2021-05-06 NOTE — Discharge Instructions (Signed)
Begin taking Keflex as prescribed.  Begin taking Zofran as prescribed as needed for nausea.  Stop taking Ozempic until you speak with your primary doctor this week.  Return to the ER if symptoms significantly worsen or change.

## 2021-05-06 NOTE — ED Notes (Signed)
Discharge instructions including follow up care and prescription discussed with pt. Pt verbalized understanding with no questions at this time.

## 2021-05-24 DIAGNOSIS — M47817 Spondylosis without myelopathy or radiculopathy, lumbosacral region: Secondary | ICD-10-CM | POA: Diagnosis not present

## 2021-05-24 DIAGNOSIS — M069 Rheumatoid arthritis, unspecified: Secondary | ICD-10-CM | POA: Diagnosis not present

## 2021-05-24 DIAGNOSIS — G894 Chronic pain syndrome: Secondary | ICD-10-CM | POA: Diagnosis not present

## 2021-05-24 DIAGNOSIS — M62830 Muscle spasm of back: Secondary | ICD-10-CM | POA: Diagnosis not present

## 2021-06-07 DIAGNOSIS — M1711 Unilateral primary osteoarthritis, right knee: Secondary | ICD-10-CM | POA: Diagnosis not present

## 2021-06-12 DIAGNOSIS — K219 Gastro-esophageal reflux disease without esophagitis: Secondary | ICD-10-CM | POA: Diagnosis not present

## 2021-06-12 DIAGNOSIS — G43109 Migraine with aura, not intractable, without status migrainosus: Secondary | ICD-10-CM | POA: Diagnosis not present

## 2021-06-12 DIAGNOSIS — E785 Hyperlipidemia, unspecified: Secondary | ICD-10-CM | POA: Diagnosis not present

## 2021-06-12 DIAGNOSIS — G8929 Other chronic pain: Secondary | ICD-10-CM | POA: Diagnosis not present

## 2021-06-12 DIAGNOSIS — M1711 Unilateral primary osteoarthritis, right knee: Secondary | ICD-10-CM | POA: Diagnosis not present

## 2021-06-12 DIAGNOSIS — R296 Repeated falls: Secondary | ICD-10-CM | POA: Diagnosis not present

## 2021-06-12 DIAGNOSIS — E1169 Type 2 diabetes mellitus with other specified complication: Secondary | ICD-10-CM | POA: Diagnosis not present

## 2021-06-12 DIAGNOSIS — E039 Hypothyroidism, unspecified: Secondary | ICD-10-CM | POA: Diagnosis not present

## 2021-06-12 DIAGNOSIS — I10 Essential (primary) hypertension: Secondary | ICD-10-CM | POA: Diagnosis not present

## 2021-06-12 DIAGNOSIS — M069 Rheumatoid arthritis, unspecified: Secondary | ICD-10-CM | POA: Diagnosis not present

## 2021-06-12 DIAGNOSIS — R251 Tremor, unspecified: Secondary | ICD-10-CM | POA: Diagnosis not present

## 2021-06-13 DIAGNOSIS — G4733 Obstructive sleep apnea (adult) (pediatric): Secondary | ICD-10-CM | POA: Diagnosis not present

## 2021-06-17 DIAGNOSIS — E7800 Pure hypercholesterolemia, unspecified: Secondary | ICD-10-CM | POA: Diagnosis not present

## 2021-06-17 DIAGNOSIS — G8929 Other chronic pain: Secondary | ICD-10-CM | POA: Diagnosis not present

## 2021-06-17 DIAGNOSIS — E1169 Type 2 diabetes mellitus with other specified complication: Secondary | ICD-10-CM | POA: Diagnosis not present

## 2021-06-17 DIAGNOSIS — M069 Rheumatoid arthritis, unspecified: Secondary | ICD-10-CM | POA: Diagnosis not present

## 2021-06-17 DIAGNOSIS — I10 Essential (primary) hypertension: Secondary | ICD-10-CM | POA: Diagnosis not present

## 2021-06-19 ENCOUNTER — Ambulatory Visit: Payer: Medicare Other | Admitting: Cardiology

## 2021-06-20 ENCOUNTER — Ambulatory Visit: Payer: Medicare Other | Admitting: Cardiology

## 2021-06-21 DIAGNOSIS — G894 Chronic pain syndrome: Secondary | ICD-10-CM | POA: Diagnosis not present

## 2021-06-21 DIAGNOSIS — Z79891 Long term (current) use of opiate analgesic: Secondary | ICD-10-CM | POA: Diagnosis not present

## 2021-06-21 DIAGNOSIS — M62830 Muscle spasm of back: Secondary | ICD-10-CM | POA: Diagnosis not present

## 2021-06-21 DIAGNOSIS — M47817 Spondylosis without myelopathy or radiculopathy, lumbosacral region: Secondary | ICD-10-CM | POA: Diagnosis not present

## 2021-06-21 DIAGNOSIS — M069 Rheumatoid arthritis, unspecified: Secondary | ICD-10-CM | POA: Diagnosis not present

## 2021-07-06 ENCOUNTER — Ambulatory Visit: Payer: Medicare Other | Admitting: Cardiology

## 2021-07-21 DIAGNOSIS — M47817 Spondylosis without myelopathy or radiculopathy, lumbosacral region: Secondary | ICD-10-CM | POA: Diagnosis not present

## 2021-07-21 DIAGNOSIS — G894 Chronic pain syndrome: Secondary | ICD-10-CM | POA: Diagnosis not present

## 2021-07-21 DIAGNOSIS — M069 Rheumatoid arthritis, unspecified: Secondary | ICD-10-CM | POA: Diagnosis not present

## 2021-07-21 DIAGNOSIS — M62830 Muscle spasm of back: Secondary | ICD-10-CM | POA: Diagnosis not present

## 2021-07-25 DIAGNOSIS — M47816 Spondylosis without myelopathy or radiculopathy, lumbar region: Secondary | ICD-10-CM | POA: Diagnosis not present

## 2021-08-07 DIAGNOSIS — Z79899 Other long term (current) drug therapy: Secondary | ICD-10-CM | POA: Diagnosis not present

## 2021-08-07 DIAGNOSIS — M7712 Lateral epicondylitis, left elbow: Secondary | ICD-10-CM | POA: Diagnosis not present

## 2021-08-07 DIAGNOSIS — M7061 Trochanteric bursitis, right hip: Secondary | ICD-10-CM | POA: Diagnosis not present

## 2021-08-07 DIAGNOSIS — M0579 Rheumatoid arthritis with rheumatoid factor of multiple sites without organ or systems involvement: Secondary | ICD-10-CM | POA: Diagnosis not present

## 2021-08-07 DIAGNOSIS — M159 Polyosteoarthritis, unspecified: Secondary | ICD-10-CM | POA: Diagnosis not present

## 2021-08-07 DIAGNOSIS — M2550 Pain in unspecified joint: Secondary | ICD-10-CM | POA: Diagnosis not present

## 2021-08-10 ENCOUNTER — Encounter: Payer: Self-pay | Admitting: Neurology

## 2021-08-10 ENCOUNTER — Ambulatory Visit: Payer: Medicare Other | Admitting: Neurology

## 2021-08-10 VITALS — BP 152/83 | HR 70 | Ht 66.0 in | Wt 195.4 lb

## 2021-08-10 DIAGNOSIS — G8929 Other chronic pain: Secondary | ICD-10-CM | POA: Diagnosis not present

## 2021-08-10 DIAGNOSIS — Z79899 Other long term (current) drug therapy: Secondary | ICD-10-CM | POA: Insufficient documentation

## 2021-08-10 DIAGNOSIS — M25439 Effusion, unspecified wrist: Secondary | ICD-10-CM | POA: Insufficient documentation

## 2021-08-10 DIAGNOSIS — R269 Unspecified abnormalities of gait and mobility: Secondary | ICD-10-CM

## 2021-08-10 DIAGNOSIS — M545 Low back pain, unspecified: Secondary | ICD-10-CM | POA: Insufficient documentation

## 2021-08-10 DIAGNOSIS — M069 Rheumatoid arthritis, unspecified: Secondary | ICD-10-CM | POA: Insufficient documentation

## 2021-08-10 DIAGNOSIS — M2550 Pain in unspecified joint: Secondary | ICD-10-CM | POA: Insufficient documentation

## 2021-08-10 DIAGNOSIS — M542 Cervicalgia: Secondary | ICD-10-CM | POA: Diagnosis not present

## 2021-08-10 DIAGNOSIS — F4321 Adjustment disorder with depressed mood: Secondary | ICD-10-CM | POA: Insufficient documentation

## 2021-08-10 DIAGNOSIS — M159 Polyosteoarthritis, unspecified: Secondary | ICD-10-CM | POA: Insufficient documentation

## 2021-08-10 NOTE — Progress Notes (Signed)
Chief Complaint  Patient presents with   New Patient (Initial Visit)    Rm 14. Alone. NP/[facility] Jacelyn Grip MD Mallie Darting, suspect PD. C/o bilateral hand tremors, issues with balance.      ASSESSMENT AND PLAN  Christine Robles is a 66 y.o. female   Gait abnormality Long history of chronic neck pain, chronic low back pain, history of lumbar decompression surgery, Polypharmacy treatment for poorly controlled depression,  On examination, she has brisk bilateral upper extremity reflex, absent bilateral knee and ankle reflex, mild bilateral toe flexion extension weakness, less dependent sensory changes,  Worried about cervical spondylitic myelopathy in the setting of chronic residual lumbar radiculopathy versus peripheral neuropathy, the side effect from polypharmacy also contribute to her gait abnormality  MRI of cervical spine  Encouraged her moderate exercise, using walker to avoid the fall.   DIAGNOSTIC DATA (LABS, IMAGING, TESTING) - I reviewed patient records, labs, notes, testing and imaging myself where available. MRI lumbar from [facility] in  July 2022 1. Mild spinal canal stenosis and moderate bilateral neural  foraminal narrowing at L1-2, stable.  2. Mild spinal canal stenosis at L3-4.  3. Postsurgical changes at L4-5 and L5-S1  MEDICAL HISTORY:  Christine Robles, is a 66 year old female, seen in request by her primary care physician Dr. Jacelyn Grip, Edwyna Shell, for evaluation of bilateral hands tremor, issue with balance, initial evaluation was on August 10, 2021.  I reviewed and summarized the referring note.  Past medical history Hyperlipidemia Depression,  HTN Rheumatoid arthritis Hypothyroidism on supplement  Patient complains of severe depression, she lives alone, tends to sleep a lot, there is a lot of family related stress, she is now on polypharmacy treatment for her depression, Wellbutrin SR 100 mg daily, Prozac 20 mg daily,  lamotrigine 200 mg every morning, Zyprexa 10 mg every night, she also has chronic low back pain, is under pain management, taking oxycodone 10 mg every 4 hours, also Requip 0.25 mg every night for restless leg syndrome, since 2022 she has been taking Reglan at least once a day for her complaints of frequent nausea  She complains of few years history of intermittent bilateral hands tremor, getting worse since 2022, noticed right hand shaking when she holding utensils, the most bothersome symptoms for her is gait abnormality, she complains of significant low back pain with weightbearing and prolonged walking   PHYSICAL EXAM:   Vitals:   08/10/21 1359  BP: (!) 152/83  Pulse: 70  Weight: 195 lb 6.4 oz (88.6 kg)  Height: '5\' 6"'$  (1.676 m)   Not recorded     Body mass index is 31.54 kg/m.  PHYSICAL EXAMNIATION:  Gen: NAD, conversant, well nourised, well groomed                     Cardiovascular: Regular rate rhythm, no peripheral edema, warm, nontender. Eyes: Conjunctivae clear without exudates or hemorrhage Neck: Supple, no carotid bruits. Pulmonary: Clear to auscultation bilaterally   NEUROLOGICAL EXAM:  MENTAL STATUS: Speech:    Speech is normal; fluent and spontaneous with normal comprehension.  Cognition:     Orientation to time, place and person     Normal recent and remote memory     Normal Attention span and concentration     Normal Language, naming, repeating,spontaneous speech     Fund of knowledge   CRANIAL NERVES: CN II: Visual fields are full to confrontation. Pupils are round equal and briskly reactive to light. CN III,  IV, VI: extraocular movement are normal. No ptosis. CN V: Facial sensation is intact to light touch CN VII: Face is symmetric with normal eye closure  CN VIII: Hearing is normal to causal conversation. CN IX, X: Phonation is normal. CN XI: Head turning and shoulder shrug are intact  MOTOR: She has mild bilateral toe extension flexion  weakness  REFLEXES: Reflexes are 2+ and symmetric at the biceps, triceps, absent at knees, and ankles. Plantar responses are flexor.  SENSORY: Length-dependent decreased light touch, vibratory sensation and pinprick to distal shin level  COORDINATION: There is no trunk or limb dysmetria noted.  GAIT/STANCE: She needs push-up to get up from seated position, stiff, cautious, small stride, Could not stand up on tiptoes or heels, positive Romberg sign  REVIEW OF SYSTEMS:  Full 14 system review of systems performed and notable only for as above All other review of systems were negative.   ALLERGIES: Allergies  Allergen Reactions   Escitalopram Oxalate Other (See Comments)    Stroke like symptoms. Pt thinks she may have been given too big a dose.   Gabapentin Other (See Comments)    Off balance, shaking   Buspirone Hcl Other (See Comments)    Abnormal behavior   Fentanyl Other (See Comments)    Headache    Morphine And Related Other (See Comments)    headache    HOME MEDICATIONS: Current Outpatient Medications  Medication Sig Dispense Refill   ALPRAZolam (XANAX) 1 MG tablet Take 0.5-1 mg by mouth 2 (two) times daily as needed for anxiety.     amLODipine (NORVASC) 5 MG tablet Take 5 mg by mouth in the morning.     atorvastatin (LIPITOR) 20 MG tablet Take 20 mg by mouth daily.     buPROPion ER (WELLBUTRIN SR) 100 MG 12 hr tablet Take 100 mg by mouth in the morning.     Cholecalciferol (VITAMIN D3) 50000 units CAPS Take 50,000 Units by mouth every Thursday.  1   FLUoxetine (PROZAC) 20 MG capsule Take 20 mg by mouth daily with breakfast.     lamoTRIgine (LAMICTAL) 200 MG tablet Take 200 mg by mouth in the morning.  1   Levomefolate Glucosamine (METHYLFOLATE PO) Take 15 mg by mouth in the morning.     levothyroxine (SYNTHROID, LEVOTHROID) 112 MCG tablet Take 112 mcg by mouth daily before breakfast.  5   Melatonin 10 MG TABS Take 10 mg by mouth at bedtime.     meloxicam (MOBIC) 15  MG tablet Take 15 mg by mouth daily.     metoCLOPramide (REGLAN) 10 MG tablet Take 1 tablet (10 mg total) by mouth every 8 (eight) hours as needed for nausea. 30 tablet 0   OLANZapine (ZYPREXA) 10 MG tablet Take 10 mg by mouth at bedtime.     ondansetron (ZOFRAN-ODT) 8 MG disintegrating tablet '8mg'$  ODT q4 hours prn nausea 10 tablet 0   Oxycodone HCl 10 MG TABS Take 10 mg by mouth every 4 (four) hours.  0   pantoprazole (PROTONIX) 40 MG tablet TAKE 1 TABLET BY MOUTH TWICE DAILY (Patient taking differently: Take 40 mg by mouth daily.) 60 tablet 2   Polyethyl Glycol-Propyl Glycol 0.4-0.3 % SOLN Place 1-2 drops into both eyes 3 (three) times daily as needed (dry/irritated eyes).     potassium chloride SA (KLOR-CON) 20 MEQ tablet Take 1 tablet (20 mEq total) by mouth 2 (two) times daily. 14 tablet 0   RELISTOR 150 MG TABS Take 150 mg by mouth  in the morning, at noon, and at bedtime.  2   rOPINIRole (REQUIP) 0.25 MG tablet Take 1 tablet by mouth at bedtime.     Semaglutide,0.25 or 0.'5MG'$ /DOS, (OZEMPIC, 0.25 OR 0.5 MG/DOSE,) 2 MG/1.5ML SOPN Inject 0.25 mg into the skin every Friday.     SIMPONI 50 MG/0.5ML SOAJ Inject 50 mg into the skin every 30 (thirty) days.     tiZANidine (ZANAFLEX) 2 MG tablet Take 2 mg by mouth 3 (three) times daily as needed for muscle spasms.     triamterene-hydrochlorothiazide (MAXZIDE-25) 37.5-25 MG per tablet Take 1 each (1 tablet total) by mouth daily. 30 tablet 0   No current facility-administered medications for this visit.    PAST MEDICAL HISTORY: Past Medical History:  Diagnosis Date   Anxiety    Bipolar disorder ([place])    "vs ADHD" (09/26/2016)   Candida esophagitis (HCC)    Chronic lower back pain    Depression    Esophagitis    Fibromyalgia    Gastroparesis    Headache    "q 4 months now" (09/26/2016)   Hypertension    Hypothyroidism    IBS (irritable bowel syndrome)    Migraine    "maybe 1-2/year" (09/26/2016)   Occasional tremors    pt reports hx of  hands shaking per pt that come and go   OSA on CPAP    Panic disorder    Pre-diabetes    Rheumatoid arthritis ([place])    "all over" (09/26/2016)   Spinal stenosis    Thyroid disease    hypothyroidism    PAST SURGICAL HISTORY: Past Surgical History:  Procedure Laterality Date   ABDOMINAL HERNIA REPAIR  02/01/2011   [facility]   BREAST BIOPSY Left    15+ years ago no visual scars, 2x   CARDIAC CATHETERIZATION  2012   DILATION AND CURETTAGE OF UTERUS     KNEE ARTHROSCOPY Left 01/24/2006   hx/notes 10/17/2010   LAPAROSCOPIC CHOLECYSTECTOMY  05/2001   hx/notes 10/17/2010   LIPOMA EXCISION Right 12/2001   thigh, hx/notes 10/17/2010   LUMBAR DISC SURGERY  1990s   POSTERIOR LUMBAR FUSION  12/2008   "L4, L5, S1"   TONSILLECTOMY     TOTAL KNEE ARTHROPLASTY Left 09/26/2016   Procedure: LEFT TOTAL KNEE ARTHROPLASTY;  Surgeon: Ninetta Lights, MD;  Location: [facility];  Service: Orthopedics;  Laterality: Left;   TOTAL KNEE ARTHROPLASTY Right 01/24/2021   Procedure: TOTAL KNEE ARTHROPLASTY;  Surgeon: Marchia Bond, MD;  Location: [facility];  Service: Orthopedics;  Laterality: Right;   TUBAL LIGATION      FAMILY HISTORY: Family History  Problem Relation Age of Onset   Leukemia Mother 43   Hypertension Father    Heart disease Father    Cirrhosis Father    Crohn's disease Father    Heart disease Brother        pacemaker   Peripheral Artery Disease Brother    Hypertension Brother    Heart disease Maternal Grandmother    Diabetes Brother    Diabetes Maternal Grandfather    Colon cancer Neg Hx     SOCIAL HISTORY: Social History   Socioeconomic History   Marital status: Widowed    Spouse name: Not on file   Number of children: 1   Years of education: Not on file   Highest education level: Some college, no degree  Occupational History   Occupation: diabled  Tobacco Use   Smoking status: Former    Packs/day: 1.00  Years: 15.00    Pack years: 15.00    Types: Cigarettes    Quit date:  08/08/1998    Years since quitting: 23.0   Smokeless tobacco: Never  Vaping Use   Vaping Use: Never used  Substance and Sexual Activity   Alcohol use: Not Currently   Drug use: Never   Sexual activity: Not Currently  Other Topics Concern   Not on file  Social History Narrative   Patient is right-handed. She lives alone in a one level home. She drinks 1/2 cup of coffee a day. She exercises occasionally on a stationary bike at home.   Social Determinants of Health   Financial Resource Strain: Not on file  Food Insecurity: Not on file  Transportation Needs: Not on file  Physical Activity: Not on file  Stress: Not on file  Social Connections: Not on file  Intimate Partner Violence: Not on file      Marcial Pacas, M.D. Ph.D.  [facility] [address], [place], [place] [address] 7" yes Ph: [phone] Fax: [phone]  CC:  Vernie Shanks, MD [place],  [place] [address]  Vernie Shanks, MD

## 2021-08-11 ENCOUNTER — Telehealth: Payer: Self-pay | Admitting: Neurology

## 2021-08-11 NOTE — Telephone Encounter (Signed)
[employer] medicare order sent to [facility], [facility] they will reach out the patient to schedule.  ?

## 2021-08-15 ENCOUNTER — Ambulatory Visit: Payer: Medicare Other | Admitting: Cardiology

## 2021-08-15 ENCOUNTER — Encounter: Payer: Self-pay | Admitting: Cardiology

## 2021-08-15 ENCOUNTER — Other Ambulatory Visit: Payer: Self-pay

## 2021-08-15 VITALS — BP 139/80 | HR 80 | Temp 98.1°F | Resp 14 | Ht 66.0 in | Wt 194.0 lb

## 2021-08-15 DIAGNOSIS — E782 Mixed hyperlipidemia: Secondary | ICD-10-CM

## 2021-08-15 DIAGNOSIS — Z87891 Personal history of nicotine dependence: Secondary | ICD-10-CM

## 2021-08-15 DIAGNOSIS — I10 Essential (primary) hypertension: Secondary | ICD-10-CM | POA: Diagnosis not present

## 2021-08-15 DIAGNOSIS — E6609 Other obesity due to excess calories: Secondary | ICD-10-CM

## 2021-08-15 NOTE — Progress Notes (Signed)
? ?Date:  08/15/2021  ? ?ID:  Christine Robles, DOB June 08, 1955, MRN [id number] ? ?PCP:  Vernie Shanks, MD  ?Cardiologist:  Rex Kras, DO, [facility] (established care 12/15/2020) ?Former Cardiology Providers: Dr. Wynonia Lawman ? ?Date: 08/15/21 ?Last Office Visit: 12/15/2020 ? ?Chief Complaint  ?Patient presents with  ? Hypertension  ? Follow-up  ?  6 months  ? ? ?HPI  ?Christine Robles is a 66 y.o. female whose past medical history and cardiovascular risk factors include: Rheumatoid arthritis, hypothyroidism, obesity, hypertension, hypercholesterolemia, gastroparesis, prediabetes, OSA, bipolar, postmenopausal female, advanced age. ? ?Originally referred to the office back in July 2022 for preoperative stratification prior to upcoming right total knee replacement.  Due to reduced functional status due to the osteoarthritis of the knee she underwent an echocardiogram and stress test.  Results reviewed with her in great detail.  She now presents for 8 months follow-up.  She has done well postsurgery.  She is currently on Ozempic and uses a stationary bike for at least 15 minutes a day.  She has lost 35 pounds in the last office visit she is congratulated on her efforts/success through this weight loss journey.  She denies any chest pain or shortness of breath at rest or with effort related activities. ? ?FUNCTIONAL STATUS: ?No structured exercise program or daily routine. ?  ?ALLERGIES: ?Allergies  ?Allergen Reactions  ? Escitalopram Oxalate Other (See Comments)  ?  Stroke like symptoms. Pt thinks she may have been given too big a dose.  ? Gabapentin Other (See Comments)  ?  Off balance, shaking  ? Buspirone Hcl Other (See Comments)  ?  Abnormal behavior  ? Fentanyl Other (See Comments)  ?  Headache   ? Morphine And Related Other (See Comments)  ?  headache  ? ? ?MEDICATION LIST PRIOR TO VISIT: ?Current Meds  ?Medication Sig  ? ALPRAZolam (XANAX) 1 MG tablet Take 0.5-1 mg by mouth 2 (two) times daily as needed for  anxiety.  ? amLODipine (NORVASC) 5 MG tablet Take 5 mg by mouth in the morning.  ? atorvastatin (LIPITOR) 20 MG tablet Take 20 mg by mouth daily.  ? buPROPion ER (WELLBUTRIN SR) 100 MG 12 hr tablet Take 100 mg by mouth in the morning.  ? Cholecalciferol (VITAMIN D3) 50000 units CAPS Take 50,000 Units by mouth every Thursday.  ? FLUoxetine (PROZAC) 20 MG capsule Take 20 mg by mouth daily with breakfast.  ? lamoTRIgine (LAMICTAL) 200 MG tablet Take 200 mg by mouth in the morning.  ? Levomefolate Glucosamine (METHYLFOLATE PO) Take 15 mg by mouth in the morning.  ? levothyroxine (SYNTHROID, LEVOTHROID) 112 MCG tablet Take 112 mcg by mouth daily before breakfast.  ? Melatonin 10 MG TABS Take 10 mg by mouth at bedtime.  ? meloxicam (MOBIC) 15 MG tablet Take 15 mg by mouth daily.  ? metoCLOPramide (REGLAN) 10 MG tablet Take 1 tablet (10 mg total) by mouth every 8 (eight) hours as needed for nausea.  ? Multiple Vitamins-Minerals (MULTIVITAMIN ADULTS 50+) TABS Take by mouth.  ? OLANZapine (ZYPREXA) 10 MG tablet Take 10 mg by mouth at bedtime.  ? ondansetron (ZOFRAN-ODT) 8 MG disintegrating tablet '8mg'$  ODT q4 hours prn nausea  ? Oxycodone HCl 10 MG TABS Take 10 mg by mouth every 4 (four) hours.  ? pantoprazole (PROTONIX) 40 MG tablet TAKE 1 TABLET BY MOUTH TWICE DAILY (Patient taking differently: Take 40 mg by mouth daily.)  ? Polyethyl Glycol-Propyl Glycol 0.4-0.3 % SOLN Place 1-2 drops into  both eyes 3 (three) times daily as needed (dry/irritated eyes).  ? pramipexole (MIRAPEX) 1.5 MG tablet Take 1.5 mg by mouth 3 (three) times daily.  ? RELISTOR 150 MG TABS Take 150 mg by mouth in the morning, at noon, and at bedtime.  ? rOPINIRole (REQUIP) 0.25 MG tablet Take 1 tablet by mouth at bedtime.  ? Semaglutide,0.25 or 0.'5MG'$ /DOS, (OZEMPIC, 0.25 OR 0.5 MG/DOSE,) 2 MG/1.5ML SOPN Inject 0.25 mg into the skin every Friday.  ? SIMPONI 50 MG/0.5ML SOAJ Inject 50 mg into the skin every 30 (thirty) days.  ? tiZANidine (ZANAFLEX) 2 MG  tablet Take 2 mg by mouth 3 (three) times daily as needed for muscle spasms.  ? triamterene-hydrochlorothiazide (MAXZIDE-25) 37.5-25 MG per tablet Take 1 each (1 tablet total) by mouth daily.  ?  ? ?PAST MEDICAL HISTORY: ?Past Medical History:  ?Diagnosis Date  ? Anxiety   ? Bipolar disorder ([place])   ? "vs ADHD" (09/26/2016)  ? Candida esophagitis ([place])   ? Chronic lower back pain   ? Depression   ? Esophagitis   ? Fibromyalgia   ? Gastroparesis   ? Headache   ? "q 4 months now" (09/26/2016)  ? Hypertension   ? Hypothyroidism   ? IBS (irritable bowel syndrome)   ? Migraine   ? "maybe 1-2/year" (09/26/2016)  ? Occasional tremors   ? pt reports hx of hands shaking per pt that come and go  ? OSA on CPAP   ? Panic disorder   ? Pre-diabetes   ? Rheumatoid arthritis ([place])   ? "all over" (09/26/2016)  ? Spinal stenosis   ? Thyroid disease   ? hypothyroidism  ? ? ?PAST SURGICAL HISTORY: ?Past Surgical History:  ?Procedure Laterality Date  ? ABDOMINAL HERNIA REPAIR  02/01/2011  ? [facility]  ? BREAST BIOPSY Left   ? 15+ years ago no visual scars, 2x  ? CARDIAC CATHETERIZATION  2012  ? DILATION AND CURETTAGE OF UTERUS    ? KNEE ARTHROSCOPY Left 01/24/2006  ? hx/notes 10/17/2010  ? LAPAROSCOPIC CHOLECYSTECTOMY  05/2001  ? hx/notes 10/17/2010  ? LIPOMA EXCISION Right 12/2001  ? thigh, hx/notes 10/17/2010  ? [place] SURGERY  1990s  ? POSTERIOR LUMBAR FUSION  12/2008  ? "L4, L5, S1"  ? TONSILLECTOMY    ? TOTAL KNEE ARTHROPLASTY Left 09/26/2016  ? Procedure: LEFT TOTAL KNEE ARTHROPLASTY;  Surgeon: Ninetta Lights, MD;  Location: [facility];  Service: Orthopedics;  Laterality: Left;  ? TOTAL KNEE ARTHROPLASTY Right 01/24/2021  ? Procedure: TOTAL KNEE ARTHROPLASTY;  Surgeon: Marchia Bond, MD;  Location: [facility];  Service: Orthopedics;  Laterality: Right;  ? TUBAL LIGATION    ? ? ?FAMILY HISTORY: ?The patient family history includes Cirrhosis in her father; Crohn's disease in her father; Diabetes in her brother and maternal grandfather; Heart disease  in her brother, father, and maternal grandmother; Hypertension in her brother and father; Leukemia (age of onset: 16) in her mother; Peripheral Artery Disease in her brother. ? ?SOCIAL HISTORY:  ?The patient  reports that she quit smoking about 23 years ago. Her smoking use included cigarettes. She has a 15.00 pack-year smoking history. She has never used smokeless tobacco. She reports that she does not currently use alcohol. She reports that she does not use drugs. ? ?REVIEW OF SYSTEMS: ?Review of Systems  ?Cardiovascular:  Negative for chest pain, claudication, dyspnea on exertion, leg swelling, near-syncope, orthopnea, palpitations, paroxysmal nocturnal dyspnea and syncope.  ?Respiratory:  Negative for shortness of breath.   ? ?  PHYSICAL EXAM: ?Vitals with BMI 08/15/2021 08/10/2021 05/06/2021  ?Height '5\' 6"'$  '5\' 6"'$  -  ?Weight 194 lbs 195 lbs 6 oz -  ?BMI 31.33 31.55 -  ?Systolic 726 203 559  ?Diastolic 80 83 69  ?Pulse 80 70 76  ?Some encounter information is confidential and restricted. Go to Review Flowsheets activity to see all data.  ? ? ?CONSTITUTIONAL: Well-developed and well-nourished. No acute distress.  ?SKIN: Skin is warm and dry. No rash noted. No cyanosis. No pallor. No jaundice ?HEAD: Normocephalic and atraumatic.  ?EYES: No scleral icterus ?MOUTH/THROAT: Moist oral membranes.  ?NECK: No JVD present. No thyromegaly noted. No carotid bruits  ?LYMPHATIC: No visible cervical adenopathy.  ?CHEST Normal respiratory effort. No intercostal retractions  ?LUNGS: Clear to auscultation bilaterally. No stridor. No wheezes. No rales.  ?CARDIOVASCULAR: Regular rate and rhythm, positive S1-S2, no murmurs rubs or gallops appreciated. ?ABDOMINAL: Obese, soft, nontender, nondistended, positive bowel sounds all 4 quadrants. No apparent ascites.  ?EXTREMITIES: No peripheral edema.  2+ bilateral dorsalis pedis and posterior tibial pulses. ?HEMATOLOGIC: No significant bruising ?NEUROLOGIC: Oriented to person, place, and time.  Nonfocal. Normal muscle tone.  ?PSYCHIATRIC: Normal mood and affect. Normal behavior. Cooperative ? ?CARDIAC DATABASE: ?EKG: ?08/15/2021: NSR, 73 bpm, normal axis, without underlying ischemia or injury pattern. ?

## 2021-08-17 DIAGNOSIS — M47817 Spondylosis without myelopathy or radiculopathy, lumbosacral region: Secondary | ICD-10-CM | POA: Diagnosis not present

## 2021-08-17 DIAGNOSIS — G894 Chronic pain syndrome: Secondary | ICD-10-CM | POA: Diagnosis not present

## 2021-08-17 DIAGNOSIS — M069 Rheumatoid arthritis, unspecified: Secondary | ICD-10-CM | POA: Diagnosis not present

## 2021-08-17 DIAGNOSIS — M62830 Muscle spasm of back: Secondary | ICD-10-CM | POA: Diagnosis not present

## 2021-08-22 DIAGNOSIS — R296 Repeated falls: Secondary | ICD-10-CM | POA: Diagnosis not present

## 2021-08-22 DIAGNOSIS — M1711 Unilateral primary osteoarthritis, right knee: Secondary | ICD-10-CM | POA: Diagnosis not present

## 2021-08-22 DIAGNOSIS — M069 Rheumatoid arthritis, unspecified: Secondary | ICD-10-CM | POA: Diagnosis not present

## 2021-08-22 DIAGNOSIS — R251 Tremor, unspecified: Secondary | ICD-10-CM | POA: Diagnosis not present

## 2021-08-22 DIAGNOSIS — R0989 Other specified symptoms and signs involving the circulatory and respiratory systems: Secondary | ICD-10-CM | POA: Diagnosis not present

## 2021-08-22 DIAGNOSIS — E1169 Type 2 diabetes mellitus with other specified complication: Secondary | ICD-10-CM | POA: Diagnosis not present

## 2021-08-22 DIAGNOSIS — G8929 Other chronic pain: Secondary | ICD-10-CM | POA: Diagnosis not present

## 2021-08-22 DIAGNOSIS — I10 Essential (primary) hypertension: Secondary | ICD-10-CM | POA: Diagnosis not present

## 2021-08-22 DIAGNOSIS — G43109 Migraine with aura, not intractable, without status migrainosus: Secondary | ICD-10-CM | POA: Diagnosis not present

## 2021-08-22 DIAGNOSIS — E039 Hypothyroidism, unspecified: Secondary | ICD-10-CM | POA: Diagnosis not present

## 2021-08-22 DIAGNOSIS — E785 Hyperlipidemia, unspecified: Secondary | ICD-10-CM | POA: Diagnosis not present

## 2021-08-25 ENCOUNTER — Ambulatory Visit
Admission: RE | Admit: 2021-08-25 | Discharge: 2021-08-25 | Disposition: A | Discharge status: Home / Self Care | Payer: Medicare Other | Source: Ambulatory Visit | Attending: Neurology | Admitting: Neurology

## 2021-08-25 ENCOUNTER — Other Ambulatory Visit: Payer: Self-pay

## 2021-08-25 DIAGNOSIS — M069 Rheumatoid arthritis, unspecified: Secondary | ICD-10-CM | POA: Diagnosis not present

## 2021-08-25 DIAGNOSIS — E7800 Pure hypercholesterolemia, unspecified: Secondary | ICD-10-CM | POA: Diagnosis not present

## 2021-08-25 DIAGNOSIS — E1169 Type 2 diabetes mellitus with other specified complication: Secondary | ICD-10-CM | POA: Diagnosis not present

## 2021-08-25 DIAGNOSIS — R269 Unspecified abnormalities of gait and mobility: Secondary | ICD-10-CM

## 2021-08-25 DIAGNOSIS — M542 Cervicalgia: Secondary | ICD-10-CM | POA: Diagnosis not present

## 2021-08-25 DIAGNOSIS — G8929 Other chronic pain: Secondary | ICD-10-CM

## 2021-08-25 DIAGNOSIS — I10 Essential (primary) hypertension: Secondary | ICD-10-CM | POA: Diagnosis not present

## 2021-08-29 ENCOUNTER — Telehealth: Payer: Self-pay | Admitting: Neurology

## 2021-08-29 NOTE — Telephone Encounter (Signed)
I called patient. I discussed her MRI cervical spine results and recommendations. She reports that she already has a neurosurgeon and pain management. She is agreeable to a follow up with Dr. Krista Blue on 10/24/20 at 1:00pm (she needs afternoon appointments). Pt verbalized understanding of results. Pt had no questions at this time but was encouraged to call back if questions arise. ? ?

## 2021-08-29 NOTE — Telephone Encounter (Signed)
Please call patient, MRI of cervical spine showed multilevel degenerative changes ? ?There is evidence of mild to moderate spinal stenosis at C5-6 level, variable degree of foraminal narrowing ? ?If she has significant neck pain, I will refer her to [place] [facility] pain management, if she does not have significant pain, it is okay for her to continue moderate exercise, give her follow-up visit in 2 to 3 months at my next available ? ?IMPRESSION: This MRI of the cervical spine without contrast shows the following: ?1.   The spinal cord appears normal. ?2.   At C2-C3, there is mild spinal stenosis due to minimal anterolisthesis and other degenerative changes.  There is no nerve root compression. ?3.   At C4-C5, there is mild spinal stenosis due to minimal retrolisthesis and other degenerative changes.  There is moderate right foraminal narrowing though there does not appear to be nerve root compression. ?4.   At C5-C6, there is mild to moderate spinal stenosis due to minimal retrolisthesis and other degenerative change.  There is moderately severe right foraminal narrowing with potential for right C6 nerve root compression. ?5.   Milder degenerative changes at C3-C4, C6-C7, C7-T1 and T1-T2 do not lead to spinal stenosis or nerve root compression ?

## 2021-09-01 DIAGNOSIS — R0989 Other specified symptoms and signs involving the circulatory and respiratory systems: Secondary | ICD-10-CM | POA: Diagnosis not present

## 2021-09-13 DIAGNOSIS — M62830 Muscle spasm of back: Secondary | ICD-10-CM | POA: Diagnosis not present

## 2021-09-13 DIAGNOSIS — M47817 Spondylosis without myelopathy or radiculopathy, lumbosacral region: Secondary | ICD-10-CM | POA: Diagnosis not present

## 2021-09-13 DIAGNOSIS — M069 Rheumatoid arthritis, unspecified: Secondary | ICD-10-CM | POA: Diagnosis not present

## 2021-09-13 DIAGNOSIS — G894 Chronic pain syndrome: Secondary | ICD-10-CM | POA: Diagnosis not present

## 2021-09-27 DIAGNOSIS — I10 Essential (primary) hypertension: Secondary | ICD-10-CM | POA: Diagnosis not present

## 2021-09-27 DIAGNOSIS — G8929 Other chronic pain: Secondary | ICD-10-CM | POA: Diagnosis not present

## 2021-09-27 DIAGNOSIS — K219 Gastro-esophageal reflux disease without esophagitis: Secondary | ICD-10-CM | POA: Diagnosis not present

## 2021-09-27 DIAGNOSIS — E119 Type 2 diabetes mellitus without complications: Secondary | ICD-10-CM | POA: Diagnosis not present

## 2021-09-27 DIAGNOSIS — E785 Hyperlipidemia, unspecified: Secondary | ICD-10-CM | POA: Diagnosis not present

## 2021-09-27 DIAGNOSIS — M1711 Unilateral primary osteoarthritis, right knee: Secondary | ICD-10-CM | POA: Diagnosis not present

## 2021-09-27 DIAGNOSIS — E7800 Pure hypercholesterolemia, unspecified: Secondary | ICD-10-CM | POA: Diagnosis not present

## 2021-09-27 DIAGNOSIS — M069 Rheumatoid arthritis, unspecified: Secondary | ICD-10-CM | POA: Diagnosis not present

## 2021-09-27 DIAGNOSIS — G43109 Migraine with aura, not intractable, without status migrainosus: Secondary | ICD-10-CM | POA: Diagnosis not present

## 2021-10-18 DIAGNOSIS — U071 COVID-19: Secondary | ICD-10-CM | POA: Diagnosis not present

## 2021-10-24 ENCOUNTER — Ambulatory Visit: Payer: Medicare Other | Admitting: Neurology

## 2021-10-24 DIAGNOSIS — G4733 Obstructive sleep apnea (adult) (pediatric): Secondary | ICD-10-CM | POA: Diagnosis not present

## 2021-10-25 DIAGNOSIS — M62830 Muscle spasm of back: Secondary | ICD-10-CM | POA: Diagnosis not present

## 2021-10-25 DIAGNOSIS — G894 Chronic pain syndrome: Secondary | ICD-10-CM | POA: Diagnosis not present

## 2021-10-25 DIAGNOSIS — M47817 Spondylosis without myelopathy or radiculopathy, lumbosacral region: Secondary | ICD-10-CM | POA: Diagnosis not present

## 2021-10-25 DIAGNOSIS — M069 Rheumatoid arthritis, unspecified: Secondary | ICD-10-CM | POA: Diagnosis not present

## 2021-11-09 DIAGNOSIS — I10 Essential (primary) hypertension: Secondary | ICD-10-CM | POA: Diagnosis not present

## 2021-11-09 DIAGNOSIS — E039 Hypothyroidism, unspecified: Secondary | ICD-10-CM | POA: Diagnosis not present

## 2021-11-09 DIAGNOSIS — E7800 Pure hypercholesterolemia, unspecified: Secondary | ICD-10-CM | POA: Diagnosis not present

## 2021-11-09 DIAGNOSIS — K219 Gastro-esophageal reflux disease without esophagitis: Secondary | ICD-10-CM | POA: Diagnosis not present

## 2021-11-09 DIAGNOSIS — M069 Rheumatoid arthritis, unspecified: Secondary | ICD-10-CM | POA: Diagnosis not present

## 2021-11-09 DIAGNOSIS — E119 Type 2 diabetes mellitus without complications: Secondary | ICD-10-CM | POA: Diagnosis not present

## 2021-11-21 DIAGNOSIS — Z0000 Encounter for general adult medical examination without abnormal findings: Secondary | ICD-10-CM | POA: Diagnosis not present

## 2021-11-22 DIAGNOSIS — M47817 Spondylosis without myelopathy or radiculopathy, lumbosacral region: Secondary | ICD-10-CM | POA: Diagnosis not present

## 2021-11-22 DIAGNOSIS — M069 Rheumatoid arthritis, unspecified: Secondary | ICD-10-CM | POA: Diagnosis not present

## 2021-11-22 DIAGNOSIS — M62830 Muscle spasm of back: Secondary | ICD-10-CM | POA: Diagnosis not present

## 2021-11-22 DIAGNOSIS — G894 Chronic pain syndrome: Secondary | ICD-10-CM | POA: Diagnosis not present

## 2021-12-12 DIAGNOSIS — E039 Hypothyroidism, unspecified: Secondary | ICD-10-CM | POA: Diagnosis not present

## 2021-12-12 DIAGNOSIS — K219 Gastro-esophageal reflux disease without esophagitis: Secondary | ICD-10-CM | POA: Diagnosis not present

## 2021-12-12 DIAGNOSIS — E119 Type 2 diabetes mellitus without complications: Secondary | ICD-10-CM | POA: Diagnosis not present

## 2021-12-12 DIAGNOSIS — I10 Essential (primary) hypertension: Secondary | ICD-10-CM | POA: Diagnosis not present

## 2021-12-12 DIAGNOSIS — E7800 Pure hypercholesterolemia, unspecified: Secondary | ICD-10-CM | POA: Diagnosis not present

## 2021-12-12 DIAGNOSIS — M069 Rheumatoid arthritis, unspecified: Secondary | ICD-10-CM | POA: Diagnosis not present

## 2021-12-12 DIAGNOSIS — G8929 Other chronic pain: Secondary | ICD-10-CM | POA: Diagnosis not present

## 2021-12-20 DIAGNOSIS — G894 Chronic pain syndrome: Secondary | ICD-10-CM | POA: Diagnosis not present

## 2021-12-20 DIAGNOSIS — M47817 Spondylosis without myelopathy or radiculopathy, lumbosacral region: Secondary | ICD-10-CM | POA: Diagnosis not present

## 2021-12-20 DIAGNOSIS — M62830 Muscle spasm of back: Secondary | ICD-10-CM | POA: Diagnosis not present

## 2021-12-20 DIAGNOSIS — M069 Rheumatoid arthritis, unspecified: Secondary | ICD-10-CM | POA: Diagnosis not present

## 2022-01-16 ENCOUNTER — Ambulatory Visit: Payer: Medicare Other | Admitting: Neurology

## 2022-01-16 ENCOUNTER — Telehealth: Payer: Self-pay | Admitting: Neurology

## 2022-01-16 NOTE — Telephone Encounter (Signed)
Pt called and said she is sick and can't make appointment.

## 2022-01-16 NOTE — Telephone Encounter (Signed)
Noted  

## 2022-05-10 DIAGNOSIS — E785 Hyperlipidemia, unspecified: Secondary | ICD-10-CM | POA: Diagnosis not present

## 2022-05-10 DIAGNOSIS — G894 Chronic pain syndrome: Secondary | ICD-10-CM | POA: Diagnosis not present

## 2022-05-10 DIAGNOSIS — M069 Rheumatoid arthritis, unspecified: Secondary | ICD-10-CM | POA: Diagnosis not present

## 2022-05-10 DIAGNOSIS — M47817 Spondylosis without myelopathy or radiculopathy, lumbosacral region: Secondary | ICD-10-CM | POA: Diagnosis not present

## 2022-05-10 DIAGNOSIS — G8929 Other chronic pain: Secondary | ICD-10-CM | POA: Diagnosis not present

## 2022-05-10 DIAGNOSIS — I10 Essential (primary) hypertension: Secondary | ICD-10-CM | POA: Diagnosis not present

## 2022-05-10 DIAGNOSIS — E1169 Type 2 diabetes mellitus with other specified complication: Secondary | ICD-10-CM | POA: Diagnosis not present

## 2022-05-10 DIAGNOSIS — M62830 Muscle spasm of back: Secondary | ICD-10-CM | POA: Diagnosis not present

## 2022-05-10 DIAGNOSIS — K219 Gastro-esophageal reflux disease without esophagitis: Secondary | ICD-10-CM | POA: Diagnosis not present

## 2022-05-29 ENCOUNTER — Ambulatory Visit: Payer: Medicare Other | Admitting: Neurology

## 2022-05-31 ENCOUNTER — Encounter: Payer: Self-pay | Admitting: Nurse Practitioner

## 2022-05-31 DIAGNOSIS — G4733 Obstructive sleep apnea (adult) (pediatric): Secondary | ICD-10-CM | POA: Diagnosis not present

## 2022-06-08 DIAGNOSIS — I10 Essential (primary) hypertension: Secondary | ICD-10-CM | POA: Diagnosis not present

## 2022-06-08 DIAGNOSIS — E7800 Pure hypercholesterolemia, unspecified: Secondary | ICD-10-CM | POA: Diagnosis not present

## 2022-06-08 DIAGNOSIS — E119 Type 2 diabetes mellitus without complications: Secondary | ICD-10-CM | POA: Diagnosis not present

## 2022-06-08 DIAGNOSIS — E039 Hypothyroidism, unspecified: Secondary | ICD-10-CM | POA: Diagnosis not present

## 2022-06-13 ENCOUNTER — Ambulatory Visit: Payer: Medicare Other | Admitting: Nurse Practitioner

## 2022-06-13 ENCOUNTER — Telehealth: Payer: Self-pay | Admitting: Nurse Practitioner

## 2022-06-13 NOTE — Telephone Encounter (Signed)
Noted  

## 2022-06-13 NOTE — Telephone Encounter (Signed)
Good Morning Colleen,   Patient called stating that she would not be able to make it to her appointment with you today at 2:00 due to waking up with a migraine.   Patient was rescheduled with you for 1/30 at 2:00

## 2022-06-14 DIAGNOSIS — M62830 Muscle spasm of back: Secondary | ICD-10-CM | POA: Diagnosis not present

## 2022-06-14 DIAGNOSIS — M069 Rheumatoid arthritis, unspecified: Secondary | ICD-10-CM | POA: Diagnosis not present

## 2022-06-14 DIAGNOSIS — G894 Chronic pain syndrome: Secondary | ICD-10-CM | POA: Diagnosis not present

## 2022-06-14 DIAGNOSIS — M47817 Spondylosis without myelopathy or radiculopathy, lumbosacral region: Secondary | ICD-10-CM | POA: Diagnosis not present

## 2022-07-03 ENCOUNTER — Ambulatory Visit: Payer: Medicare Other | Admitting: Nurse Practitioner

## 2022-07-03 ENCOUNTER — Telehealth: Payer: Self-pay | Admitting: Nurse Practitioner

## 2022-07-03 NOTE — Telephone Encounter (Signed)
Good morning Christine Robles,   Patient called stating that she would not be able to make it to her appointment with you today 1/30 at 2:00 due to having diarrhea.  Patient was rescheduled with you for 2/23 at 11:30.

## 2022-07-05 DIAGNOSIS — I10 Essential (primary) hypertension: Secondary | ICD-10-CM | POA: Diagnosis not present

## 2022-07-05 DIAGNOSIS — M1711 Unilateral primary osteoarthritis, right knee: Secondary | ICD-10-CM | POA: Diagnosis not present

## 2022-07-05 DIAGNOSIS — E039 Hypothyroidism, unspecified: Secondary | ICD-10-CM | POA: Diagnosis not present

## 2022-07-05 DIAGNOSIS — M069 Rheumatoid arthritis, unspecified: Secondary | ICD-10-CM | POA: Diagnosis not present

## 2022-07-05 DIAGNOSIS — E1169 Type 2 diabetes mellitus with other specified complication: Secondary | ICD-10-CM | POA: Diagnosis not present

## 2022-07-05 DIAGNOSIS — G8929 Other chronic pain: Secondary | ICD-10-CM | POA: Diagnosis not present

## 2022-07-05 DIAGNOSIS — E785 Hyperlipidemia, unspecified: Secondary | ICD-10-CM | POA: Diagnosis not present

## 2022-07-16 DIAGNOSIS — M47817 Spondylosis without myelopathy or radiculopathy, lumbosacral region: Secondary | ICD-10-CM | POA: Diagnosis not present

## 2022-07-16 DIAGNOSIS — G894 Chronic pain syndrome: Secondary | ICD-10-CM | POA: Diagnosis not present

## 2022-07-16 DIAGNOSIS — M62830 Muscle spasm of back: Secondary | ICD-10-CM | POA: Diagnosis not present

## 2022-07-16 DIAGNOSIS — M069 Rheumatoid arthritis, unspecified: Secondary | ICD-10-CM | POA: Diagnosis not present

## 2022-07-23 ENCOUNTER — Ambulatory Visit: Payer: Medicare Other | Admitting: Neurology

## 2022-07-23 ENCOUNTER — Encounter: Payer: Self-pay | Admitting: Neurology

## 2022-07-23 VITALS — BP 148/87 | HR 78 | Ht 66.0 in | Wt 188.0 lb

## 2022-07-23 DIAGNOSIS — G2581 Restless legs syndrome: Secondary | ICD-10-CM | POA: Diagnosis not present

## 2022-07-23 DIAGNOSIS — R7309 Other abnormal glucose: Secondary | ICD-10-CM | POA: Diagnosis not present

## 2022-07-23 DIAGNOSIS — F3132 Bipolar disorder, current episode depressed, moderate: Secondary | ICD-10-CM | POA: Insufficient documentation

## 2022-07-23 DIAGNOSIS — R799 Abnormal finding of blood chemistry, unspecified: Secondary | ICD-10-CM | POA: Diagnosis not present

## 2022-07-23 DIAGNOSIS — Z79899 Other long term (current) drug therapy: Secondary | ICD-10-CM | POA: Diagnosis not present

## 2022-07-23 DIAGNOSIS — E538 Deficiency of other specified B group vitamins: Secondary | ICD-10-CM | POA: Diagnosis not present

## 2022-07-23 DIAGNOSIS — R5382 Chronic fatigue, unspecified: Secondary | ICD-10-CM | POA: Insufficient documentation

## 2022-07-23 MED ORDER — GABAPENTIN 100 MG PO CAPS: 100.0000 mg | ORAL_CAPSULE | Freq: Three times a day (TID) | ORAL | 6 refills | Status: DC

## 2022-07-23 NOTE — Progress Notes (Unsigned)
Chief Complaint  Patient presents with   Follow-up    Rm 15. Alone. Pt's balance has much improved. Pt c/o restless leg syndrome.       ASSESSMENT AND PLAN  [name] is a 67 y.o. female  Restless leg symptoms Polypharmacy treatment for depression,  Ferritin level  She is already on polypharmacy treatment, I have encouraged her moderate exercise, healthy lifestyle,  Will add on gabapentin 100 mg as needed,  Continue follow-up with her primary care and pain management, only return to clinic as needed  DIAGNOSTIC DATA (LABS, IMAGING, TESTING) - I reviewed patient records, labs, notes, testing and imaging myself where available. MRI lumbar from [facility] in  July 2022 1. Mild spinal canal stenosis and moderate bilateral neural  foraminal narrowing at L1-2, stable.  2. Mild spinal canal stenosis at L3-4.  3. Postsurgical changes at L4-5 and L5-S1  MEDICAL HISTORY:  Christine Robles, is a 67 year old female, seen in request by her primary care physician Dr. Jacelyn Grip, Edwyna Shell, for evaluation of bilateral hands tremor, issue with balance, initial evaluation was on August 10, 2021.  I reviewed and summarized the referring note.  Past medical history Hyperlipidemia Depression,  HTN Rheumatoid arthritis Hypothyroidism on supplement  Patient complains of severe depression, she lives alone, tends to sleep a lot, there is a lot of family related stress, she is now on polypharmacy treatment for her depression, Wellbutrin SR 100 mg daily, Prozac 20 mg daily, lamotrigine 200 mg every morning, Zyprexa 10 mg every night, she also has chronic low back pain, is under pain management, taking oxycodone 10 mg every 4 hours, also Requip 0.25 mg every night for restless leg syndrome, since 2022 she has been taking Reglan at least once a day for her complaints of frequent nausea  She complains of few years history of intermittent bilateral hands tremor, getting worse since 2022, noticed  right hand shaking when she holding utensils, the most bothersome symptoms for her is gait abnormality, she complains of significant low back pain with weightbearing and prolonged walking  UPDATE Jul 23 2022: She lives at home alone, spends most of the time sedentary, watching TV day and night, barely go out exercise, still on polypharmacy treatment for her mood disorder, she was on higher dose of gabapentin, complains of dizziness with polypharmacy, now only on 100 mg every night, with her Seroquel 150 mg at night, melatonin, she can falling to sleep, but she often wake up in the middle of the night few hours later, urged to move her leg, has to pace around, trouble going back to sleep, she denies difficulty with her leg denies urge to move her leg when she was sitting on her sofa or bed watching TV at the evening time   She also drink multiple caffeine containing beverage throughout the day,  PHYSICAL EXAM:   Vitals:   07/23/22 1459  BP: (!) 148/87  Pulse: 78  Weight: 188 lb (85.3 kg)  Height: 5' 6"$  (1.676 m)   Not recorded     Body mass index is 30.34 kg/m.  PHYSICAL EXAMNIATION:  Gen: NAD, conversant, well nourised, well groomed                     Cardiovascular: Regular rate rhythm, no peripheral edema, warm, nontender. Eyes: Conjunctivae clear without exudates or hemorrhage Neck: Supple, no carotid bruits. Pulmonary: Clear to auscultation bilaterally   NEUROLOGICAL EXAM:  MENTAL STATUS: Speech/cognition: Awake, alert, oriented to history  taking and care conversation   CRANIAL NERVES: CN II: Visual fields are full to confrontation. Pupils are round equal and briskly reactive to light. CN III, IV, VI: extraocular movement are normal. No ptosis. CN V: Facial sensation is intact to light touch CN VII: Face is symmetric with normal eye closure  CN VIII: Hearing is normal to causal conversation. CN IX, X: Phonation is normal. CN XI: Head turning and shoulder shrug are  intact  MOTOR: No significant upper or lower extremity weakness  REFLEXES: Reflexes are 2+ and symmetric at the biceps, triceps, absent at knees, and ankles. Plantar responses are flexor.  SENSORY: Length-dependent decreased light touch, vibratory sensation and pinprick to distal shin level  COORDINATION: There is no trunk or limb dysmetria noted.  GAIT/STANCE: She can get up from seated position arm crossed, cautious,  REVIEW OF SYSTEMS:  Full 14 system review of systems performed and notable only for as above All other review of systems were negative.   ALLERGIES: Allergies  Allergen Reactions   Escitalopram Oxalate Other (See Comments)    Stroke like symptoms. Pt thinks she may have been given too big a dose.   Gabapentin Other (See Comments)    Off balance, shaking   Buspirone Hcl Other (See Comments)    Abnormal behavior   Fentanyl Other (See Comments)    Headache    Morphine And Related Other (See Comments)    headache    HOME MEDICATIONS: Current Outpatient Medications  Medication Sig Dispense Refill   ALPRAZolam (XANAX) 1 MG tablet Take 0.5-1 mg by mouth 2 (two) times daily as needed for anxiety.     amLODipine (NORVASC) 5 MG tablet Take 5 mg by mouth in the morning.     atorvastatin (LIPITOR) 20 MG tablet Take 20 mg by mouth daily.     buPROPion ER (WELLBUTRIN SR) 100 MG 12 hr tablet Take 100 mg by mouth in the morning.     Cholecalciferol (VITAMIN D3) 50000 units CAPS Take 50,000 Units by mouth every Thursday.  1   FLUoxetine (PROZAC) 20 MG capsule Take 20 mg by mouth daily with breakfast.     lamoTRIgine (LAMICTAL) 200 MG tablet Take 200 mg by mouth in the morning.  1   Levomefolate Glucosamine (METHYLFOLATE PO) Take 15 mg by mouth in the morning.     levothyroxine (SYNTHROID, LEVOTHROID) 112 MCG tablet Take 112 mcg by mouth daily before breakfast.  5   Melatonin 10 MG TABS Take 10 mg by mouth at bedtime.     meloxicam (MOBIC) 15 MG tablet Take 15 mg by mouth  daily.     metoCLOPramide (REGLAN) 10 MG tablet Take 1 tablet (10 mg total) by mouth every 8 (eight) hours as needed for nausea. 30 tablet 0   Multiple Vitamins-Minerals (MULTIVITAMIN ADULTS 50+) TABS Take by mouth.     ondansetron (ZOFRAN-ODT) 8 MG disintegrating tablet 51m ODT q4 hours prn nausea 10 tablet 0   oxyCODONE-acetaminophen (PERCOCET) 7.5-325 MG tablet Take 1 tablet by mouth every 4 (four) hours as needed.     pantoprazole (PROTONIX) 40 MG tablet TAKE 1 TABLET BY MOUTH TWICE DAILY (Patient taking differently: Take 40 mg by mouth daily.) 60 tablet 2   Polyethyl Glycol-Propyl Glycol 0.4-0.3 % SOLN Place 1-2 drops into both eyes 3 (three) times daily as needed (dry/irritated eyes).     QUEtiapine Fumarate (SEROQUEL XR) 150 MG 24 hr tablet Take 150 mg by mouth at bedtime.     RELISTOR 150  MG TABS Take 150 mg by mouth in the morning, at noon, and at bedtime.  2   rOPINIRole (REQUIP) 0.25 MG tablet Take 1 tablet by mouth at bedtime.     Semaglutide,0.25 or 0.5MG/DOS, (OZEMPIC, 0.25 OR 0.5 MG/DOSE,) 2 MG/1.5ML SOPN Inject 0.25 mg into the skin every Friday.     SIMPONI 50 MG/0.5ML SOAJ Inject 50 mg into the skin every 30 (thirty) days.     triamterene-hydrochlorothiazide (MAXZIDE-25) 37.5-25 MG per tablet Take 1 each (1 tablet total) by mouth daily. 30 tablet 0   No current facility-administered medications for this visit.    PAST MEDICAL HISTORY: Past Medical History:  Diagnosis Date   Anxiety    Bipolar disorder ([place])    "vs ADHD" (09/26/2016)   Candida esophagitis ([facility])    Chronic lower back pain    Depression    Esophagitis    Fibromyalgia    Gastroparesis    Headache    "q 4 months now" (09/26/2016)   Hypertension    Hypothyroidism    IBS (irritable bowel syndrome)    Migraine    "maybe 1-2/year" (09/26/2016)   Occasional tremors    pt reports hx of hands shaking per pt that come and go   OSA on CPAP    Panic disorder    Pre-diabetes    Rheumatoid arthritis ([place])     "all over" (09/26/2016)   Spinal stenosis    Thyroid disease    hypothyroidism    PAST SURGICAL HISTORY: Past Surgical History:  Procedure Laterality Date   ABDOMINAL HERNIA REPAIR  02/01/2011   [facility]   BREAST BIOPSY Left    15+ years ago no visual scars, 2x   CARDIAC CATHETERIZATION  2012   DILATION AND CURETTAGE OF UTERUS     KNEE ARTHROSCOPY Left 01/24/2006   hx/notes 10/17/2010   LAPAROSCOPIC CHOLECYSTECTOMY  05/2001   hx/notes 10/17/2010   LIPOMA EXCISION Right 12/2001   thigh, hx/notes 10/17/2010   LUMBAR DISC SURGERY  1990s   POSTERIOR LUMBAR FUSION  12/2008   "L4, L5, S1"   TONSILLECTOMY     TOTAL KNEE ARTHROPLASTY Left 09/26/2016   Procedure: LEFT TOTAL KNEE ARTHROPLASTY;  Surgeon: Ninetta Lights, MD;  Location: [facility];  Service: Orthopedics;  Laterality: Left;   TOTAL KNEE ARTHROPLASTY Right 01/24/2021   Procedure: TOTAL KNEE ARTHROPLASTY;  Surgeon: Marchia Bond, MD;  Location[facility];  Service: Orthopedics;  Laterality: Right;   TUBAL LIGATION      FAMILY HISTORY: Family History  Problem Relation Age of Onset   Leukemia Mother 48   Hypertension Father    Heart disease Father    Cirrhosis Father    Crohn's disease Father    Heart disease Brother        pacemaker   Peripheral Artery Disease Brother    Hypertension Brother    Heart disease Maternal Grandmother    Diabetes Brother    Diabetes Maternal Grandfather    Colon cancer Neg Hx     SOCIAL HISTORY: Social History   Socioeconomic History   Marital status: Widowed    Spouse name: Not on file   Number of children: 1   Years of education: Not on file   Highest education level: Some college, no degree  Occupational History   Occupation: diabled  Tobacco Use   Smoking status: Former    Packs/day: 1.00    Years: 15.00    Total pack years: 15.00    Types: Cigarettes  Quit date: 08/08/1998    Years since quitting: 23.9   Smokeless tobacco: Never  Vaping Use   Vaping Use: Never used  Substance and  Sexual Activity   Alcohol use: Not Currently   Drug use: Never   Sexual activity: Not Currently  Other Topics Concern   Not on file  Social History Narrative   Patient is right-handed. She lives alone in a one level home. She drinks 1/2 cup of coffee a day. She exercises occasionally on a stationary bike at home.   Social Determinants of Health   Financial Resource Strain: Not on file  Food Insecurity: Not on file  Transportation Needs: Not on file  Physical Activity: Not on file  Stress: Not on file  Social Connections: Not on file  Intimate Partner Violence: Not on file      Marcial Pacas, M.D. Ph.D.  [facility] [address], [place], [place] [address] 7" yes Ph: [phone] Fax: [phone]  CC:  Vernie Shanks, MD No address on file  Vernie Shanks, MD (Inactive)

## 2022-07-24 LAB — VITAMIN B12: Vitamin B-12: 1662 pg/mL — ABNORMAL HIGH (ref 232–1245)

## 2022-07-24 LAB — HGB A1C W/O EAG: Hgb A1c MFr Bld: 5.3 % (ref 4.8–5.6)

## 2022-07-24 LAB — FERRITIN: Ferritin: 71 ng/mL (ref 15–150)

## 2022-07-27 ENCOUNTER — Ambulatory Visit: Payer: Medicare Other | Admitting: Nurse Practitioner

## 2022-07-31 IMAGING — CR DG LUMBAR SPINE COMPLETE 4+V
5 series · 5 of 5 positions shown · non-contrast
Comparison: [DATE] [DATE], [DATE].  [DATE] [DATE], [DATE].

CLINICAL DATA: Chronic low back pain after fall several months ago.

EXAM:
LUMBAR SPINE - COMPLETE 4+ VIEW

[w lumbar spine ap]
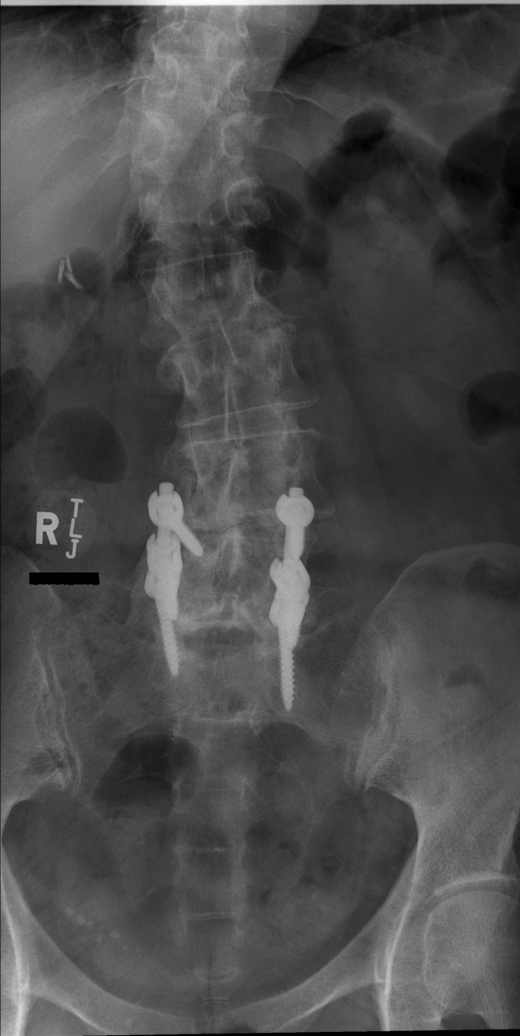

[w lumbar spine obl (1 of 2)]
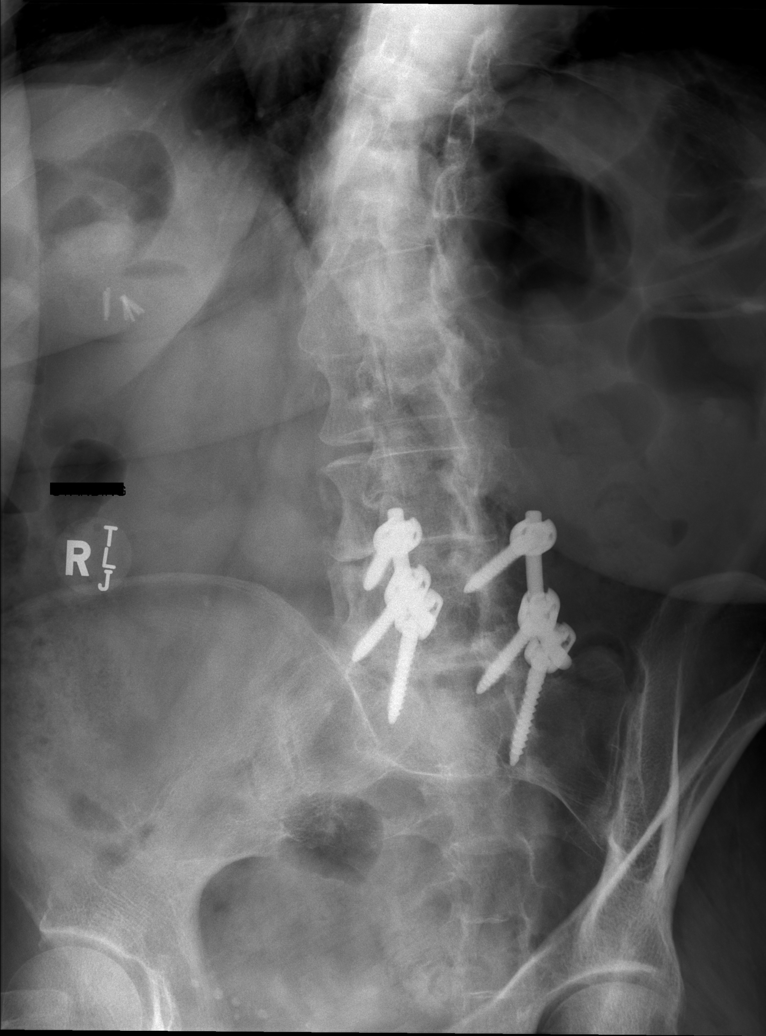

[w lumbar spine obl (2 of 2)]
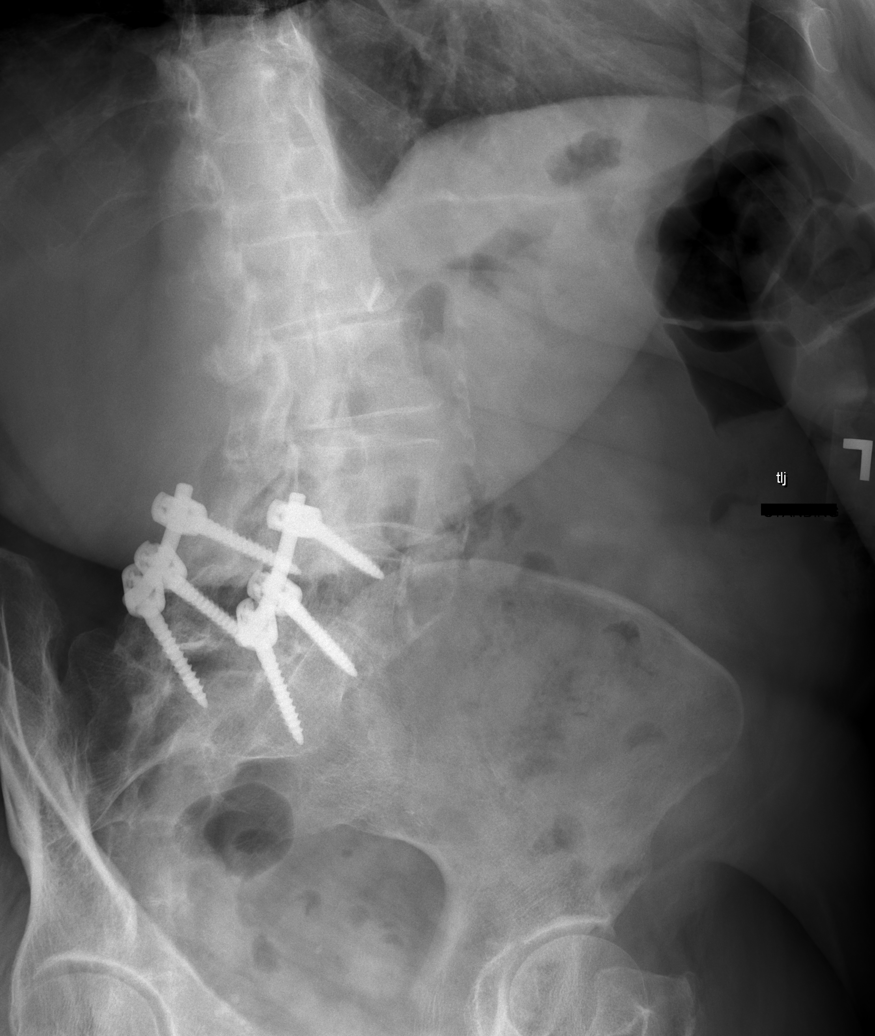

[w lumbar spine lat]
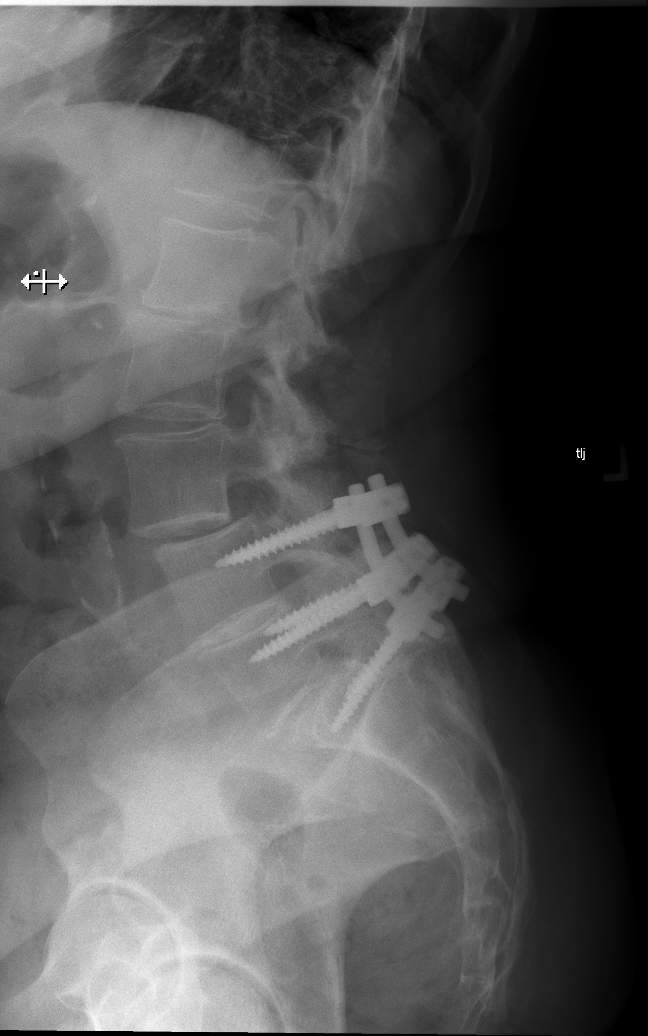

[w lumbar l-5 s-1 spot]
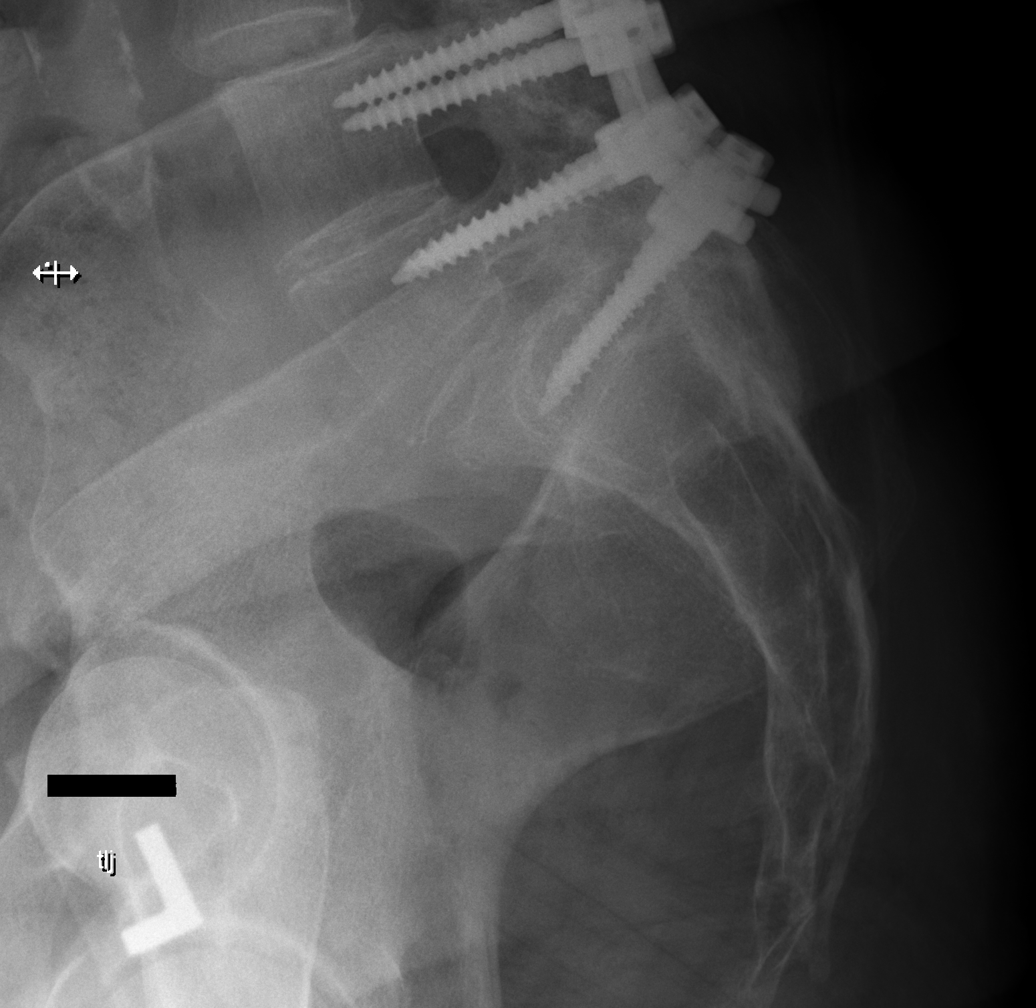

[5 of 5 positions shown; findings below may reference images not displayed]

FINDINGS: Mild S shaped scoliosis of lumbar spine is noted. Status post
surgical posterior fusion of L4-5 and L5-S1. Mild grade 1
anterolisthesis of L3-4 is noted secondary to posterior facet joint
hypertrophy. Severe degenerative disc disease is noted at L1-2. No
fracture is noted.
IMPRESSION: Degenerative and postsurgical changes as described above. No acute
abnormality seen.

## 2022-08-14 DIAGNOSIS — M069 Rheumatoid arthritis, unspecified: Secondary | ICD-10-CM | POA: Diagnosis not present

## 2022-08-14 DIAGNOSIS — M47817 Spondylosis without myelopathy or radiculopathy, lumbosacral region: Secondary | ICD-10-CM | POA: Diagnosis not present

## 2022-08-14 DIAGNOSIS — G894 Chronic pain syndrome: Secondary | ICD-10-CM | POA: Diagnosis not present

## 2022-08-24 ENCOUNTER — Telehealth: Payer: Self-pay

## 2022-08-24 NOTE — Patient Outreach (Signed)
  Care Coordination   08/24/2022 Name: Christine Robles MRN: [id number] DOB: February 21, 1956   Care Coordination Outreach Attempts:  An unsuccessful telephone outreach was attempted today to offer the patient information about available care coordination services as a benefit of their health plan.   Follow Up Plan:  Additional outreach attempts will be made to offer the patient care coordination information and services.   Encounter Outcome:  No Answer   Care Coordination Interventions:  No, not indicated    SIG Peter Garter RN, BSN,CCM, CDE Care Management Coordinator [facility] [phone]

## 2022-08-29 DIAGNOSIS — M0579 Rheumatoid arthritis with rheumatoid factor of multiple sites without organ or systems involvement: Secondary | ICD-10-CM | POA: Diagnosis not present

## 2022-08-29 DIAGNOSIS — M159 Polyosteoarthritis, unspecified: Secondary | ICD-10-CM | POA: Diagnosis not present

## 2022-08-29 DIAGNOSIS — Z79899 Other long term (current) drug therapy: Secondary | ICD-10-CM | POA: Diagnosis not present

## 2022-09-07 ENCOUNTER — Telehealth: Payer: Self-pay

## 2022-09-07 NOTE — Patient Outreach (Signed)
  Care Coordination   Initial Visit Note   09/07/2022 Name: Christine Robles MRN: [id number] DOB: 1955/11/21  Christine Robles is a 67 y.o. year old female who sees Christine Poling, DO for primary care. I spoke with  Christine Robles by phone today.  What matters to the patients health and wellness today?  No concerns today    Goals Addressed             This Visit's Progress    COMPLETED: Care Coordination Activities- No Follow Up Required       Interventions Today    Flowsheet Row Most Recent Value  General Interventions   General Interventions Discussed/Reviewed General Interventions Discussed, Doctor Visits  [Declines needing any further RNCM interventions]  Doctor Visits Discussed/Reviewed Doctor Visits Discussed, Annual Wellness Visits, PCP, Specialist  PCP/Specialist Visits Compliance with follow-up visit  Education Interventions   Education Provided Provided Education  Provided Verbal Education On When to see the doctor, Other  [care coordination services]  Pharmacy Interventions   Pharmacy Dicussed/Reviewed Pharmacy Topics Discussed, Medications and their functions              SDOH assessments and interventions completed:  Yes  SDOH Interventions Today    Flowsheet Row Most Recent Value  SDOH Interventions   Food Insecurity Interventions Intervention Not Indicated  Housing Interventions Intervention Not Indicated  Transportation Interventions Intervention Not Indicated  Utilities Interventions Intervention Not Indicated        Care Coordination Interventions:  Yes, provided   Follow up plan: No further intervention required.   Encounter Outcome:  Pt. Visit Completed  Dudley Major RN, BSN,CCM, CDE Care Management Coordinator [facility] Care Management [phone]

## 2022-09-07 NOTE — Patient Outreach (Signed)
  Care Coordination   09/07/2022 Name: Christine Robles MRN: [id number] DOB: Mar 06, 1956   Care Coordination Outreach Attempts:  A second unsuccessful outreach was attempted today to offer the patient with information about available care coordination services as a benefit of their health plan.     Follow Up Plan:  Additional outreach attempts will be made to offer the patient care coordination information and services.   Encounter Outcome:  No Answer   Care Coordination Interventions:  No, not indicated    SIG  Dudley Major RN, BSN,CCM, CDE Care Management Coordinator [facility] Care Management [phone]

## 2022-09-07 NOTE — Patient Instructions (Signed)
Visit Information  Thank you for taking time to visit with me today. Please don't hesitate to contact me if I can be of assistance to you.   Following are the goals we discussed today:   Goals Addressed             This Visit's Progress    COMPLETED: Care Coordination Activities- No Follow Up Required       Interventions Today    Flowsheet Row Most Recent Value  General Interventions   General Interventions Discussed/Reviewed General Interventions Discussed, Doctor Visits  [Declines needing any further RNCM interventions]  Doctor Visits Discussed/Reviewed Doctor Visits Discussed, Annual Wellness Visits, PCP, Specialist  PCP/Specialist Visits Compliance with follow-up visit  Education Interventions   Education Provided Provided Education  Provided Verbal Education On When to see the doctor, Other  [care coordination services]  Pharmacy Interventions   Pharmacy Dicussed/Reviewed Pharmacy Topics Discussed, Medications and their functions                If you are experiencing a Mental Health or Behavioral Health Crisis or need someone to talk to, please call the Suicide and Crisis Lifeline: 988 call the [place] National Suicide Prevention Lifeline: (386) 137-7688 or TTY: (680)814-5339 TTY (313) 264-8957) to talk to a trained counselor call 1-800-273-TALK (toll free, 24 hour hotline) go to [facility] [address] [place] [phone]) call 911   Patient verbalizes understanding of instructions and care plan provided today and agrees to view in MyChart. Active MyChart status and patient understanding of how to access instructions and care plan via MyChart confirmed with patient.     No further follow up required:    SIGNATURE Dudley Major RN, Maximiano Coss, CDE Care Management Coordinator [facility] Care Management [phone]

## 2022-09-09 DIAGNOSIS — E785 Hyperlipidemia, unspecified: Secondary | ICD-10-CM | POA: Diagnosis not present

## 2022-09-09 DIAGNOSIS — M069 Rheumatoid arthritis, unspecified: Secondary | ICD-10-CM | POA: Diagnosis not present

## 2022-09-09 DIAGNOSIS — I10 Essential (primary) hypertension: Secondary | ICD-10-CM | POA: Diagnosis not present

## 2022-09-09 DIAGNOSIS — G8929 Other chronic pain: Secondary | ICD-10-CM | POA: Diagnosis not present

## 2022-09-09 DIAGNOSIS — M1711 Unilateral primary osteoarthritis, right knee: Secondary | ICD-10-CM | POA: Diagnosis not present

## 2022-09-09 DIAGNOSIS — E1169 Type 2 diabetes mellitus with other specified complication: Secondary | ICD-10-CM | POA: Diagnosis not present

## 2022-09-09 DIAGNOSIS — K219 Gastro-esophageal reflux disease without esophagitis: Secondary | ICD-10-CM | POA: Diagnosis not present

## 2022-09-09 DIAGNOSIS — E039 Hypothyroidism, unspecified: Secondary | ICD-10-CM | POA: Diagnosis not present

## 2022-09-18 DIAGNOSIS — G894 Chronic pain syndrome: Secondary | ICD-10-CM | POA: Diagnosis not present

## 2022-09-18 DIAGNOSIS — M47817 Spondylosis without myelopathy or radiculopathy, lumbosacral region: Secondary | ICD-10-CM | POA: Diagnosis not present

## 2022-09-18 DIAGNOSIS — M069 Rheumatoid arthritis, unspecified: Secondary | ICD-10-CM | POA: Diagnosis not present

## 2022-10-16 DIAGNOSIS — M069 Rheumatoid arthritis, unspecified: Secondary | ICD-10-CM | POA: Diagnosis not present

## 2022-10-16 DIAGNOSIS — Z79891 Long term (current) use of opiate analgesic: Secondary | ICD-10-CM | POA: Diagnosis not present

## 2022-10-16 DIAGNOSIS — M47817 Spondylosis without myelopathy or radiculopathy, lumbosacral region: Secondary | ICD-10-CM | POA: Diagnosis not present

## 2022-10-16 DIAGNOSIS — G894 Chronic pain syndrome: Secondary | ICD-10-CM | POA: Diagnosis not present

## 2022-11-02 DIAGNOSIS — M069 Rheumatoid arthritis, unspecified: Secondary | ICD-10-CM | POA: Diagnosis not present

## 2022-11-02 DIAGNOSIS — E785 Hyperlipidemia, unspecified: Secondary | ICD-10-CM | POA: Diagnosis not present

## 2022-11-02 DIAGNOSIS — G43109 Migraine with aura, not intractable, without status migrainosus: Secondary | ICD-10-CM | POA: Diagnosis not present

## 2022-11-02 DIAGNOSIS — E1169 Type 2 diabetes mellitus with other specified complication: Secondary | ICD-10-CM | POA: Diagnosis not present

## 2022-11-02 DIAGNOSIS — M1711 Unilateral primary osteoarthritis, right knee: Secondary | ICD-10-CM | POA: Diagnosis not present

## 2022-11-02 DIAGNOSIS — I10 Essential (primary) hypertension: Secondary | ICD-10-CM | POA: Diagnosis not present

## 2022-11-02 DIAGNOSIS — E039 Hypothyroidism, unspecified: Secondary | ICD-10-CM | POA: Diagnosis not present

## 2022-11-02 DIAGNOSIS — G8929 Other chronic pain: Secondary | ICD-10-CM | POA: Diagnosis not present

## 2022-11-05 DIAGNOSIS — M545 Low back pain, unspecified: Secondary | ICD-10-CM | POA: Diagnosis not present

## 2022-11-13 DIAGNOSIS — G894 Chronic pain syndrome: Secondary | ICD-10-CM | POA: Diagnosis not present

## 2022-11-13 DIAGNOSIS — M47817 Spondylosis without myelopathy or radiculopathy, lumbosacral region: Secondary | ICD-10-CM | POA: Diagnosis not present

## 2022-11-13 DIAGNOSIS — M069 Rheumatoid arthritis, unspecified: Secondary | ICD-10-CM | POA: Diagnosis not present

## 2022-11-26 DIAGNOSIS — Z0000 Encounter for general adult medical examination without abnormal findings: Secondary | ICD-10-CM | POA: Diagnosis not present

## 2022-11-30 ENCOUNTER — Other Ambulatory Visit: Payer: Self-pay | Admitting: Family Medicine

## 2022-11-30 DIAGNOSIS — Z1231 Encounter for screening mammogram for malignant neoplasm of breast: Secondary | ICD-10-CM

## 2022-12-04 DIAGNOSIS — M47817 Spondylosis without myelopathy or radiculopathy, lumbosacral region: Secondary | ICD-10-CM | POA: Diagnosis not present

## 2022-12-04 DIAGNOSIS — G894 Chronic pain syndrome: Secondary | ICD-10-CM | POA: Diagnosis not present

## 2022-12-04 DIAGNOSIS — M069 Rheumatoid arthritis, unspecified: Secondary | ICD-10-CM | POA: Diagnosis not present

## 2022-12-07 DIAGNOSIS — H02055 Trichiasis without entropian left lower eyelid: Secondary | ICD-10-CM | POA: Diagnosis not present

## 2022-12-20 DIAGNOSIS — E039 Hypothyroidism, unspecified: Secondary | ICD-10-CM | POA: Diagnosis not present

## 2022-12-20 DIAGNOSIS — M069 Rheumatoid arthritis, unspecified: Secondary | ICD-10-CM | POA: Diagnosis not present

## 2022-12-20 DIAGNOSIS — Z7969 Long term (current) use of other immunomodulators and immunosuppressants: Secondary | ICD-10-CM | POA: Diagnosis not present

## 2022-12-20 DIAGNOSIS — E559 Vitamin D deficiency, unspecified: Secondary | ICD-10-CM | POA: Diagnosis not present

## 2022-12-20 DIAGNOSIS — D84821 Immunodeficiency due to drugs: Secondary | ICD-10-CM | POA: Diagnosis not present

## 2022-12-20 DIAGNOSIS — R296 Repeated falls: Secondary | ICD-10-CM | POA: Diagnosis not present

## 2022-12-20 DIAGNOSIS — E7800 Pure hypercholesterolemia, unspecified: Secondary | ICD-10-CM | POA: Diagnosis not present

## 2022-12-20 DIAGNOSIS — E119 Type 2 diabetes mellitus without complications: Secondary | ICD-10-CM | POA: Diagnosis not present

## 2022-12-24 DIAGNOSIS — M47816 Spondylosis without myelopathy or radiculopathy, lumbar region: Secondary | ICD-10-CM | POA: Diagnosis not present

## 2022-12-25 DIAGNOSIS — H35369 Drusen (degenerative) of macula, unspecified eye: Secondary | ICD-10-CM | POA: Diagnosis not present

## 2022-12-25 DIAGNOSIS — H2513 Age-related nuclear cataract, bilateral: Secondary | ICD-10-CM | POA: Diagnosis not present

## 2023-01-07 DIAGNOSIS — M069 Rheumatoid arthritis, unspecified: Secondary | ICD-10-CM | POA: Diagnosis not present

## 2023-01-07 DIAGNOSIS — M47817 Spondylosis without myelopathy or radiculopathy, lumbosacral region: Secondary | ICD-10-CM | POA: Diagnosis not present

## 2023-01-07 DIAGNOSIS — G894 Chronic pain syndrome: Secondary | ICD-10-CM | POA: Diagnosis not present

## 2023-01-11 ENCOUNTER — Other Ambulatory Visit: Payer: Self-pay

## 2023-01-11 ENCOUNTER — Encounter (HOSPITAL_COMMUNITY): Payer: Self-pay

## 2023-01-11 ENCOUNTER — Emergency Department (HOSPITAL_COMMUNITY)
Admission: EM | Admit: 2023-01-11 | Discharge: 2023-01-11 | Disposition: A | Discharge status: Home / Self Care | Payer: Medicare Other | Attending: Emergency Medicine | Admitting: Emergency Medicine

## 2023-01-11 ENCOUNTER — Emergency Department (HOSPITAL_COMMUNITY): Payer: Medicare Other

## 2023-01-11 DIAGNOSIS — S0003XA Contusion of scalp, initial encounter: Secondary | ICD-10-CM | POA: Diagnosis not present

## 2023-01-11 DIAGNOSIS — R58 Hemorrhage, not elsewhere classified: Secondary | ICD-10-CM | POA: Diagnosis not present

## 2023-01-11 DIAGNOSIS — E871 Hypo-osmolality and hyponatremia: Secondary | ICD-10-CM | POA: Diagnosis not present

## 2023-01-11 DIAGNOSIS — S199XXA Unspecified injury of neck, initial encounter: Secondary | ICD-10-CM | POA: Diagnosis not present

## 2023-01-11 DIAGNOSIS — W1830XA Fall on same level, unspecified, initial encounter: Secondary | ICD-10-CM | POA: Diagnosis not present

## 2023-01-11 DIAGNOSIS — W19XXXA Unspecified fall, initial encounter: Secondary | ICD-10-CM | POA: Diagnosis not present

## 2023-01-11 DIAGNOSIS — S0990XA Unspecified injury of head, initial encounter: Secondary | ICD-10-CM | POA: Diagnosis not present

## 2023-01-11 DIAGNOSIS — I6782 Cerebral ischemia: Secondary | ICD-10-CM | POA: Diagnosis not present

## 2023-01-11 LAB — CBC WITH DIFFERENTIAL/PLATELET
Abs Immature Granulocytes: 0.05 10*3/uL (ref 0.00–0.07)
Basophils Absolute: 0.1 10*3/uL (ref 0.0–0.1)
Basophils Relative: 1 %
Eosinophils Absolute: 0.1 10*3/uL (ref 0.0–0.5)
Eosinophils Relative: 1 %
HCT: 39.9 % (ref 36.0–46.0)
Hemoglobin: 13.6 g/dL (ref 12.0–15.0)
Immature Granulocytes: 1 %
Lymphocytes Relative: 15 %
Lymphs Abs: 1.3 10*3/uL (ref 0.7–4.0)
MCH: 30 pg (ref 26.0–34.0)
MCHC: 34.1 g/dL (ref 30.0–36.0)
MCV: 87.9 fL (ref 80.0–100.0)
Monocytes Absolute: 0.5 10*3/uL (ref 0.1–1.0)
Monocytes Relative: 6 %
Neutro Abs: 6.9 10*3/uL (ref 1.7–7.7)
Neutrophils Relative %: 76 %
Platelets: 425 10*3/uL — ABNORMAL HIGH (ref 150–400)
RBC: 4.54 MIL/uL (ref 3.87–5.11)
RDW: 12.6 % (ref 11.5–15.5)
WBC: 9 10*3/uL (ref 4.0–10.5)
nRBC: 0 % (ref 0.0–0.2)

## 2023-01-11 LAB — COMPREHENSIVE METABOLIC PANEL
ALT: 19 U/L (ref 0–44)
AST: 19 U/L (ref 15–41)
Albumin: 3.7 g/dL (ref 3.5–5.0)
Alkaline Phosphatase: 86 U/L (ref 38–126)
Anion gap: 14 (ref 5–15)
BUN: 12 mg/dL (ref 8–23)
CO2: 22 mmol/L (ref 22–32)
Calcium: 9.5 mg/dL (ref 8.9–10.3)
Chloride: 90 mmol/L — ABNORMAL LOW (ref 98–111)
Creatinine, Ser: 1.2 mg/dL — ABNORMAL HIGH (ref 0.44–1.00)
GFR, Estimated: 49 mL/min — ABNORMAL LOW (ref >60)
Glucose, Bld: 99 mg/dL (ref 70–99)
Potassium: 3.5 mmol/L (ref 3.5–5.1)
Sodium: 126 mmol/L — ABNORMAL LOW (ref 135–145)
Total Bilirubin: 0.4 mg/dL (ref 0.3–1.2)
Total Protein: 6.3 g/dL — ABNORMAL LOW (ref 6.5–8.1)

## 2023-01-11 MED ORDER — SODIUM CHLORIDE 0.9 % IV BOLUS
1000.0000 mL | Freq: Once | INTRAVENOUS | Status: AC
  Administered 2023-01-11: 1000 mL via INTRAVENOUS

## 2023-01-11 MED ORDER — FENTANYL CITRATE PF 50 MCG/ML IJ SOSY
50.0000 ug | PREFILLED_SYRINGE | Freq: Once | INTRAMUSCULAR | Status: AC
  Administered 2023-01-11: 50 ug via INTRAVENOUS
  Filled 2023-01-11: qty 1

## 2023-01-11 MED ORDER — LIDOCAINE HCL 2 % IJ SOLN
10.0000 mL | Freq: Once | INTRAMUSCULAR | Status: AC
  Administered 2023-01-11: 200 mg via INTRADERMAL
  Filled 2023-01-11: qty 20

## 2023-01-11 MED ORDER — ONDANSETRON 4 MG PO TBDP: ORAL_TABLET | ORAL | 0 refills | Status: AC

## 2023-01-11 MED ORDER — ONDANSETRON HCL 4 MG/2ML IJ SOLN
4.0000 mg | Freq: Once | INTRAMUSCULAR | Status: AC
  Administered 2023-01-11: 4 mg via INTRAVENOUS
  Filled 2023-01-11: qty 2
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
## 2023-01-11 NOTE — ED Provider Notes (Signed)
[facility]MENT AT [facility] Provider Note   CSN: [id number] Arrival date & time: 01/11/23  1847     History  Chief Complaint  Patient presents with   Marletta Lor    Gearl Kinghorn is a 67 y.o. female history of restless leg syndrome, depression, chronic pain here presenting with fall.  Patient states that she lost her balance and fell backwards and hit her head.  Patient states that she has a laceration on the posterior scalp area.  She states that her tetanus is up-to-date.  The history is provided by the patient.       Home Medications Prior to Admission medications   Medication Sig Start Date End Date Taking? Authorizing Provider  ALPRAZolam Prudy Feeler) 1 MG tablet Take 0.5-1 mg by mouth 2 (two) times daily as needed for anxiety.    [provider]  amLODipine (NORVASC) 5 MG tablet Take 5 mg by mouth in the morning. 09/05/20   [provider]  atorvastatin (LIPITOR) 20 MG tablet Take 20 mg by mouth daily.    [provider]  buPROPion ER (WELLBUTRIN SR) 100 MG 12 hr tablet Take 100 mg by mouth in the morning.    [provider]  Cholecalciferol (VITAMIN D3) 50000 units CAPS Take 50,000 Units by mouth every Thursday. 02/11/18   [provider]  FLUoxetine (PROZAC) 20 MG capsule Take 20 mg by mouth daily with breakfast.    [provider]  gabapentin (NEURONTIN) 100 MG capsule Take 1 capsule (100 mg total) by mouth 3 (three) times daily. 07/23/22   Levert Feinstein, MD  lamoTRIgine (LAMICTAL) 200 MG tablet Take 200 mg by mouth in the morning. 12/09/17   [provider]  Levomefolate Glucosamine (METHYLFOLATE PO) Take 15 mg by mouth in the morning.    [provider]  levothyroxine (SYNTHROID, LEVOTHROID) 112 MCG tablet Take 112 mcg by mouth daily before breakfast. 02/25/18   [provider]  Melatonin 10 MG TABS Take 10 mg by mouth at bedtime.    [provider]  meloxicam (MOBIC)  15 MG tablet Take 15 mg by mouth daily.    [provider]  metoCLOPramide (REGLAN) 10 MG tablet Take 1 tablet (10 mg total) by mouth every 8 (eight) hours as needed for nausea. 03/25/18   Rozier, Winfield Rast, MD  Multiple Vitamins-Minerals (MULTIVITAMIN ADULTS 50+) TABS Take by mouth.    [provider]  ondansetron (ZOFRAN-ODT) 8 MG disintegrating tablet 8mg  ODT q4 hours prn nausea 05/06/21   Geoffery Lyons, MD  oxyCODONE-acetaminophen (PERCOCET) 7.5-325 MG tablet Take 1 tablet by mouth every 4 (four) hours as needed. 07/16/22   [provider]  pantoprazole (PROTONIX) 40 MG tablet TAKE 1 TABLET BY MOUTH TWICE DAILY Patient taking differently: Take 40 mg by mouth daily. 09/20/14   Hart Carwin, MD  Polyethyl Glycol-Propyl Glycol 0.4-0.3 % SOLN Place 1-2 drops into both eyes 3 (three) times daily as needed (dry/irritated eyes).    [provider]  QUEtiapine Fumarate (SEROQUEL XR) 150 MG 24 hr tablet Take 150 mg by mouth at bedtime. 06/11/22   [provider]  RELISTOR 150 MG TABS Take 150 mg by mouth in the morning, at noon, and at bedtime. 03/16/18   [provider]  rOPINIRole (REQUIP) 0.25 MG tablet Take 1 tablet by mouth at bedtime.    [provider]  Semaglutide,0.25 or 0.5MG /DOS, (OZEMPIC, 0.25 OR 0.5 MG/DOSE,) 2 MG/1.5ML SOPN Inject 0.25 mg into the  skin every Friday. 10/27/20   [provider]  SIMPONI 50 MG/0.5ML SOAJ Inject 50 mg into the skin every 30 (thirty) days. 12/25/20   [provider]  triamterene-hydrochlorothiazide (MAXZIDE-25) 37.5-25 MG per tablet Take 1 each (1 tablet total) by mouth daily. 08/14/12   Charm Rings, NP      Allergies    Escitalopram oxalate, Gabapentin, Buspirone hcl, Fentanyl, and Morphine and codeine    Review of Systems   Review of Systems  Skin:  Positive for wound.  All other systems reviewed and are negative.   Physical Exam Updated Vital Signs BP (!) 145/84   Pulse  91   Temp 99.4 F (37.4 C) (Oral)   Resp (!) 21   Ht 5\' 6"  (1.676 m)   Wt 86.2 kg   LMP 08/08/1994   SpO2 97%   BMI 30.67 kg/m  Physical Exam Vitals and nursing note reviewed.  Constitutional:      Appearance: Normal appearance.  HENT:     Head:     Comments: Patient has left parietal scalp contusion.  No obvious laceration    Nose: Nose normal.     Mouth/Throat:     Mouth: Mucous membranes are moist.  Eyes:     Extraocular Movements: Extraocular movements intact.     Pupils: Pupils are equal, round, and reactive to light.  Neck:     Comments: C collar in place  Cardiovascular:     Rate and Rhythm: Normal rate.     Pulses: Normal pulses.     Heart sounds: Normal heart sounds.  Pulmonary:     Effort: Pulmonary effort is normal.     Breath sounds: Normal breath sounds.  Abdominal:     General: Abdomen is flat.     Palpations: Abdomen is soft.  Musculoskeletal:     Cervical back: Normal range of motion.     Comments: No obvious extremity trauma.  No spinal tenderness  Skin:    General: Skin is warm.     Capillary Refill: Capillary refill takes less than 2 seconds.  Neurological:     General: No focal deficit present.     Mental Status: She is alert and oriented to person, place, and time.  Psychiatric:        Mood and Affect: Mood normal.        Behavior: Behavior normal.     ED Results / Procedures / Treatments   Labs (all labs ordered are listed, but only abnormal results are displayed) Labs Reviewed  CBC WITH DIFFERENTIAL/PLATELET  COMPREHENSIVE METABOLIC PANEL    EKG None  Radiology No results found.  Procedures Procedures    Medications Ordered in ED Medications  sodium chloride 0.9 % bolus 1,000 mL (1,000 mLs Intravenous New Bag/Given 01/11/23 2052)  ondansetron (ZOFRAN) injection 4 mg (4 mg Intravenous Given 01/11/23 2053)  fentaNYL (SUBLIMAZE) injection 50 mcg (50 mcg Intravenous Given 01/11/23 2052)  lidocaine (XYLOCAINE) 2 % (with pres)  injection 200 mg (200 mg Intradermal Given 01/11/23 2052)    ED Course/ Medical Decision Making/ A&P                                 Medical Decision Making Rein Reining is a 67 y.o. female here presenting with follow-up with scalp contusion.  Will do CT head and cervical spine and get  CBC and CMP.  Will give pain meds and reassess  10:13 PM I reviewed patient's labs and sodium is 126.  Baseline is 134.  Patient states that she is dehydrated and did not eat much today.  Hyponatremia likely from dehydration.  Patient received IV fluids and felt better.  CT head and cervical spine unremarkable.  I was able to clean up her wound and there is no laceration.   Problems Addressed: Contusion of scalp, initial encounter: acute illness or injury Hyponatremia: acute illness or injury  Amount and/or Complexity of Data Reviewed Labs: ordered. Decision-making details documented in ED Course. Radiology: ordered and independent interpretation performed. Decision-making details documented in ED Course.  Risk Prescription drug management.   sion(s) / ED Diagnoses Final diagnoses:  None    Rx / DC Orders ED Discharge Orders     None         Charlynne Pander, MD 01/11/23 2224

## 2023-01-11 NOTE — Discharge Instructions (Addendum)
As we discussed your CT scan did not show any fractures or bleeding.  Your sodium level is low likely from dehydration.  Please stay hydrated.  You need to have your sodium level rechecked with your doctor in a week  You are on chronic pain medicine so please continue taking pain medicine as prescribed by your doctor.  If you need more pain medicine you need to talk to your doctor about it  Return to ER if you have another fall, weakness, dehydration

## 2023-01-18 DIAGNOSIS — R531 Weakness: Secondary | ICD-10-CM | POA: Diagnosis not present

## 2023-01-18 DIAGNOSIS — E871 Hypo-osmolality and hyponatremia: Secondary | ICD-10-CM | POA: Diagnosis not present

## 2023-01-23 DIAGNOSIS — E871 Hypo-osmolality and hyponatremia: Secondary | ICD-10-CM | POA: Diagnosis not present

## 2023-02-05 DIAGNOSIS — M069 Rheumatoid arthritis, unspecified: Secondary | ICD-10-CM | POA: Diagnosis not present

## 2023-02-05 DIAGNOSIS — G894 Chronic pain syndrome: Secondary | ICD-10-CM | POA: Diagnosis not present

## 2023-02-05 DIAGNOSIS — M47817 Spondylosis without myelopathy or radiculopathy, lumbosacral region: Secondary | ICD-10-CM | POA: Diagnosis not present

## 2023-02-10 DIAGNOSIS — R52 Pain, unspecified: Secondary | ICD-10-CM | POA: Diagnosis not present

## 2023-02-10 DIAGNOSIS — Z03818 Encounter for observation for suspected exposure to other biological agents ruled out: Secondary | ICD-10-CM | POA: Diagnosis not present

## 2023-02-10 DIAGNOSIS — R0981 Nasal congestion: Secondary | ICD-10-CM | POA: Diagnosis not present

## 2023-02-10 DIAGNOSIS — R051 Acute cough: Secondary | ICD-10-CM | POA: Diagnosis not present

## 2023-02-15 DIAGNOSIS — J069 Acute upper respiratory infection, unspecified: Secondary | ICD-10-CM | POA: Diagnosis not present

## 2023-03-05 ENCOUNTER — Ambulatory Visit (AMBULATORY_SURGERY_CENTER): Payer: Medicare Other | Admitting: *Deleted

## 2023-03-05 VITALS — Ht 66.0 in | Wt 198.0 lb

## 2023-03-05 DIAGNOSIS — Z85038 Personal history of other malignant neoplasm of large intestine: Secondary | ICD-10-CM

## 2023-03-05 DIAGNOSIS — Z860100 Personal history of colon polyps, unspecified: Secondary | ICD-10-CM

## 2023-03-05 MED ORDER — NA SULFATE-K SULFATE-MG SULF 17.5-3.13-1.6 GM/177ML PO SOLN: 1.0000 | Freq: Once | ORAL | 0 refills | Status: AC

## 2023-03-05 NOTE — Progress Notes (Signed)
Pt's name and DOB verified at the beginning of the pre-visit.  Pt denies any difficulty with ambulating,sitting, laying down or rolling side to side Gave both [facility] main # and MD on call # prior to instructions.  No egg or soy allergy known to patient  No issues known to pt with past sedation with any surgeries or procedures Pt denies having issues being intubated Pt has no issues moving head neck or swallowing No FH of Malignant Hyperthermia Pt is not on diet pills Pt is not on home 02  Pt is not on blood thinners  Pt denies issues with constipation  Pt is not on dialysis Pt denise any abnormal heart rhythms  Pt denies any upcoming cardiac testing Pt encouraged to use to use Singlecare or Goodrx to reduce cost  Patient's chart reviewed by Cathlyn Parsons CNRA prior to pre-visit and patient appropriate for the [facility].  Pre-visit completed and red dot placed by patient's name on their procedure day (on provider's schedule).  . Visit in person Pt states weight is  Instructed pt why it 198 lbed with pt and pt states understanding. Instructed to review again prior to procedure. Pt states they will.  Instructions given to pt with coupon and by my chart

## 2023-03-06 ENCOUNTER — Encounter: Payer: Self-pay | Admitting: Gastroenterology

## 2023-03-12 ENCOUNTER — Encounter: Payer: Self-pay | Admitting: Certified Registered Nurse Anesthetist

## 2023-03-12 DIAGNOSIS — H02052 Trichiasis without entropian right lower eyelid: Secondary | ICD-10-CM | POA: Diagnosis not present

## 2023-03-19 ENCOUNTER — Ambulatory Visit: Payer: Medicare Other | Admitting: Gastroenterology

## 2023-03-19 ENCOUNTER — Encounter: Payer: Self-pay | Admitting: Gastroenterology

## 2023-03-19 VITALS — BP 173/98 | HR 69 | Temp 96.8°F | Resp 13 | Ht 66.0 in | Wt 198.0 lb

## 2023-03-19 DIAGNOSIS — Z09 Encounter for follow-up examination after completed treatment for conditions other than malignant neoplasm: Secondary | ICD-10-CM | POA: Diagnosis not present

## 2023-03-19 DIAGNOSIS — Z860100 Personal history of colon polyps, unspecified: Secondary | ICD-10-CM | POA: Diagnosis not present

## 2023-03-19 DIAGNOSIS — G4733 Obstructive sleep apnea (adult) (pediatric): Secondary | ICD-10-CM | POA: Diagnosis not present

## 2023-03-19 DIAGNOSIS — D122 Benign neoplasm of ascending colon: Secondary | ICD-10-CM

## 2023-03-19 MED ORDER — SODIUM CHLORIDE 0.9 % IV SOLN: 500.0000 mL | Freq: Once | INTRAVENOUS | Status: DC

## 2023-03-19 NOTE — Patient Instructions (Signed)
   Handouts on polyps,diverticulosis,& hemorrhoids given to you today.   Await pathology results on polyps removed   Continue previous diet & medications    YOU HAD AN ENDOSCOPIC PROCEDURE TODAY AT THE [facility]:   Refer to the procedure report that was given to you for any specific questions about what was found during the examination.  If the procedure report does not answer your questions, please call your gastroenterologist to clarify.  If you requested that your care partner not be given the details of your procedure findings, then the procedure report has been included in a sealed envelope for you to review at your convenience later.  YOU SHOULD EXPECT: Some feelings of bloating in the abdomen. Passage of more gas than usual.  Walking can help get rid of the air that was put into your GI tract during the procedure and reduce the bloating. If you had a lower endoscopy (such as a colonoscopy or flexible sigmoidoscopy) you may notice spotting of blood in your stool or on the toilet paper. If you underwent a bowel prep for your procedure, you may not have a normal bowel movement for a few days.  Please Note:  You might notice some irritation and congestion in your nose or some drainage.  This is from the oxygen used during your procedure.  There is no need for concern and it should clear up in a day or so.  SYMPTOMS TO REPORT IMMEDIATELY:  Following lower endoscopy (colonoscopy or flexible sigmoidoscopy):  Excessive amounts of blood in the stool  Significant tenderness or worsening of abdominal pains  Swelling of the abdomen that is new, acute  Fever of 100F or higher   For urgent or emergent issues, a gastroenterologist can be reached at any hour by calling [phone]. Do not use MyChart messaging for urgent concerns.    DIET:  We do recommend a small meal at first, but then you may proceed to your regular diet.  Drink plenty of fluids but you should avoid alcoholic  beverages for 24 hours.  ACTIVITY:  You should plan to take it easy for the rest of today and you should NOT DRIVE or use heavy machinery until tomorrow (because of the sedation medicines used during the test).    FOLLOW UP: Our staff will call the number listed on your records the next business day following your procedure.  We will call around 7:15- 8:00 am to check on you and address any questions or concerns that you may have regarding the information given to you following your procedure. If we do not reach you, we will leave a message.     If any biopsies were taken you will be contacted by phone or by letter within the next 1-3 weeks.  Please call us at [phone] if you have not heard about the biopsies in 3 weeks.    SIGNATURES/CONFIDENTIALITY: You and/or your care partner have signed paperwork which will be entered into your electronic medical record.  These signatures attest to the fact that that the information above on your After Visit Summary has been reviewed and is understood.  Full responsibility of the confidentiality of this discharge information lies with you and/or your care-partner.

## 2023-03-19 NOTE — Progress Notes (Signed)
Report given to PACU, vss 

## 2023-03-19 NOTE — Op Note (Signed)
[facility] Patient Name: Sabian Sprow Procedure Date: 03/19/2023 11:51 AM MRN: [id number] Endoscopist: Napoleon Form , MD, [id number] Age: 67 Referring MD:  Date of Birth: [birth year] Gender: Female Account #: [id number] Procedure:                Colonoscopy Indications:              High risk colon cancer surveillance: Personal                            history of colonic polyps, High risk colon cancer                            surveillance: Personal history of multiple (3 or                            more) adenomas, High risk colon cancer                            surveillance: Personal history of adenoma less than                            10 mm in size Medicines:                Monitored Anesthesia Care Procedure:                Pre-Anesthesia Assessment:                           - Prior to the procedure, a History and Physical                            was performed, and patient medications and                            allergies were reviewed. The patient's tolerance of                            previous anesthesia was also reviewed. The risks                            and benefits of the procedure and the sedation                            options and risks were discussed with the patient.                            All questions were answered, and informed consent                            was obtained. Prior Anticoagulants: The patient has                            taken no anticoagulant or antiplatelet agents. ASA  Grade Assessment: II - A patient with mild systemic                            disease. After reviewing the risks and benefits,                            the patient was deemed in satisfactory condition to                            undergo the procedure.                           After obtaining informed consent, the colonoscope                            was passed under direct vision. Throughout the                             procedure, the patient's blood pressure, pulse, and                            oxygen saturations were monitored continuously. The                            [id number]-HQ190L Colonoscope [id number] was introduced                            through the anus and advanced to the the cecum,                            identified by appendiceal orifice and ileocecal                            valve. The colonoscopy was performed without                            difficulty. The patient tolerated the procedure                            well. The quality of the bowel preparation was                            adequate. The ileocecal valve, appendiceal orifice,                            and rectum were photographed. Scope In: 12:03:17 PM Scope Out: 12:19:20 PM Scope Withdrawal Time: 0 hours 10 minutes 45 seconds  Total Procedure Duration: 0 hours 16 minutes 3 seconds  Findings:                 The perianal and digital rectal examinations were                            normal.  A 1 mm polyp was found in the ascending colon. The                            polyp was sessile. The polyp was removed with a                            cold biopsy forceps. Resection and retrieval were                            complete.                           A few small-mouthed diverticula were found in the                            sigmoid colon.                           Non-bleeding external and internal hemorrhoids were                            found during retroflexion. The hemorrhoids were                            small. Complications:            No immediate complications. Estimated Blood Loss:     Estimated blood loss was minimal. Impression:               - One 1 mm polyp in the ascending colon, removed                            with a cold biopsy forceps. Resected and retrieved.                           - Diverticulosis in the sigmoid colon.                            - Non-bleeding external and internal hemorrhoids. Recommendation:           - Patient has a contact number available for                            emergencies. The signs and symptoms of potential                            delayed complications were discussed with the                            patient. Return to normal activities tomorrow.                            Written discharge instructions were provided to the                            patient.                           -  Resume previous diet.                           - Continue present medications.                           - Await pathology results.                           - Repeat colonoscopy in 5-10 years for surveillance                            based on pathology results. Napoleon Form, MD 03/19/2023 12:23:48 PM This report has been signed electronically.

## 2023-03-19 NOTE — Progress Notes (Signed)
Called to room to assist during endoscopic procedure.  Patient ID and intended procedure confirmed with present staff. Received instructions for my participation in the procedure from the performing physician.  

## 2023-03-19 NOTE — Progress Notes (Signed)
[facility] History and Physical   Primary Care Physician:  Jackelyn Poling, DO   Reason for Procedure:  History of adenomatous colon polyps  Plan:    Surveillance colonoscopy with possible interventions as needed     HPI: Christine Robles is a very pleasant 67 y.o. female here for surveillance colonoscopy. Denies any nausea, vomiting, abdominal pain, melena or bright red blood per rectum  The risks and benefits as well as alternatives of endoscopic procedure(s) have been discussed and reviewed. All questions answered. The patient agrees to proceed.    Past Medical History:  Diagnosis Date   Anxiety    Bipolar disorder (HCC)    "vs ADHD" (09/26/2016)   Candida esophagitis (HCC)    Chronic lower back pain    Depression    Esophagitis    Fibromyalgia    Gastroparesis    Headache    "q 4 months now" (09/26/2016)   Hyperlipidemia    Hypertension    Hypothyroidism    IBS (irritable bowel syndrome)    Migraine    "maybe 1-2/year" (09/26/2016)   Occasional tremors    pt reports hx of hands shaking per pt that come and go   OSA on CPAP    Panic disorder    Pre-diabetes    Rheumatoid arthritis (HCC)    "all over" (09/26/2016)   Sleep apnea    Spinal stenosis    Thyroid disease    hypothyroidism    Past Surgical History:  Procedure Laterality Date   ABDOMINAL HERNIA REPAIR  02/01/2011   [facility]   BREAST BIOPSY Left    15+ years ago no visual scars, 2x   CARDIAC CATHETERIZATION  2012   DILATION AND CURETTAGE OF UTERUS     KNEE ARTHROSCOPY Left 01/24/2006   hx/notes 10/17/2010   LAPAROSCOPIC CHOLECYSTECTOMY  05/2001   hx/notes 10/17/2010   LIPOMA EXCISION Right 12/2001   thigh, hx/notes 10/17/2010   LUMBAR DISC SURGERY  1990s   POSTERIOR LUMBAR FUSION  12/2008   "L4, L5, S1"   TONSILLECTOMY     TOTAL KNEE ARTHROPLASTY Left 09/26/2016   Procedure: LEFT TOTAL KNEE ARTHROPLASTY;  Surgeon: Loreta Ave, MD;  Location: [facility] OR;  Service: Orthopedics;   Laterality: Left;   TOTAL KNEE ARTHROPLASTY Right 01/24/2021   Procedure: TOTAL KNEE ARTHROPLASTY;  Surgeon: Teryl Lucy, MD;  Location: [facility] ORS;  Service: Orthopedics;  Laterality: Right;   TUBAL LIGATION      Prior to Admission medications   Medication Sig Start Date End Date Taking? Authorizing Provider  albuterol (VENTOLIN HFA) 108 (90 Base) MCG/ACT inhaler 1 puff every 4 (four) hours. 02/20/23   [provider]  ALPRAZolam Prudy Feeler) 1 MG tablet Take 0.5-1 mg by mouth 2 (two) times daily as needed for anxiety.    [provider]  amLODipine (NORVASC) 5 MG tablet Take 5 mg by mouth in the morning. Patient not taking: Reported on 03/05/2023 09/05/20   [provider]  atorvastatin (LIPITOR) 20 MG tablet Take 20 mg by mouth daily.    [provider]  buPROPion ER (WELLBUTRIN SR) 100 MG 12 hr tablet Take 100 mg by mouth in the morning.    [provider]  Cholecalciferol (VITAMIN D3) 50000 units CAPS Take 50,000 Units by mouth every Thursday. 02/11/18   [provider]  FLUoxetine (PROZAC) 20 MG capsule Take 20 mg by mouth daily with breakfast.    [provider]  gabapentin (NEURONTIN) 100 MG capsule Take 1 capsule (  100 mg total) by mouth 3 (three) times daily. 07/23/22   Levert Feinstein, MD  HYDROcodone-acetaminophen (NORCO/VICODIN) 5-325 MG tablet Take 1 tablet by mouth every 6 (six) hours as needed. 02/11/23   [provider]  L-Methylfolate 15 MG TABS Take 1 tablet by mouth daily. 01/24/23   [provider]  lamoTRIgine (LAMICTAL) 200 MG tablet Take 200 mg by mouth in the morning. 12/09/17   [provider]  levothyroxine (SYNTHROID, LEVOTHROID) 112 MCG tablet Take 112 mcg by mouth daily before breakfast. 02/25/18   [provider]  Melatonin 10 MG TABS Take 10 mg by mouth at bedtime.    [provider]  meloxicam (MOBIC) 15 MG tablet Take 15 mg by mouth daily.    [provider]   metoCLOPramide (REGLAN) 10 MG tablet Take 1 tablet (10 mg total) by mouth every 8 (eight) hours as needed for nausea. Patient not taking: Reported on 03/05/2023 03/25/18   Rozier, Winfield Rast, MD  Multiple Vitamins-Minerals (MULTIVITAMIN ADULTS 50+) TABS Take by mouth.    [provider]  ondansetron (ZOFRAN-ODT) 4 MG disintegrating tablet 4mg  ODT q4 hours prn nausea/vomit 01/11/23   Charlynne Pander, MD  pantoprazole (PROTONIX) 40 MG tablet TAKE 1 TABLET BY MOUTH TWICE DAILY Patient taking differently: Take 40 mg by mouth daily. 09/20/14   Hart Carwin, MD  Polyethyl Glycol-Propyl Glycol 0.4-0.3 % SOLN Place 1-2 drops into both eyes 3 (three) times daily as needed (dry/irritated eyes).    [provider]  predniSONE (DELTASONE) 20 MG tablet Take by mouth. 11/05/22   [provider]  QUEtiapine Fumarate (SEROQUEL XR) 150 MG 24 hr tablet Take 150 mg by mouth at bedtime. 06/11/22   [provider]  RELISTOR 150 MG TABS Take 150 mg by mouth in the morning, at noon, and at bedtime. 03/16/18   [provider]  rOPINIRole (REQUIP) 0.25 MG tablet Take 1 tablet by mouth at bedtime.    [provider]  Semaglutide,0.25 or 0.5MG /DOS, (OZEMPIC, 0.25 OR 0.5 MG/DOSE,) 2 MG/1.5ML SOPN Inject 0.25 mg into the skin every Friday. 10/27/20   [provider]  SIMPONI 50 MG/0.5ML SOAJ Inject 50 mg into the skin every 30 (thirty) days. 12/25/20   [provider]  triamterene-hydrochlorothiazide (MAXZIDE-25) 37.5-25 MG per tablet Take 1 each (1 tablet total) by mouth daily. 08/14/12   Charm Rings, NP    Current Outpatient Medications  Medication Sig Dispense Refill   albuterol (VENTOLIN HFA) 108 (90 Base) MCG/ACT inhaler 1 puff every 4 (four) hours.     ALPRAZolam (XANAX) 1 MG tablet Take 0.5-1 mg by mouth 2 (two) times daily as needed for anxiety.     amLODipine (NORVASC) 5 MG tablet Take 5 mg by mouth in the morning. (Patient not taking: Reported  on 03/05/2023)     atorvastatin (LIPITOR) 20 MG tablet Take 20 mg by mouth daily.     buPROPion ER (WELLBUTRIN SR) 100 MG 12 hr tablet Take 100 mg by mouth in the morning.     Cholecalciferol (VITAMIN D3) 50000 units CAPS Take 50,000 Units by mouth every Thursday.  1   FLUoxetine (PROZAC) 20 MG capsule Take 20 mg by mouth daily with breakfast.     gabapentin (NEURONTIN) 100 MG capsule Take 1 capsule (100 mg total) by mouth 3 (three) times daily. 90 capsule 6   HYDROcodone-acetaminophen (NORCO/VICODIN) 5-325 MG tablet Take 1 tablet by mouth every 6 (six) hours as needed.  L-Methylfolate 15 MG TABS Take 1 tablet by mouth daily.     lamoTRIgine (LAMICTAL) 200 MG tablet Take 200 mg by mouth in the morning.  1   levothyroxine (SYNTHROID, LEVOTHROID) 112 MCG tablet Take 112 mcg by mouth daily before breakfast.  5   Melatonin 10 MG TABS Take 10 mg by mouth at bedtime.     meloxicam (MOBIC) 15 MG tablet Take 15 mg by mouth daily.     metoCLOPramide (REGLAN) 10 MG tablet Take 1 tablet (10 mg total) by mouth every 8 (eight) hours as needed for nausea. (Patient not taking: Reported on 03/05/2023) 30 tablet 0   Multiple Vitamins-Minerals (MULTIVITAMIN ADULTS 50+) TABS Take by mouth.     ondansetron (ZOFRAN-ODT) 4 MG disintegrating tablet 4mg  ODT q4 hours prn nausea/vomit 10 tablet 0   pantoprazole (PROTONIX) 40 MG tablet TAKE 1 TABLET BY MOUTH TWICE DAILY (Patient taking differently: Take 40 mg by mouth daily.) 60 tablet 2   Polyethyl Glycol-Propyl Glycol 0.4-0.3 % SOLN Place 1-2 drops into both eyes 3 (three) times daily as needed (dry/irritated eyes).     predniSONE (DELTASONE) 20 MG tablet Take by mouth.     QUEtiapine Fumarate (SEROQUEL XR) 150 MG 24 hr tablet Take 150 mg by mouth at bedtime.     RELISTOR 150 MG TABS Take 150 mg by mouth in the morning, at noon, and at bedtime.  2   rOPINIRole (REQUIP) 0.25 MG tablet Take 1 tablet by mouth at bedtime.     Semaglutide,0.25 or 0.5MG /DOS, (OZEMPIC, 0.25  OR 0.5 MG/DOSE,) 2 MG/1.5ML SOPN Inject 0.25 mg into the skin every Friday.     SIMPONI 50 MG/0.5ML SOAJ Inject 50 mg into the skin every 30 (thirty) days.     triamterene-hydrochlorothiazide (MAXZIDE-25) 37.5-25 MG per tablet Take 1 each (1 tablet total) by mouth daily. 30 tablet 0   Current Facility-Administered Medications  Medication Dose Route Frequency Provider Last Rate Last Admin   0.9 %  sodium chloride infusion  500 mL Intravenous Once Napoleon Form, MD        Allergies as of 03/19/2023 - Review Complete 03/19/2023  Allergen Reaction Noted   Escitalopram oxalate Other (See Comments) 01/31/2011   Gabapentin Other (See Comments) 08/10/2021   Buspirone hcl Other (See Comments) 03/08/2009   Fentanyl Other (See Comments) 03/11/2014   Morphine and codeine Other (See Comments) 03/11/2014    Family History  Problem Relation Age of Onset   Leukemia Mother 63   Hypertension Father    Heart disease Father    Cirrhosis Father    Crohn's disease Father    Heart disease Brother        pacemaker   Peripheral Artery Disease Brother    Hypertension Brother    Diabetes Brother    Heart disease Maternal Grandmother    Diabetes Maternal Grandfather    Colon cancer Neg Hx    Colon polyps Neg Hx    Esophageal cancer Neg Hx    Rectal cancer Neg Hx    Stomach cancer Neg Hx     Social History   Socioeconomic History   Marital status: Widowed    Spouse name: Not on file   Number of children: 1   Years of education: Not on file   Highest education level: Some college, no degree  Occupational History   Occupation: diabled  Tobacco Use   Smoking status: Former    Current packs/day: 0.00    Average packs/day: 1 pack/day for  15.0 years (15.0 ttl pk-yrs)    Types: Cigarettes    Start date: 08/08/1983    Quit date: 08/08/1998    Years since quitting: 24.6   Smokeless tobacco: Never  Vaping Use   Vaping status: Never Used  Substance and Sexual Activity   Alcohol use: Not  Currently   Drug use: Never   Sexual activity: Not Currently  Other Topics Concern   Not on file  Social History Narrative   Patient is right-handed. She lives alone in a one level home. She drinks 1/2 cup of coffee a day. She exercises occasionally on a stationary bike at home.   Social Determinants of Health   Financial Resource Strain: Not on file  Food Insecurity: No Food Insecurity (09/07/2022)   Hunger Vital Sign    Worried About Running Out of Food in the Last Year: Never true    Ran Out of Food in the Last Year: Never true  Transportation Needs: No Transportation Needs (09/07/2022)   PRAPARE - Administrator, Civil Service (Medical): No    Lack of Transportation (Non-Medical): No  Physical Activity: Not on file  Stress: Not on file  Social Connections: Not on file  Intimate Partner Violence: Not on file    Review of Systems:  All other review of systems negative except as mentioned in the HPI.  Physical Exam: Vital signs in last 24 hours: BP (!) 144/92   Pulse 76   Temp (!) 96.8 F (36 C) (Temporal)   Ht 5\' 6"  (1.676 m)   Wt 198 lb (89.8 kg)   LMP 08/08/1994   SpO2 96%   BMI 31.96 kg/m  General:   Alert, NAD Lungs:  Clear .   Heart:  Regular rate and rhythm Abdomen:  Soft, nontender and nondistended. Neuro/Psych:  Alert and cooperative. Normal mood and affect. A and O x 3  Reviewed labs, radiology imaging, old records and pertinent past GI work up  Patient is appropriate for planned procedure(s) and anesthesia in an ambulatory setting   K. Scherry Ran , MD [phone]

## 2023-03-19 NOTE — Progress Notes (Signed)
Pt's states no medical or surgical changes since previsit or office visit. 

## 2023-03-20 ENCOUNTER — Telehealth: Payer: Self-pay | Admitting: *Deleted

## 2023-03-20 NOTE — Telephone Encounter (Signed)
No answer on  follow up call. Left message.

## 2023-03-21 ENCOUNTER — Ambulatory Visit: Payer: Medicare Other

## 2023-03-21 LAB — SURGICAL PATHOLOGY

## 2023-03-26 DIAGNOSIS — Z03818 Encounter for observation for suspected exposure to other biological agents ruled out: Secondary | ICD-10-CM | POA: Diagnosis not present

## 2023-03-26 DIAGNOSIS — J069 Acute upper respiratory infection, unspecified: Secondary | ICD-10-CM | POA: Diagnosis not present

## 2023-04-04 ENCOUNTER — Encounter: Payer: Self-pay | Admitting: Gastroenterology

## 2023-05-27 DIAGNOSIS — I10 Essential (primary) hypertension: Secondary | ICD-10-CM | POA: Diagnosis not present

## 2023-05-27 DIAGNOSIS — H35369 Drusen (degenerative) of macula, unspecified eye: Secondary | ICD-10-CM | POA: Diagnosis not present

## 2023-05-27 DIAGNOSIS — H5711 Ocular pain, right eye: Secondary | ICD-10-CM | POA: Diagnosis not present

## 2023-07-23 DIAGNOSIS — H02052 Trichiasis without entropian right lower eyelid: Secondary | ICD-10-CM | POA: Diagnosis not present

## 2023-07-31 DIAGNOSIS — E7800 Pure hypercholesterolemia, unspecified: Secondary | ICD-10-CM | POA: Diagnosis not present

## 2023-07-31 DIAGNOSIS — I10 Essential (primary) hypertension: Secondary | ICD-10-CM | POA: Diagnosis not present

## 2023-07-31 DIAGNOSIS — E119 Type 2 diabetes mellitus without complications: Secondary | ICD-10-CM | POA: Diagnosis not present

## 2023-07-31 DIAGNOSIS — E039 Hypothyroidism, unspecified: Secondary | ICD-10-CM | POA: Diagnosis not present

## 2023-07-31 DIAGNOSIS — M069 Rheumatoid arthritis, unspecified: Secondary | ICD-10-CM | POA: Diagnosis not present

## 2023-08-07 DIAGNOSIS — E875 Hyperkalemia: Secondary | ICD-10-CM | POA: Diagnosis not present

## 2023-10-03 ENCOUNTER — Other Ambulatory Visit: Payer: Self-pay | Admitting: Physical Medicine and Rehabilitation

## 2023-10-03 DIAGNOSIS — M5412 Radiculopathy, cervical region: Secondary | ICD-10-CM

## 2023-10-04 DIAGNOSIS — M25532 Pain in left wrist: Secondary | ICD-10-CM | POA: Diagnosis not present

## 2023-10-04 DIAGNOSIS — M05732 Rheumatoid arthritis with rheumatoid factor of left wrist without organ or systems involvement: Secondary | ICD-10-CM | POA: Diagnosis not present

## 2023-10-14 DIAGNOSIS — M069 Rheumatoid arthritis, unspecified: Secondary | ICD-10-CM | POA: Diagnosis not present

## 2023-10-14 DIAGNOSIS — G894 Chronic pain syndrome: Secondary | ICD-10-CM | POA: Diagnosis not present

## 2023-10-14 DIAGNOSIS — M47817 Spondylosis without myelopathy or radiculopathy, lumbosacral region: Secondary | ICD-10-CM | POA: Diagnosis not present

## 2023-10-15 DIAGNOSIS — I10 Essential (primary) hypertension: Secondary | ICD-10-CM | POA: Diagnosis not present

## 2023-10-15 DIAGNOSIS — G2581 Restless legs syndrome: Secondary | ICD-10-CM | POA: Diagnosis not present

## 2023-10-15 DIAGNOSIS — Z9989 Dependence on other enabling machines and devices: Secondary | ICD-10-CM | POA: Diagnosis not present

## 2023-10-15 DIAGNOSIS — G4733 Obstructive sleep apnea (adult) (pediatric): Secondary | ICD-10-CM | POA: Diagnosis not present

## 2023-10-15 DIAGNOSIS — M069 Rheumatoid arthritis, unspecified: Secondary | ICD-10-CM | POA: Diagnosis not present

## 2023-10-15 DIAGNOSIS — E039 Hypothyroidism, unspecified: Secondary | ICD-10-CM | POA: Diagnosis not present

## 2023-10-15 DIAGNOSIS — E119 Type 2 diabetes mellitus without complications: Secondary | ICD-10-CM | POA: Diagnosis not present

## 2023-10-15 DIAGNOSIS — G4721 Circadian rhythm sleep disorder, delayed sleep phase type: Secondary | ICD-10-CM | POA: Diagnosis not present

## 2023-10-17 DIAGNOSIS — Z79899 Other long term (current) drug therapy: Secondary | ICD-10-CM | POA: Diagnosis not present

## 2023-10-17 DIAGNOSIS — M159 Polyosteoarthritis, unspecified: Secondary | ICD-10-CM | POA: Diagnosis not present

## 2023-10-17 DIAGNOSIS — M0579 Rheumatoid arthritis with rheumatoid factor of multiple sites without organ or systems involvement: Secondary | ICD-10-CM | POA: Diagnosis not present

## 2023-10-17 DIAGNOSIS — M25532 Pain in left wrist: Secondary | ICD-10-CM | POA: Diagnosis not present

## 2023-10-21 ENCOUNTER — Other Ambulatory Visit

## 2023-10-30 DIAGNOSIS — G2581 Restless legs syndrome: Secondary | ICD-10-CM | POA: Diagnosis not present

## 2023-10-31 DIAGNOSIS — J302 Other seasonal allergic rhinitis: Secondary | ICD-10-CM | POA: Diagnosis not present

## 2023-11-01 DIAGNOSIS — H02053 Trichiasis without entropian right eye, unspecified eyelid: Secondary | ICD-10-CM | POA: Diagnosis not present

## 2023-11-05 DIAGNOSIS — G4733 Obstructive sleep apnea (adult) (pediatric): Secondary | ICD-10-CM | POA: Diagnosis not present

## 2023-11-06 DIAGNOSIS — G4700 Insomnia, unspecified: Secondary | ICD-10-CM | POA: Diagnosis not present

## 2023-11-06 DIAGNOSIS — G4733 Obstructive sleep apnea (adult) (pediatric): Secondary | ICD-10-CM | POA: Diagnosis not present

## 2023-11-06 DIAGNOSIS — G2581 Restless legs syndrome: Secondary | ICD-10-CM | POA: Diagnosis not present

## 2023-11-08 ENCOUNTER — Other Ambulatory Visit (HOSPITAL_BASED_OUTPATIENT_CLINIC_OR_DEPARTMENT_OTHER): Payer: Self-pay | Admitting: Physical Medicine and Rehabilitation

## 2023-11-08 ENCOUNTER — Ambulatory Visit (HOSPITAL_BASED_OUTPATIENT_CLINIC_OR_DEPARTMENT_OTHER)
Admission: RE | Admit: 2023-11-08 | Discharge: 2023-11-08 | Disposition: A | Discharge status: Home / Self Care | Source: Ambulatory Visit | Attending: Physical Medicine and Rehabilitation | Admitting: Physical Medicine and Rehabilitation

## 2023-11-08 DIAGNOSIS — Z9181 History of falling: Secondary | ICD-10-CM | POA: Diagnosis not present

## 2023-11-08 DIAGNOSIS — R52 Pain, unspecified: Secondary | ICD-10-CM

## 2023-11-08 DIAGNOSIS — M47816 Spondylosis without myelopathy or radiculopathy, lumbar region: Secondary | ICD-10-CM | POA: Diagnosis not present

## 2023-11-08 DIAGNOSIS — Z4789 Encounter for other orthopedic aftercare: Secondary | ICD-10-CM | POA: Diagnosis not present

## 2023-11-08 DIAGNOSIS — M419 Scoliosis, unspecified: Secondary | ICD-10-CM | POA: Diagnosis not present

## 2023-11-08 DIAGNOSIS — M549 Dorsalgia, unspecified: Secondary | ICD-10-CM | POA: Diagnosis not present

## 2023-11-08 DIAGNOSIS — M47814 Spondylosis without myelopathy or radiculopathy, thoracic region: Secondary | ICD-10-CM | POA: Diagnosis not present

## 2023-11-08 DIAGNOSIS — M4316 Spondylolisthesis, lumbar region: Secondary | ICD-10-CM | POA: Diagnosis not present

## 2023-11-11 DIAGNOSIS — G894 Chronic pain syndrome: Secondary | ICD-10-CM | POA: Diagnosis not present

## 2023-11-11 DIAGNOSIS — M069 Rheumatoid arthritis, unspecified: Secondary | ICD-10-CM | POA: Diagnosis not present

## 2023-11-11 DIAGNOSIS — M47817 Spondylosis without myelopathy or radiculopathy, lumbosacral region: Secondary | ICD-10-CM | POA: Diagnosis not present

## 2023-11-12 DIAGNOSIS — G894 Chronic pain syndrome: Secondary | ICD-10-CM | POA: Diagnosis not present

## 2023-11-12 DIAGNOSIS — Z79891 Long term (current) use of opiate analgesic: Secondary | ICD-10-CM | POA: Diagnosis not present

## 2023-11-12 DIAGNOSIS — M1612 Unilateral primary osteoarthritis, left hip: Secondary | ICD-10-CM | POA: Diagnosis not present

## 2023-11-12 DIAGNOSIS — M47817 Spondylosis without myelopathy or radiculopathy, lumbosacral region: Secondary | ICD-10-CM | POA: Diagnosis not present

## 2023-11-13 DIAGNOSIS — M1612 Unilateral primary osteoarthritis, left hip: Secondary | ICD-10-CM | POA: Diagnosis not present

## 2023-11-13 DIAGNOSIS — Z79891 Long term (current) use of opiate analgesic: Secondary | ICD-10-CM | POA: Diagnosis not present

## 2023-11-13 DIAGNOSIS — G894 Chronic pain syndrome: Secondary | ICD-10-CM | POA: Diagnosis not present

## 2023-11-13 DIAGNOSIS — M47817 Spondylosis without myelopathy or radiculopathy, lumbosacral region: Secondary | ICD-10-CM | POA: Diagnosis not present

## 2023-11-14 DIAGNOSIS — G894 Chronic pain syndrome: Secondary | ICD-10-CM | POA: Diagnosis not present

## 2023-11-14 DIAGNOSIS — M47817 Spondylosis without myelopathy or radiculopathy, lumbosacral region: Secondary | ICD-10-CM | POA: Diagnosis not present

## 2023-11-14 DIAGNOSIS — Z79891 Long term (current) use of opiate analgesic: Secondary | ICD-10-CM | POA: Diagnosis not present

## 2023-11-14 DIAGNOSIS — M1612 Unilateral primary osteoarthritis, left hip: Secondary | ICD-10-CM | POA: Diagnosis not present

## 2023-11-18 DIAGNOSIS — H02052 Trichiasis without entropian right lower eyelid: Secondary | ICD-10-CM | POA: Diagnosis not present

## 2023-11-20 DIAGNOSIS — J029 Acute pharyngitis, unspecified: Secondary | ICD-10-CM | POA: Diagnosis not present

## 2023-11-20 DIAGNOSIS — B370 Candidal stomatitis: Secondary | ICD-10-CM | POA: Diagnosis not present

## 2023-12-02 ENCOUNTER — Telehealth: Payer: Self-pay | Admitting: Pharmacist

## 2023-12-02 NOTE — Progress Notes (Signed)
   12/02/2023  Patient ID: Christine Robles, female   DOB: 01-20-1956, 68 y.o.   MRN: [id number]  Patient appeared on insurance report for at-risk for failing 2025 metric: Medication Adherence for Diabetes (MAD)   Outreach to the patient was Successful.   Medication: Ozempic Last fill date: 08/29/23 42DS  Fail date: 12/05/23  Called patient and spoke with her the phone. She reports no difficulty affording the medication, but just got lazy and stopped taking it for awhile. Recently restarted the medication. Taking Ozempic 0.25mg  weekly at this time and will work on titrating herself back up to the 1 mg dose.   Of note, reports swelling over the past few weeks. Swelling in the right ankle- no history of injury prior or recently. Mentioned Maxizde (triamterene -hydrochlorothiazide ) which was stopped in September 2024. Advised I will notify Dr. Dayna for suggestion.    Aloysius Lewis, PharmD [facility]  Phone Number: [phone]

## 2023-12-09 DIAGNOSIS — G894 Chronic pain syndrome: Secondary | ICD-10-CM | POA: Diagnosis not present

## 2023-12-09 DIAGNOSIS — M069 Rheumatoid arthritis, unspecified: Secondary | ICD-10-CM | POA: Diagnosis not present

## 2023-12-09 DIAGNOSIS — M47817 Spondylosis without myelopathy or radiculopathy, lumbosacral region: Secondary | ICD-10-CM | POA: Diagnosis not present

## 2023-12-12 DIAGNOSIS — M1612 Unilateral primary osteoarthritis, left hip: Secondary | ICD-10-CM | POA: Diagnosis not present

## 2023-12-12 DIAGNOSIS — G894 Chronic pain syndrome: Secondary | ICD-10-CM | POA: Diagnosis not present

## 2023-12-12 DIAGNOSIS — M47817 Spondylosis without myelopathy or radiculopathy, lumbosacral region: Secondary | ICD-10-CM | POA: Diagnosis not present

## 2023-12-12 DIAGNOSIS — Z79891 Long term (current) use of opiate analgesic: Secondary | ICD-10-CM | POA: Diagnosis not present

## 2023-12-14 DIAGNOSIS — G894 Chronic pain syndrome: Secondary | ICD-10-CM | POA: Diagnosis not present

## 2023-12-14 DIAGNOSIS — Z79891 Long term (current) use of opiate analgesic: Secondary | ICD-10-CM | POA: Diagnosis not present

## 2023-12-14 DIAGNOSIS — M1612 Unilateral primary osteoarthritis, left hip: Secondary | ICD-10-CM | POA: Diagnosis not present

## 2023-12-14 DIAGNOSIS — M47817 Spondylosis without myelopathy or radiculopathy, lumbosacral region: Secondary | ICD-10-CM | POA: Diagnosis not present

## 2023-12-20 DIAGNOSIS — H02053 Trichiasis without entropian right eye, unspecified eyelid: Secondary | ICD-10-CM | POA: Diagnosis not present

## 2023-12-23 DIAGNOSIS — Z01818 Encounter for other preprocedural examination: Secondary | ICD-10-CM | POA: Diagnosis not present

## 2023-12-23 DIAGNOSIS — H0279 Other degenerative disorders of eyelid and periocular area: Secondary | ICD-10-CM | POA: Diagnosis not present

## 2023-12-23 DIAGNOSIS — H02052 Trichiasis without entropian right lower eyelid: Secondary | ICD-10-CM | POA: Diagnosis not present

## 2023-12-24 DIAGNOSIS — Z0000 Encounter for general adult medical examination without abnormal findings: Secondary | ICD-10-CM | POA: Diagnosis not present

## 2023-12-24 DIAGNOSIS — Z23 Encounter for immunization: Secondary | ICD-10-CM | POA: Diagnosis not present

## 2023-12-27 ENCOUNTER — Encounter: Payer: Self-pay | Admitting: *Deleted

## 2024-01-02 DIAGNOSIS — E039 Hypothyroidism, unspecified: Secondary | ICD-10-CM | POA: Diagnosis not present

## 2024-01-02 DIAGNOSIS — M069 Rheumatoid arthritis, unspecified: Secondary | ICD-10-CM | POA: Diagnosis not present

## 2024-01-06 DIAGNOSIS — G894 Chronic pain syndrome: Secondary | ICD-10-CM | POA: Diagnosis not present

## 2024-01-06 DIAGNOSIS — M069 Rheumatoid arthritis, unspecified: Secondary | ICD-10-CM | POA: Diagnosis not present

## 2024-01-06 DIAGNOSIS — M47817 Spondylosis without myelopathy or radiculopathy, lumbosacral region: Secondary | ICD-10-CM | POA: Diagnosis not present

## 2024-01-12 DIAGNOSIS — G894 Chronic pain syndrome: Secondary | ICD-10-CM | POA: Diagnosis not present

## 2024-01-12 DIAGNOSIS — M1612 Unilateral primary osteoarthritis, left hip: Secondary | ICD-10-CM | POA: Diagnosis not present

## 2024-01-12 DIAGNOSIS — Z79891 Long term (current) use of opiate analgesic: Secondary | ICD-10-CM | POA: Diagnosis not present

## 2024-01-12 DIAGNOSIS — M47817 Spondylosis without myelopathy or radiculopathy, lumbosacral region: Secondary | ICD-10-CM | POA: Diagnosis not present

## 2024-01-14 DIAGNOSIS — M47817 Spondylosis without myelopathy or radiculopathy, lumbosacral region: Secondary | ICD-10-CM | POA: Diagnosis not present

## 2024-01-14 DIAGNOSIS — Z79891 Long term (current) use of opiate analgesic: Secondary | ICD-10-CM | POA: Diagnosis not present

## 2024-01-14 DIAGNOSIS — M1612 Unilateral primary osteoarthritis, left hip: Secondary | ICD-10-CM | POA: Diagnosis not present

## 2024-01-14 DIAGNOSIS — G894 Chronic pain syndrome: Secondary | ICD-10-CM | POA: Diagnosis not present

## 2024-01-27 DIAGNOSIS — M05732 Rheumatoid arthritis with rheumatoid factor of left wrist without organ or systems involvement: Secondary | ICD-10-CM | POA: Diagnosis not present

## 2024-01-27 DIAGNOSIS — M4726 Other spondylosis with radiculopathy, lumbar region: Secondary | ICD-10-CM | POA: Diagnosis not present

## 2024-02-02 DIAGNOSIS — E039 Hypothyroidism, unspecified: Secondary | ICD-10-CM | POA: Diagnosis not present

## 2024-02-02 DIAGNOSIS — M069 Rheumatoid arthritis, unspecified: Secondary | ICD-10-CM | POA: Diagnosis not present

## 2024-02-04 DIAGNOSIS — M47817 Spondylosis without myelopathy or radiculopathy, lumbosacral region: Secondary | ICD-10-CM | POA: Diagnosis not present

## 2024-02-04 DIAGNOSIS — G894 Chronic pain syndrome: Secondary | ICD-10-CM | POA: Diagnosis not present

## 2024-02-04 DIAGNOSIS — M069 Rheumatoid arthritis, unspecified: Secondary | ICD-10-CM | POA: Diagnosis not present

## 2024-02-05 DIAGNOSIS — H02053 Trichiasis without entropian right eye, unspecified eyelid: Secondary | ICD-10-CM | POA: Diagnosis not present

## 2024-02-05 DIAGNOSIS — H02889 Meibomian gland dysfunction of unspecified eye, unspecified eyelid: Secondary | ICD-10-CM | POA: Diagnosis not present

## 2024-02-11 DIAGNOSIS — E039 Hypothyroidism, unspecified: Secondary | ICD-10-CM | POA: Diagnosis not present

## 2024-02-11 DIAGNOSIS — R002 Palpitations: Secondary | ICD-10-CM | POA: Diagnosis not present

## 2024-02-12 DIAGNOSIS — Z79891 Long term (current) use of opiate analgesic: Secondary | ICD-10-CM | POA: Diagnosis not present

## 2024-02-12 DIAGNOSIS — M47817 Spondylosis without myelopathy or radiculopathy, lumbosacral region: Secondary | ICD-10-CM | POA: Diagnosis not present

## 2024-02-12 DIAGNOSIS — M1612 Unilateral primary osteoarthritis, left hip: Secondary | ICD-10-CM | POA: Diagnosis not present

## 2024-02-12 DIAGNOSIS — G894 Chronic pain syndrome: Secondary | ICD-10-CM | POA: Diagnosis not present

## 2024-02-14 ENCOUNTER — Telehealth: Payer: Self-pay | Admitting: Cardiology

## 2024-02-14 NOTE — Telephone Encounter (Signed)
 Sure.   Dr. Maryanne Huneycutt

## 2024-02-14 NOTE — Telephone Encounter (Signed)
 Called pt to inform her on response, she states she prefers [facility] or [place] office because she is closer to there. I have her added to waitlist for a cancellation.

## 2024-02-14 NOTE — Telephone Encounter (Signed)
 Redell, I completely understand.   Christine Robles,  I am happy to help either way.   ST

## 2024-02-14 NOTE — Telephone Encounter (Signed)
 Pt called in requesting to switch to Dr. Pietro, is this switch okay?

## 2024-02-18 DIAGNOSIS — E039 Hypothyroidism, unspecified: Secondary | ICD-10-CM | POA: Diagnosis not present

## 2024-02-18 DIAGNOSIS — E119 Type 2 diabetes mellitus without complications: Secondary | ICD-10-CM | POA: Diagnosis not present

## 2024-02-18 DIAGNOSIS — E559 Vitamin D deficiency, unspecified: Secondary | ICD-10-CM | POA: Diagnosis not present

## 2024-02-18 DIAGNOSIS — G2581 Restless legs syndrome: Secondary | ICD-10-CM | POA: Diagnosis not present

## 2024-02-18 DIAGNOSIS — I10 Essential (primary) hypertension: Secondary | ICD-10-CM | POA: Diagnosis not present

## 2024-02-18 DIAGNOSIS — Z23 Encounter for immunization: Secondary | ICD-10-CM | POA: Diagnosis not present

## 2024-02-18 DIAGNOSIS — R002 Palpitations: Secondary | ICD-10-CM | POA: Diagnosis not present

## 2024-02-18 DIAGNOSIS — E7800 Pure hypercholesterolemia, unspecified: Secondary | ICD-10-CM | POA: Diagnosis not present

## 2024-02-18 LAB — LAB REPORT - SCANNED
A1c: 5.5
EGFR: 61

## 2024-02-25 DIAGNOSIS — H0279 Other degenerative disorders of eyelid and periocular area: Secondary | ICD-10-CM | POA: Diagnosis not present

## 2024-02-25 DIAGNOSIS — H02032 Senile entropion of right lower eyelid: Secondary | ICD-10-CM | POA: Diagnosis not present

## 2024-02-25 DIAGNOSIS — Z01818 Encounter for other preprocedural examination: Secondary | ICD-10-CM | POA: Diagnosis not present

## 2024-02-25 DIAGNOSIS — H02052 Trichiasis without entropian right lower eyelid: Secondary | ICD-10-CM | POA: Diagnosis not present

## 2024-02-25 DIAGNOSIS — H02532 Eyelid retraction right lower eyelid: Secondary | ICD-10-CM | POA: Diagnosis not present

## 2024-03-02 DIAGNOSIS — M05732 Rheumatoid arthritis with rheumatoid factor of left wrist without organ or systems involvement: Secondary | ICD-10-CM | POA: Diagnosis not present

## 2024-03-02 NOTE — Progress Notes (Unsigned)
 OFFICE NOTE:    Date:  03/03/2024  ID:  Christine Robles, DOB [birth year], MRN [id number] PCP: Dayna Motto, DO  [facility] Providers Cardiologist:  Madonna Large, DO Cardiology APP:  Lelon Glendia DASEN, PA-C        Pharmacologic MPI 01/02/2021: Breast attenuation, no ischemia, low risk TTE 12/20/2020: EF 61, no RWMA, no significant valvular abnormality Hypertension Hyperlipidemia Hypothyroidism Obesity Prediabetes Gastroparesis OSA Rheumatoid arthritis Hyponatremia        Discussed the use of AI scribe software for clinical note transcription with the patient, who gave verbal consent to proceed. History of Present Illness Christine Robles is a 68 y.o. female who returns for evaluation of palpitations. She was last seen in 08/2021 by Dr. Large.   She experiences heart palpitations that have become more frequent and noticeable over time. These episodes, described as a racing heart, occur intermittently throughout the day and sometimes last the entire day. Palpitations have been occurring at least once a week over the past few months. She wore a heart monitor for five days, and symptoms were present during the monitoring period, though results are pending. No chest discomfort, shortness of breath, or syncope during these episodes. She consumes one to two cups of coffee in the morning and occasionally a glass of tea in the afternoon.      ROS-See HPI    Studies Reviewed:       05/05/2021: Na 134, K 3.4 01/11/2023: Na 126, K 3.5, creatinine 1.2, ALT 19, Hgb 13.6, PLT 425K  EKG from primary care 02/11/2024 personally reviewed and interpreted by me today 03/03/2024: NSR, HR 78, normal axis, no ST-T wave changes, QTc 456, PACs Results LABS - Per [facility] Total cholesterol: 137 (02/18/2024) HDL: 76 (02/18/2024) Triglycerides: 47 (02/18/2024) A1c: 5.5 (02/18/2024)          Physical Exam:  VS:  BP 137/76   Pulse 80   Ht 5' 6 (1.676 m)   Wt 214 lb 12.8 oz (97.4 kg)    LMP 08/08/1994   SpO2 97%   BMI 34.67 kg/m        Wt Readings from Last 3 Encounters:  03/03/24 214 lb 12.8 oz (97.4 kg)  03/19/23 198 lb (89.8 kg)  03/05/23 198 lb (89.8 kg)    Constitutional:      Appearance: Healthy appearance. Not in distress.  Neck:     Vascular: JVD normal.  Pulmonary:     Breath sounds: Normal breath sounds. No wheezing. No rales.  Cardiovascular:     Normal rate. Regular rhythm.     Murmurs: There is a grade 1/6 systolic murmur at the URSB.  Edema:    Peripheral edema absent.  Abdominal:     Palpations: Abdomen is soft.       Assessment and Plan:    Assessment & Plan Palpitations Recent echocardiogram demonstrated PACs.  I suspect this is what she has been feeling.  She did wear a 5-day ZIO monitor.  Results are still pending.  Labs were obtained with primary care recently.  This included TSH, electrolytes, lipids.  She does have a systolic murmur on exam.  This sounds consistent with aortic valve sclerosis.  She does note that she drinks 2 cups of coffee a day and 1 glass of tea. -Request labs from primary care -Request ZIO monitor results from primary care -Reduce caffeine intake -Propranolol 10 mg daily as needed for palpitations, #30, 2 refills -Follow-up 3 months. Essential hypertension Blood pressure controlled.  Continue amlodipine  5 mg daily, triamterene /HCTZ 37.5/25 mg daily.  If palpitations are not controlled with as needed beta-blocker and reducing caffeine, consider adding long-acting metoprolol succinate. Murmur, cardiac Probable aortic sclerosis.  Given her symptoms of palpitations we will go ahead and proceed with echocardiogram. -Arrange echocardiogram  ADDENDUM: Labs from primary care obtained 02/18/2024 personally reviewed and interpreted by me today 03/03/2024: Hgb 13.8 (normal), platelet count 295,000 (normal), creatinine 0.99 (normal), potassium 4.9 (normal), ALT 27 (normal), TSH 0.50 (normal), LDL 50 (optimal).        Dispo:   Return in about 3 months (around 06/02/2024) for w/ Glendia Ferrier, PA-C.  Signed, Glendia Ferrier, PA-C

## 2024-03-03 ENCOUNTER — Encounter: Payer: Self-pay | Admitting: Physician Assistant

## 2024-03-03 ENCOUNTER — Ambulatory Visit: Attending: Physician Assistant | Admitting: Physician Assistant

## 2024-03-03 VITALS — BP 137/76 | HR 80 | Ht 66.0 in | Wt 214.8 lb

## 2024-03-03 DIAGNOSIS — I10 Essential (primary) hypertension: Secondary | ICD-10-CM | POA: Diagnosis not present

## 2024-03-03 DIAGNOSIS — R002 Palpitations: Secondary | ICD-10-CM

## 2024-03-03 DIAGNOSIS — R011 Cardiac murmur, unspecified: Secondary | ICD-10-CM

## 2024-03-03 DIAGNOSIS — M069 Rheumatoid arthritis, unspecified: Secondary | ICD-10-CM | POA: Diagnosis not present

## 2024-03-03 DIAGNOSIS — E039 Hypothyroidism, unspecified: Secondary | ICD-10-CM | POA: Diagnosis not present

## 2024-03-03 MED ORDER — PROPRANOLOL HCL 10 MG PO TABS: 10.0000 mg | ORAL_TABLET | Freq: Every day | ORAL | 2 refills | Status: AC | PRN

## 2024-03-03 NOTE — Patient Instructions (Signed)
 Medication Instructions:  Your physician has recommended you make the following change in your medication:   START Propranolol 10 mg taking only as needed  *If you need a refill on your cardiac medications before your next appointment, please call your pharmacy*  Lab Work: None ordered  If you have labs (blood work) drawn today and your tests are completely normal, you will receive your results only by: MyChart Message (if you have MyChart) OR A paper copy in the mail If you have any lab test that is abnormal or we need to change your treatment, we will call you to review the results.  Testing/Procedures: Your physician has requested that you have an echocardiogram. Echocardiography is a painless test that uses sound waves to create images of your heart. It provides your doctor with information about the size and shape of your heart and how well your heart's chambers and valves are working. This procedure takes approximately one hour. There are no restrictions for this procedure. Please do NOT wear cologne, perfume, aftershave, or lotions (deodorant is allowed). Please arrive 15 minutes prior to your appointment time.  Please note: We ask at that you not bring children with you during ultrasound (echo/ vascular) testing. Due to room size and safety concerns, children are not allowed in the ultrasound rooms during exams. Our front office staff cannot provide observation of children in our lobby area while testing is being conducted. An adult accompanying a patient to their appointment will only be allowed in the ultrasound room at the discretion of the ultrasound technician under special circumstances. We apologize for any inconvenience.   Follow-Up: At [facility], you and your health needs are our priority.  As part of our continuing mission to provide you with exceptional heart care, our providers are all part of one team.  This team includes your primary Cardiologist (physician)  and Advanced Practice Providers or APPs (Physician Assistants and Nurse Practitioners) who all work together to provide you with the care you need, when you need it.  Your next appointment:   3 month(s)  Provider:   Glendia Ferrier, PA-C          We recommend signing up for the patient portal called MyChart.  Sign up information is provided on this After Visit Summary.  MyChart is used to connect with patients for Virtual Visits (Telemedicine).  Patients are able to view lab/test results, encounter notes, upcoming appointments, etc.  Non-urgent messages can be sent to your provider as well.   To learn more about what you can do with MyChart, go to [email or web address].   Other Instructions

## 2024-03-03 NOTE — Assessment & Plan Note (Signed)
 Blood pressure controlled.  Continue amlodipine  5 mg daily, triamterene /HCTZ 37.5/25 mg daily.  If palpitations are not controlled with as needed beta-blocker and reducing caffeine, consider adding long-acting metoprolol succinate.

## 2024-03-04 DIAGNOSIS — R002 Palpitations: Secondary | ICD-10-CM | POA: Diagnosis not present

## 2024-03-13 DIAGNOSIS — G894 Chronic pain syndrome: Secondary | ICD-10-CM | POA: Diagnosis not present

## 2024-03-13 DIAGNOSIS — Z79891 Long term (current) use of opiate analgesic: Secondary | ICD-10-CM | POA: Diagnosis not present

## 2024-03-13 DIAGNOSIS — M47817 Spondylosis without myelopathy or radiculopathy, lumbosacral region: Secondary | ICD-10-CM | POA: Diagnosis not present

## 2024-03-13 DIAGNOSIS — M1612 Unilateral primary osteoarthritis, left hip: Secondary | ICD-10-CM | POA: Diagnosis not present

## 2024-04-02 DIAGNOSIS — G894 Chronic pain syndrome: Secondary | ICD-10-CM | POA: Diagnosis not present

## 2024-04-02 DIAGNOSIS — M47817 Spondylosis without myelopathy or radiculopathy, lumbosacral region: Secondary | ICD-10-CM | POA: Diagnosis not present

## 2024-04-02 DIAGNOSIS — M069 Rheumatoid arthritis, unspecified: Secondary | ICD-10-CM | POA: Diagnosis not present

## 2024-04-13 ENCOUNTER — Ambulatory Visit (HOSPITAL_COMMUNITY)
Admission: RE | Admit: 2024-04-13 | Discharge: 2024-04-13 | Disposition: A | Discharge status: Home / Self Care | Source: Ambulatory Visit | Attending: Cardiovascular Disease | Admitting: Cardiovascular Disease

## 2024-04-13 DIAGNOSIS — I10 Essential (primary) hypertension: Secondary | ICD-10-CM | POA: Diagnosis not present

## 2024-04-13 DIAGNOSIS — R002 Palpitations: Secondary | ICD-10-CM | POA: Insufficient documentation

## 2024-04-13 DIAGNOSIS — R011 Cardiac murmur, unspecified: Secondary | ICD-10-CM | POA: Insufficient documentation

## 2024-04-13 LAB — ECHOCARDIOGRAM COMPLETE
Area-P 1/2: 4.21 cm2
MV M vel: 5.9 m/s
MV Peak grad: 139.2 mmHg
S' Lateral: 3 cm

## 2024-04-14 ENCOUNTER — Encounter: Payer: Self-pay | Admitting: Physician Assistant

## 2024-04-14 ENCOUNTER — Ambulatory Visit: Payer: Self-pay | Admitting: Physician Assistant

## 2024-04-14 DIAGNOSIS — I340 Nonrheumatic mitral (valve) insufficiency: Secondary | ICD-10-CM | POA: Insufficient documentation

## 2024-05-13 ENCOUNTER — Ambulatory Visit: Admitting: Cardiology
